# Patient Record
Sex: Female | Born: 1955 | Race: White | Hispanic: No | Marital: Married | Smoking: Never smoker
Health system: Southern US, Community
[De-identification: ages and names within clinical notes are randomized; demographics above are authoritative.]

## PROBLEM LIST (undated history)

## (undated) DIAGNOSIS — A4902 Methicillin resistant Staphylococcus aureus infection, unspecified site: Secondary | ICD-10-CM

## (undated) DIAGNOSIS — F32A Depression, unspecified: Secondary | ICD-10-CM

## (undated) DIAGNOSIS — R079 Chest pain, unspecified: Secondary | ICD-10-CM

## (undated) DIAGNOSIS — I10 Essential (primary) hypertension: Secondary | ICD-10-CM

## (undated) DIAGNOSIS — M47812 Spondylosis without myelopathy or radiculopathy, cervical region: Secondary | ICD-10-CM

## (undated) DIAGNOSIS — I209 Angina pectoris, unspecified: Secondary | ICD-10-CM

## (undated) DIAGNOSIS — E785 Hyperlipidemia, unspecified: Secondary | ICD-10-CM

## (undated) DIAGNOSIS — R519 Headache, unspecified: Secondary | ICD-10-CM

## (undated) DIAGNOSIS — B029 Zoster without complications: Secondary | ICD-10-CM

## (undated) DIAGNOSIS — J329 Chronic sinusitis, unspecified: Secondary | ICD-10-CM

## (undated) DIAGNOSIS — E079 Disorder of thyroid, unspecified: Secondary | ICD-10-CM

## (undated) DIAGNOSIS — T7840XA Allergy, unspecified, initial encounter: Secondary | ICD-10-CM

## (undated) DIAGNOSIS — E039 Hypothyroidism, unspecified: Secondary | ICD-10-CM

## (undated) DIAGNOSIS — K219 Gastro-esophageal reflux disease without esophagitis: Secondary | ICD-10-CM

## (undated) DIAGNOSIS — R011 Cardiac murmur, unspecified: Secondary | ICD-10-CM

## (undated) DIAGNOSIS — F419 Anxiety disorder, unspecified: Secondary | ICD-10-CM

## (undated) HISTORY — DX: Zoster without complications: B02.9

## (undated) HISTORY — DX: Spondylosis without myelopathy or radiculopathy, cervical region: M47.812

## (undated) HISTORY — DX: Essential (primary) hypertension: I10

## (undated) HISTORY — PX: OTHER SURGICAL HISTORY: SHX169

## (undated) HISTORY — DX: Disorder of thyroid, unspecified: E07.9

## (undated) HISTORY — DX: Allergy, unspecified, initial encounter: T78.40XA

## (undated) HISTORY — DX: Cardiac murmur, unspecified: R01.1

## (undated) HISTORY — DX: Chronic sinusitis, unspecified: J32.9

---

## 2001-06-16 HISTORY — PX: OTHER SURGICAL HISTORY: SHX169

## 2002-06-16 HISTORY — PX: SPINE SURGERY: SHX786

## 2002-07-18 ENCOUNTER — Encounter: Payer: Self-pay | Admitting: Obstetrics and Gynecology

## 2002-07-18 ENCOUNTER — Ambulatory Visit (HOSPITAL_COMMUNITY): Admission: RE | Admit: 2002-07-18 | Discharge: 2002-07-18 | Payer: Self-pay | Admitting: Obstetrics and Gynecology

## 2002-11-23 ENCOUNTER — Emergency Department (HOSPITAL_COMMUNITY): Admission: EM | Admit: 2002-11-23 | Discharge: 2002-11-23 | Payer: Self-pay | Admitting: Emergency Medicine

## 2003-02-05 ENCOUNTER — Emergency Department (HOSPITAL_COMMUNITY): Admission: EM | Admit: 2003-02-05 | Discharge: 2003-02-05 | Payer: Self-pay | Admitting: Emergency Medicine

## 2003-02-05 ENCOUNTER — Encounter: Payer: Self-pay | Admitting: Emergency Medicine

## 2003-03-02 ENCOUNTER — Encounter: Payer: Self-pay | Admitting: Orthopedic Surgery

## 2003-03-02 ENCOUNTER — Ambulatory Visit (HOSPITAL_COMMUNITY): Admission: RE | Admit: 2003-03-02 | Discharge: 2003-03-02 | Payer: Self-pay | Admitting: Orthopedic Surgery

## 2003-04-17 ENCOUNTER — Inpatient Hospital Stay (HOSPITAL_COMMUNITY): Admission: RE | Admit: 2003-04-17 | Discharge: 2003-04-18 | Payer: Self-pay | Admitting: Neurosurgery

## 2003-05-03 ENCOUNTER — Other Ambulatory Visit: Admission: RE | Admit: 2003-05-03 | Discharge: 2003-05-03 | Payer: Self-pay | Admitting: Obstetrics and Gynecology

## 2003-06-17 HISTORY — PX: CARDIAC CATHETERIZATION: SHX172

## 2003-07-26 ENCOUNTER — Encounter: Admission: RE | Admit: 2003-07-26 | Discharge: 2003-07-26 | Payer: Self-pay | Admitting: Infectious Diseases

## 2003-10-11 ENCOUNTER — Encounter: Admission: RE | Admit: 2003-10-11 | Discharge: 2003-10-11 | Payer: Self-pay | Admitting: Infectious Diseases

## 2003-10-15 ENCOUNTER — Ambulatory Visit (HOSPITAL_COMMUNITY): Admission: RE | Admit: 2003-10-15 | Discharge: 2003-10-15 | Payer: Self-pay | Admitting: Orthopedic Surgery

## 2004-01-08 DIAGNOSIS — C4492 Squamous cell carcinoma of skin, unspecified: Secondary | ICD-10-CM

## 2004-01-08 HISTORY — DX: Squamous cell carcinoma of skin, unspecified: C44.92

## 2004-02-08 ENCOUNTER — Inpatient Hospital Stay (HOSPITAL_COMMUNITY): Admission: EM | Admit: 2004-02-08 | Discharge: 2004-02-09 | Payer: Self-pay | Admitting: *Deleted

## 2004-02-12 ENCOUNTER — Encounter (INDEPENDENT_AMBULATORY_CARE_PROVIDER_SITE_OTHER): Payer: Self-pay | Admitting: *Deleted

## 2004-02-29 ENCOUNTER — Ambulatory Visit (HOSPITAL_COMMUNITY): Admission: RE | Admit: 2004-02-29 | Discharge: 2004-02-29 | Payer: Self-pay | Admitting: Obstetrics and Gynecology

## 2004-02-29 ENCOUNTER — Encounter (INDEPENDENT_AMBULATORY_CARE_PROVIDER_SITE_OTHER): Payer: Self-pay | Admitting: Specialist

## 2004-08-13 ENCOUNTER — Other Ambulatory Visit: Admission: RE | Admit: 2004-08-13 | Discharge: 2004-08-13 | Payer: Self-pay | Admitting: Obstetrics and Gynecology

## 2005-03-03 ENCOUNTER — Ambulatory Visit (HOSPITAL_COMMUNITY): Admission: RE | Admit: 2005-03-03 | Discharge: 2005-03-03 | Payer: Self-pay | Admitting: Emergency Medicine

## 2005-06-16 HISTORY — PX: OVARY SURGERY: SHX727

## 2005-09-30 ENCOUNTER — Ambulatory Visit (HOSPITAL_COMMUNITY): Admission: RE | Admit: 2005-09-30 | Discharge: 2005-09-30 | Payer: Self-pay | Admitting: Neurosurgery

## 2005-10-01 ENCOUNTER — Encounter: Admission: RE | Admit: 2005-10-01 | Discharge: 2005-10-01 | Payer: Self-pay | Admitting: Occupational Medicine

## 2006-02-12 ENCOUNTER — Emergency Department (HOSPITAL_COMMUNITY): Admission: EM | Admit: 2006-02-12 | Discharge: 2006-02-12 | Payer: Self-pay | Admitting: Emergency Medicine

## 2006-06-29 ENCOUNTER — Ambulatory Visit (HOSPITAL_COMMUNITY): Admission: RE | Admit: 2006-06-29 | Discharge: 2006-06-29 | Payer: Self-pay | Admitting: Emergency Medicine

## 2007-01-24 ENCOUNTER — Emergency Department (HOSPITAL_COMMUNITY): Admission: EM | Admit: 2007-01-24 | Discharge: 2007-01-24 | Payer: Self-pay | Admitting: Emergency Medicine

## 2007-04-26 ENCOUNTER — Ambulatory Visit (HOSPITAL_COMMUNITY): Admission: RE | Admit: 2007-04-26 | Discharge: 2007-04-26 | Payer: Self-pay | Admitting: Family Medicine

## 2007-04-26 ENCOUNTER — Emergency Department (HOSPITAL_COMMUNITY): Admission: EM | Admit: 2007-04-26 | Discharge: 2007-04-26 | Payer: Self-pay | Admitting: Emergency Medicine

## 2007-05-30 ENCOUNTER — Ambulatory Visit (HOSPITAL_COMMUNITY): Admission: RE | Admit: 2007-05-30 | Discharge: 2007-05-30 | Payer: Self-pay | Admitting: Otolaryngology

## 2007-07-22 DIAGNOSIS — D229 Melanocytic nevi, unspecified: Secondary | ICD-10-CM

## 2007-07-22 HISTORY — DX: Melanocytic nevi, unspecified: D22.9

## 2007-09-14 ENCOUNTER — Emergency Department (HOSPITAL_COMMUNITY): Admission: EM | Admit: 2007-09-14 | Discharge: 2007-09-14 | Payer: Self-pay | Admitting: Emergency Medicine

## 2007-12-07 ENCOUNTER — Emergency Department (HOSPITAL_COMMUNITY): Admission: EM | Admit: 2007-12-07 | Discharge: 2007-12-08 | Payer: Self-pay | Admitting: Emergency Medicine

## 2007-12-08 ENCOUNTER — Encounter (INDEPENDENT_AMBULATORY_CARE_PROVIDER_SITE_OTHER): Payer: Self-pay | Admitting: *Deleted

## 2009-02-13 ENCOUNTER — Encounter: Payer: Self-pay | Admitting: Internal Medicine

## 2009-04-04 ENCOUNTER — Emergency Department (HOSPITAL_COMMUNITY): Admission: EM | Admit: 2009-04-04 | Discharge: 2009-04-04 | Payer: Self-pay | Admitting: Family Medicine

## 2009-04-10 ENCOUNTER — Ambulatory Visit (HOSPITAL_COMMUNITY): Admission: RE | Admit: 2009-04-10 | Discharge: 2009-04-10 | Payer: Self-pay | Admitting: Neurosurgery

## 2010-05-21 ENCOUNTER — Ambulatory Visit (HOSPITAL_COMMUNITY)
Admission: RE | Admit: 2010-05-21 | Discharge: 2010-05-21 | Payer: Self-pay | Source: Home / Self Care | Admitting: Neurosurgery

## 2010-07-07 ENCOUNTER — Encounter: Payer: Self-pay | Admitting: Internal Medicine

## 2010-07-07 ENCOUNTER — Encounter: Payer: Self-pay | Admitting: Neurosurgery

## 2010-07-07 ENCOUNTER — Encounter: Payer: Self-pay | Admitting: *Deleted

## 2010-07-15 ENCOUNTER — Ambulatory Visit (HOSPITAL_COMMUNITY)
Admission: RE | Admit: 2010-07-15 | Discharge: 2010-07-15 | Payer: Self-pay | Source: Home / Self Care | Attending: Emergency Medicine | Admitting: Emergency Medicine

## 2010-09-06 ENCOUNTER — Other Ambulatory Visit: Payer: Self-pay | Admitting: Gastroenterology

## 2010-09-06 ENCOUNTER — Ambulatory Visit (HOSPITAL_COMMUNITY)
Admission: RE | Admit: 2010-09-06 | Discharge: 2010-09-06 | Disposition: A | Discharge status: Home / Self Care | Payer: 59 | Source: Ambulatory Visit | Attending: Gastroenterology | Admitting: Gastroenterology

## 2010-09-06 DIAGNOSIS — Z85528 Personal history of other malignant neoplasm of kidney: Secondary | ICD-10-CM | POA: Insufficient documentation

## 2010-09-06 DIAGNOSIS — Z1211 Encounter for screening for malignant neoplasm of colon: Secondary | ICD-10-CM | POA: Insufficient documentation

## 2010-09-06 DIAGNOSIS — K644 Residual hemorrhoidal skin tags: Secondary | ICD-10-CM | POA: Insufficient documentation

## 2010-09-06 DIAGNOSIS — K648 Other hemorrhoids: Secondary | ICD-10-CM | POA: Insufficient documentation

## 2010-09-06 DIAGNOSIS — E785 Hyperlipidemia, unspecified: Secondary | ICD-10-CM | POA: Insufficient documentation

## 2010-09-06 DIAGNOSIS — D126 Benign neoplasm of colon, unspecified: Secondary | ICD-10-CM | POA: Insufficient documentation

## 2010-09-06 DIAGNOSIS — Z79899 Other long term (current) drug therapy: Secondary | ICD-10-CM | POA: Insufficient documentation

## 2010-09-06 DIAGNOSIS — Z87828 Personal history of other (healed) physical injury and trauma: Secondary | ICD-10-CM | POA: Insufficient documentation

## 2010-09-06 DIAGNOSIS — Z880 Allergy status to penicillin: Secondary | ICD-10-CM | POA: Insufficient documentation

## 2010-09-06 DIAGNOSIS — Z7982 Long term (current) use of aspirin: Secondary | ICD-10-CM | POA: Insufficient documentation

## 2010-09-06 DIAGNOSIS — I10 Essential (primary) hypertension: Secondary | ICD-10-CM | POA: Insufficient documentation

## 2010-09-19 LAB — RHEUMATOID FACTOR: Rhuematoid fact SerPl-aCnc: <20 IU/mL (ref 0–20)

## 2010-09-19 LAB — CBC
HCT: 42.5 % (ref 36.0–46.0)
Hemoglobin: 14.7 g/dL (ref 12.0–15.0)
MCHC: 34.7 g/dL (ref 30.0–36.0)
MCV: 90 fL (ref 78.0–100.0)
Platelets: 311 10*3/uL (ref 150–400)
RBC: 4.72 MIL/uL (ref 3.87–5.11)
RDW: 13 % (ref 11.5–15.5)
WBC: 8.4 10*3/uL (ref 4.0–10.5)

## 2010-09-19 LAB — COMPREHENSIVE METABOLIC PANEL
ALT: 22 U/L (ref 0–35)
AST: 26 U/L (ref 0–37)
Albumin: 5.2 g/dL (ref 3.5–5.2)
Alkaline Phosphatase: 80 U/L (ref 39–117)
BUN: 16 mg/dL (ref 6–23)
CO2: 32 mEq/L (ref 19–32)
Calcium: 10.2 mg/dL (ref 8.4–10.5)
Chloride: 96 mEq/L (ref 96–112)
Creatinine, Ser: 1.19 mg/dL (ref 0.4–1.2)
GFR calc Af Amer: 58 mL/min — ABNORMAL LOW (ref >60)
GFR calc non Af Amer: 48 mL/min — ABNORMAL LOW (ref >60)
Glucose, Bld: 97 mg/dL (ref 70–99)
Potassium: 3.1 mEq/L — ABNORMAL LOW (ref 3.5–5.1)
Sodium: 140 mEq/L (ref 135–145)
Total Bilirubin: 0.7 mg/dL (ref 0.3–1.2)
Total Protein: 8.4 g/dL — ABNORMAL HIGH (ref 6.0–8.3)

## 2010-09-19 LAB — SEDIMENTATION RATE: Sed Rate: 6 mm/hr (ref 0–22)

## 2010-09-19 LAB — RPR: RPR Ser Ql: NONREACTIVE

## 2010-09-19 LAB — URIC ACID: Uric Acid, Serum: 6.9 mg/dL (ref 2.4–7.0)

## 2010-11-01 NOTE — Discharge Summary (Signed)
NAME:  Amber Werner, Amber Werner                        ACCOUNT NO.:  [id number]   MEDICAL RECORD NO.:  [id number]                   PATIENT TYPE:  INP   LOCATION:  3731                                 FACILITY:  [facility]   PHYSICIAN:  Thereasa Solo. Little, M.D.              DATE OF BIRTH:  06/12/1956   DATE OF ADMISSION:  02/08/2004  DATE OF DISCHARGE:  02/09/2004                                 DISCHARGE SUMMARY   ADMISSION DIAGNOSES:  1. Chest pain of questionable etiology.  2. History of negative Cardiolite in October of 2004.  3. 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  4. Hyperlipidemia with statin intolerance.  5. History of hypothyroidism.  6. Hypertension.  7. Chronic sinusitis.   DISCHARGE DIAGNOSES:  1. Chest pain of questionable etiology.  2. History of negative Cardiolite in October of 2004.  3. 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  4. Hyperlipidemia with statin intolerance.  5. History of hypothyroidism.  6. Hypertension.  7. Chronic sinusitis.  8. Status post cardiac catheterization on February 09, 2004 revealing no     significant coronary artery disease.   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  She follows with Dr.  Clarene Duke, and has a history of a negative Cardiolite in October of 2004, but  has continued to have intermittent chest pain.  She does have positive risk  factors for hyperlipidemia with statin intolerance, as well as some  hypertension.  As well, she occasionally smokes.  She was seen in the ER on  February 08, 2004 because of chest pain, which had been intermittent for 2  weeks, lasting 5-10 minutes each episode, and was worse with stress.  There  was some radiation to the left arm and neck.  She had been at her OB/GYN,  Dr. Rana Snare, earlier that day, and was sent from his office to the ER for  further evaluation.   PHYSICAL EXAMINATION:  VITAL  SIGNS:  Blood pressure was initially elevated  at 164/74, pulse 70.  HEART:  Regular rhythm without murmur, rub, or gallop.  There was no other significant abnormality on physical exam.  She was  evaluated by Dr. Jacinto Halim, who felt the physical exam was unremarkable.   EKG was without significant abnormality.  At that point, though, given her  multiple risk factors, her ongoing intermittent chest pain despite negative  Cardiolite, it was felt that we should proceed with definitive cardiac  catheterization.  She was placed on heparin, nitroglycerin, until we would  obtain a cath.  We would check serial enzymes, and follow up EKG.   On the morning of February 09, 2004, she was rounded on by Dr. Susa Griffins.   HOSPITAL COURSE:  On the morning of February 09, 2004, she was seen by Dr.  Susa Griffins on morning  rounds.  It was felt that her chest pain was  very atypical, and it was felt that she had a lot of anxiety.  Nonetheless,  we plan to go ahead and proceed with definitive cath, given her history of  Cardiolite and ongoing pain.  At that point, her enzymes were negative x2.  D-dimer is negative.  __________ the labs looked good.   Later on in the day of February 09, 2004, she underwent cardiac  catheterization by Dr. Yates Decamp.  She had no significant CAD.  She did have  some catheter-induced spasm of the RCA relieved with IV nitroglycerin.  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  The EF was 50%.  She tolerated the procedure without  complications.   She was discharged home later than afternoon.  At that point, she was  hemodynamically stable.  The groin was stable without hematoma or bleed.   HOSPITAL CONSULTS:  None.   HOSPITAL PROCEDURES:  Cardiac catheterization on February 09, 2004 by Dr. Yates Decamp.  She was found to have normal coronary arteries.  She did have some  catheter-induced spasm of the RCA, which was  relieved with IV nitroglycerin.  As well, she had some slow filling of the coronary arteries.   LABORATORY DATA:  On February 09, 2004, the white count was 8.2, hemoglobin  12.9, hematocrit 37.2, platelets 349.  Urinalysis is normal.  TSH normal at  2.721, INR of 0.9, D-dimer 0.29.  Sodium was 139, potassium 3.6, BUN 11,  creatinine 1.1.  LFT's are normal.  Cardiac enzymes are negative x2 with  CK's of 126, 97, CK-MB 1.3, 0.9.  Troponin is 0.01 x2.   RADIOLOGY:  The printed out report is not attached to the chart at this time  of dictation, and is pending.   EKG:  Normal sinus rhythm, 66 beats per minute.  No significant abnormality.  No ST-T change.   DISCHARGE MEDICATIONS:  1. Hyzaar 50/12.5 mg once a day.  She is to stop this.  2. Synthroid 0.125 mg once a day.  3. Zyrtec as before.  4. Singulair as before.  5. Zithromax 250 mg once a day for 3 more days.  6. Norvasc 2.5 mg a day, which Dr. Jacinto Halim started new.  7. Cozaar 25 mg one a day at night.  (Again, she was told to stop her Hyzaar.  Dr. Jacinto Halim made these medication  changes, based on her catheterization findings, and possible microvascular  disease, and catheter-induced spasm.)   ACTIVITY:  No strenuous activity, lifting greater than 5 pounds, driving, or  sexual activity for 3 days.   DIET:  Low cholesterol diet.   WOUND CARE:  Cleanse groin site with warm water and soap.   Call [phone] for any bleeding or increased redness in the groin site.   FOLLOW UP:  Follow up with Dr. Clarene Duke on March 20, 2004 at 9:15 a.m.      Mary B. Easley, P.A.-C.                   Thereasa Solo. Little, M.D.    MBE/MEDQ  D:  02/12/2004  T:  02/12/2004  Job:  [id number]

## 2010-11-01 NOTE — Op Note (Signed)
NAME:  Amber Werner, Amber Werner                        ACCOUNT NO.:  [id number]   MEDICAL RECORD NO.:  [id number]                   PATIENT TYPE:  INP   LOCATION:  3021                                 FACILITY:  [facility]   PHYSICIAN:  Cristi Loron, M.D.            DATE OF BIRTH:  1955-11-14   DATE OF PROCEDURE:  04/18/2003  DATE OF DISCHARGE:                                 OPERATIVE REPORT   PREOPERATIVE DIAGNOSES:  C5-6 and C6-7 herniated nucleus pulposus,  spondylosis, stenosis, cervical radiculopathy, cervicalgia.   POSTOPERATIVE DIAGNOSES:  C5-6 and C6-7 herniated nucleus pulposus,  spondylosis, stenosis, cervical radiculopathy, cervicalgia.   PROCEDURES:  1. C5-6 and C6-7 extensive anterior cervical diskectomy/decompression.  2. Interbody iliac crest allograft arthrodesis.  3. Anterior cervical plating (Synthes titanium plate and screws).   SURGEON:  Cristi Loron, M.D.   ASSISTANT:  Payton Doughty, M.D.   ANESTHESIA:  General endotracheal.   ESTIMATED BLOOD LOSS:  150 mL.   SPECIMENS:  None.   DRAINS:  None.   COMPLICATIONS:  None.   BRIEF HISTORY:  The patient is a 55 year old white female who has suffered  from neck and right arm pain.  She failed medical management and was worked  up with a cervical MRI, which demonstrated a herniated disk and spondylosis  at C5-6 and C6-7.  I discussed the various treatment options with her,  including surgery.  The patient weighed the risks, benefits, and  alternatives of surgery and decided to proceed with a C5-6 and C6-7 anterior  cervical diskectomy, fusion, and plating.   DESCRIPTION OF PROCEDURE:  The patient was brought to the operating room by  the anesthesia team.  General endotracheal anesthesia was induced.  The  patient remained in the supine position.  A roll was placed under her  shoulders to place her neck in slight extension.  We then prepared the  anterior cervical region using Betadine scrub with Betadine  solution.  Sterile drapes were applied.  I then injected the area to be incised with  Marcaine with epinephrine solution and used a scalpel to make a transverse  incision in the patient's left anterior neck.  I used the Metzenbaum  scissors to divide the patient's platysma muscle and then to dissect medial  to the sternocleidomastoid muscle, jugular vein, and carotid artery.  I  bluntly dissected down toward the anterior cervical spine and carefully  identified the esophagus and retracted it medially.  I then cleared soft  tissue from the anterior cervical spine using Kitner swabs and then inserted  a bent spinal needle into the upper exposed interspace.  I then obtained the  intraoperative radiograph to confirm our location.   We began at C5-6.  We incised the C5-6 intervertebral disk space and then  performed a partial diskectomy using the pituitary forceps and the Karlin  curettes.  We inserted distraction screws at C5 and C6 and then distracted  the interspace.  The interspace was quite spondylitic and narrow.  We used a  high-speed drill to decorticate the vertebral end plates at Z5-6, to drill  away the remainder of the C5-6 intervertebral disk, to thin out the  posterior longitudinal ligament, and to drill away some posterior  spondylosis.  We then incised the thinned ligament with an arachnoid knife  and then removed it with the Kerrison punch, undercutting the vertebral end  plates at L8-7, decompressing the thecal sac, and then performed a  foraminotomy about the bilateral C6 nerve roots, completing the  decompression at this level.   We then repeated this procedure in an analogous fashion at C6-7, performing  our decompression at this level and then decompressing the thecal sac and  bilateral C7 nerve roots.   Having completed the decompression, we now turned our attention to  arthrodesis.  We obtained iliac crest tricortical allograft bone graft and  fashioned it to these  approximate dimensions, 7 mm in height and 1 cm in  depth.  We inserted one bone graft into the distracted C6-7 interspace and  then removed the distraction screw from C7, placed it back into C5,  distracted the C5-6 interspace, and then placed a second bone graft in the  C5-6 interspace.  We removed the distraction screws.  There was a good snug  fit of the bone graft at both levels.   We now turned our attention to the anterior spinal instrumentation.  We  drilled away some ventral spondylosis so that the plate would lay down flat  and then obtained the appropriate length Synthes titanium plate.  We laid it  along the anterior aspect of the vertebral bodies from C5 to C7.  We drilled  two holes in C5, C6, and C7, tapped the holes, and then secured the plate to  the vertebral bodies by placing two 12 mm screws at C5, C6, and C7.  We then  obtained the intraoperative that demonstrated good position of the plate,  screws, and interbody graft, and then secured the screws to the plate by  placing a locking screw at each screw.   We then obtained stringent hemostasis using bipolar electrocautery and  copiously irrigated the wound out with bacitracin solution.  We removed the  solution, then removed the Caspar self-retaining retractor.  We then  inspected the esophagus for any damage and there was none apparent.  We then  reapproximated the patient's platysma muscle with interrupted 3-0 Vicryl  suture, the subcutaneous tissue with interrupted 3-0 Vicryl suture, and the  skin with Steri-Strips and Benzoin.  The wound was then coated with  bacitracin ointment, a sterile dressing was applied, the drapes were  removed, and the patient was subsequently extubated by the anesthesia team  and transported to the postanesthesia care unit in stable condition.  All  sponge, instrument, and needle counts were correct at the end of this case.                                              Cristi Loron, M.D.    JDJ/MEDQ  D:  04/17/2003  T:  04/18/2003  Job:  [id number]

## 2010-11-01 NOTE — Op Note (Signed)
NAME:  Amber Werner, Amber Werner                        ACCOUNT NO.:  [id number]   MEDICAL RECORD NO.:  [id number]                   PATIENT TYPE:  AMB   LOCATION:  SDC                                  FACILITY:  [facility]   PHYSICIAN:  Dineen Kid. Rana Snare, M.D.                 DATE OF BIRTH:  12-25-1955   DATE OF PROCEDURE:  02/29/2004  DATE OF DISCHARGE:                                 OPERATIVE REPORT   PREOPERATIVE DIAGNOSIS:  Left lower quadrant pain and history of  endometriosis.   POSTOPERATIVE DIAGNOSIS:  Left lower quadrant pain, endometriosis and pelvic  adhesions.   OPERATION PERFORMED:  Laparoscopy with lysis of adhesions, left salpingo-  oophorectomy and ablation of endometriosis implants.   SURGEON:  Dineen Kid. Rana Snare, M.D.   ANESTHESIA:  General endotracheal.   INDICATIONS FOR PROCEDURE:  Ms. Taddeo is a 54 year old G3, P2, A1 with  worsening left lower quadrant pain.  The pain has gotten so severe it starts  radiating down her left anterior thigh.  She does have a remote history of  endometriosis.  She had an ovarian cyst approximately six months ago and  recent ultrasound shows a question of hydrosalpinx.  Because the pain is  disabling, she is currently on Vicodin for this, she desires to get a  surgical evaluation and treatment of this.  Plan laparoscopy with removal of  the left tube and ovary and possible ablation of endometriosis implants.  Risks and benefits were discussed at length, informed consent was obtained.  See history and physical for further details.   FINDINGS AT TIME OF SURGERY:  Normal-appearing liver, appendix.  Normal-  appearing right tube, ovary.  Normal-appearing cul-de-sac and uterus.  The  left ovarian fossa has several small endometrious implants.  The left ovary  and left fallopian tube are densely adhered with the fallopian tube flipped  over the top and adheres with the fimbriated, adhered to the round ligament  where the midportion of the tube is  adhered to the pelvic sidewall.   DESCRIPTION OF PROCEDURE:  After adequate analgesia, the patient was placed  in dorsal lithotomy position.  She was sterilely prepped and draped.  The  bladder was sterilely drained.  A Hulka tenaculum was placed on the cervix.  A 1 cm infraumbilical skin incision was made.  A Veress needle was inserted.  The abdomen was insufflated with dullness to percussion.  A 5 mm trocar was  inserted to the left of the midline and an 11 mm trocar was inserted through  the umbilical incision.  The above findings were noted.  A Gyrus tripolar  LigaSure was used to dissect the adhesions from the left pelvic sidewall and  descending colon, dissecting this from the sidewall across the mesosalpinx  through the round ligament with this dissected free from the round ligament.  The infundibulopelvic ligament was ligated using bipolar cautery and  dissected the ovary from  the infundibulopelvic ligament.  The utero-ovarian  ligament was then dissected down across the fallopian tube removing the  fallopian tube and the ovary with good hemostasis achieved and all of the  adhesions from the tube and ovary also removed.  Endometrious implants in  the left ovarian fossa were then cauterized using the bipolar cautery with  good thermal burn noted through the entirety of the peritoneal window.  This  was performed until no endometrious implants were remaining.  After  irrigation was applied and re-examination of the pedicles and endometrious  implants, they all appeared to be hemostatic and no residual disease was  noted. The ovary was removed through the umbilical incision after  morselization.  The trocar was then removed.  The infraumbilical skin  incision was closed with a 0 Vicryl interrupted suture in the fascia, 3-0  Vicryl Rapide subcuticular stitch.  The 5 mm site was closed with a 3-0  Vicryl Rapide subcuticular stitch and horizontal mattress.  The incisions  were injected  with 0.25% Marcaine, 10 mL total used.  The tenaculum removed  from the cervix and noted to be hemostatic. The patient was transferred to  the recovery room in stable condition.  Sponge, needle and instrument counts  were normal.  The estimated blood loss was less than 10 mL.  The patient did  receive Cipro 400 mg IV preoperatively.   DISPOSITION:  The patient will be discharged to home with routine  instruction sheet for laparoscopy.  She is sent home with a prescription for  Vicodin #30 and told to return for increased pain, fever or bleeding.  She  will follow up in the office in two to three weeks.                                               Dineen Kid Rana Snare, M.D.    DCL/MEDQ  D:  02/29/2004  T:  02/29/2004  Job:  [id number]

## 2010-11-01 NOTE — H&P (Signed)
NAME:  GIFT, RUECKERT                        ACCOUNT NO.:  [id number]   MEDICAL RECORD NO.:  [id number]                   PATIENT TYPE:  AMB   LOCATION:  SDC                                  FACILITY:  [facility]   PHYSICIAN:  Dineen Kid. Rana Snare, M.D.                 DATE OF BIRTH:  15-Jan-1956   DATE OF ADMISSION:  02/29/2004  DATE OF DISCHARGE:                                HISTORY & PHYSICAL   HISTORY OF PRESENT ILLNESS:  Ms. Southgate is a 55 year old G3, P2, A1, with  left lower quadrant pain and pressure.  She has been working in the  emergency room recently and states the pain is getting very severe, has pain  radiating down her left anterior thigh more in the obturator region.  She  does not have a guarding or rebound or anorexia.  She does have a strong  family history of heart attack and is currently on Hyzaar and Lasix and just  recently did have an EKG in the emergency room which showed some ischemic  changes and has followed up with him for that.  An ultrasound shows no  uterine masses, no fluid in the cul-de-sac.  Left ovary has no adnexal  masses as well; however, there is a questionable left hydrosalpinx  approximately 6 mm in size.  The right ovary is also within normal limits.  She also has a history of an ovarian cystectomy and endometriosis at 55  years of age during laparoscopy.   PAST MEDICAL HISTORY:  1.  Significant for hypertension.  2.  Heart disease.  3.  Hypothyroidism.  4.  Anxiety.   ALLERGIES:  She has allergies to PENICILLIN which cause anaphylaxis  reaction.   MEDICATIONS:  1.  Currently she is on Vicodin.  2.  Synthroid 125.  3.  Hyzaar 12.5/50.  4.  Lasix at 40 mg.  5.  Diazepam as needed.  6.  Percocet as needed.  7.  Prevacid as needed.   PHYSICAL EXAMINATION:  VITAL SIGNS:  Blood pressure is 132/78, heart regular  rate and rhythm.  LUNGS:  Clear to auscultation bilaterally.  ABDOMEN:  Nondistended, nontender.  No rebound, no guarding.  PELVIC:   Uterus is anteverted and mobile, nontender.  Some mild tenderness  in the left adnexal area.  No uterosacral nodularity is palpable.   LABORATORY DATA:  Ultrasound from six months ago showed a left ovarian  hemorrhagic cyst.  Recent ultrasound in August shows left hydrosalpinx and  no fluid in the cul-de-sac.   IMPRESSION/PLAN:  Left lower quadrant pain, questionable hydrosalpinx versus  endometriosis.  The patient does have a history of endometriosis.  Because  the pain is more severe and getting worse, I have recommended surgical  evaluation of this.  The patient absolutely agrees and would like to proceed  as soon as possible.  I gave her a prescription for Vicodin to take as  needed.  We discussed the risks and benefits of surgery at length.  We plan  laparoscopic left salpingo-oophorectomy with possible lysis of adhesions or  ablation of endometriosis.  __________ at the time of surgery, if there is a  significant amount of pelvic disease, we may close her and recommend  hysterectomy and schedule that at a later time.  We did discuss the risks  and benefits of surgery at length, which include, but are not limited to,  risks of infection, bleeding, damage to uterus, tubes, ovaries, bowel,  bladder, possibility of not being to alleviate the pain, worsening of the  pain, or risks associated with general anesthesia or blood transfusions.  She does give her informed consent.  She is menopausal based upon blood  tests on January 10, 2004.  She is currently not on hormone replacement therapy  and really not a good candidate for this.                                               Dineen Kid Rana Snare, M.D.    DCL/MEDQ  D:  02/28/2004  T:  02/28/2004  Job:  [id number]

## 2011-01-06 ENCOUNTER — Other Ambulatory Visit (HOSPITAL_COMMUNITY): Payer: Self-pay | Admitting: Neurosurgery

## 2011-01-06 DIAGNOSIS — R51 Headache: Secondary | ICD-10-CM

## 2011-01-09 ENCOUNTER — Other Ambulatory Visit (HOSPITAL_COMMUNITY): Payer: 59

## 2011-01-17 ENCOUNTER — Other Ambulatory Visit (HOSPITAL_COMMUNITY): Payer: 59

## 2011-03-07 ENCOUNTER — Other Ambulatory Visit (HOSPITAL_COMMUNITY): Payer: Self-pay | Admitting: Family Medicine

## 2011-03-07 DIAGNOSIS — R1011 Right upper quadrant pain: Secondary | ICD-10-CM

## 2011-03-10 LAB — DIFFERENTIAL
Basophils Absolute: 0
Basophils Relative: 0
Eosinophils Absolute: 0.3
Eosinophils Relative: 4
Lymphocytes Relative: 39
Lymphs Abs: 3.1
Monocytes Absolute: 0.7
Monocytes Relative: 9
Neutro Abs: 3.8
Neutrophils Relative %: 48

## 2011-03-10 LAB — BASIC METABOLIC PANEL
BUN: 14
CO2: 25
Calcium: 9.2
Chloride: 104
Creatinine, Ser: 0.93
GFR calc Af Amer: >60
GFR calc non Af Amer: >60
Glucose, Bld: 81
Potassium: 4.5
Sodium: 138

## 2011-03-10 LAB — CBC
HCT: 37.5
Hemoglobin: 13
MCHC: 34.8
MCV: 87.3
Platelets: 276
RBC: 4.3
RDW: 13.4
WBC: 7.9

## 2011-03-19 ENCOUNTER — Ambulatory Visit (HOSPITAL_COMMUNITY)
Admission: RE | Admit: 2011-03-19 | Discharge: 2011-03-19 | Disposition: A | Discharge status: Home / Self Care | Payer: 59 | Source: Ambulatory Visit | Attending: Family Medicine | Admitting: Family Medicine

## 2011-03-19 DIAGNOSIS — R1011 Right upper quadrant pain: Secondary | ICD-10-CM | POA: Insufficient documentation

## 2011-07-16 ENCOUNTER — Other Ambulatory Visit: Payer: Self-pay

## 2011-09-12 ENCOUNTER — Other Ambulatory Visit: Payer: Self-pay | Admitting: Physician Assistant

## 2011-09-12 ENCOUNTER — Telehealth: Payer: Self-pay

## 2011-09-12 DIAGNOSIS — I10 Essential (primary) hypertension: Secondary | ICD-10-CM

## 2011-09-12 DIAGNOSIS — E039 Hypothyroidism, unspecified: Secondary | ICD-10-CM

## 2011-09-12 DIAGNOSIS — J309 Allergic rhinitis, unspecified: Secondary | ICD-10-CM

## 2011-09-12 DIAGNOSIS — F419 Anxiety disorder, unspecified: Secondary | ICD-10-CM

## 2011-09-12 DIAGNOSIS — F430 Acute stress reaction: Secondary | ICD-10-CM

## 2011-09-12 MED ORDER — LEVOTHYROXINE SODIUM 150 MCG PO TABS: 150.0000 ug | ORAL_TABLET | Freq: Every day | ORAL | Status: DC

## 2011-09-12 MED ORDER — FLUTICASONE PROPIONATE 50 MCG/ACT NA SUSP: 2.0000 | Freq: Every day | NASAL | Status: DC

## 2011-09-12 MED ORDER — ZOLPIDEM TARTRATE 10 MG PO TABS: 10.0000 mg | ORAL_TABLET | Freq: Every evening | ORAL | Status: DC | PRN

## 2011-09-12 MED ORDER — MONTELUKAST SODIUM 10 MG PO TABS: 10.0000 mg | ORAL_TABLET | Freq: Every day | ORAL | Status: DC

## 2011-09-12 MED ORDER — FUROSEMIDE 40 MG PO TABS: 40.0000 mg | ORAL_TABLET | Freq: Every day | ORAL | Status: DC

## 2011-09-12 MED ORDER — ALPRAZOLAM 0.25 MG PO TABS: 0.2500 mg | ORAL_TABLET | Freq: Two times a day (BID) | ORAL | Status: AC | PRN

## 2011-09-12 MED ORDER — LOSARTAN POTASSIUM-HCTZ 50-12.5 MG PO TABS: 1.0000 | ORAL_TABLET | Freq: Every day | ORAL | Status: DC

## 2011-09-12 NOTE — Telephone Encounter (Signed)
Pt called and states [facility] pharmacy has faxed over refill requests for her meds three times. Chelle refilled meds for one month and pt transferred to appts to make appt with Dr. Audria Nine.

## 2011-09-15 ENCOUNTER — Other Ambulatory Visit: Payer: Self-pay | Admitting: Family Medicine

## 2011-11-05 ENCOUNTER — Ambulatory Visit (INDEPENDENT_AMBULATORY_CARE_PROVIDER_SITE_OTHER): Payer: 59 | Admitting: Family Medicine

## 2011-11-05 ENCOUNTER — Encounter: Payer: Self-pay | Admitting: Family Medicine

## 2011-11-05 VITALS — BP 124/82 | HR 77 | Temp 97.8°F | Resp 16 | Ht 63.5 in | Wt 159.0 lb

## 2011-11-05 DIAGNOSIS — J309 Allergic rhinitis, unspecified: Secondary | ICD-10-CM

## 2011-11-05 DIAGNOSIS — G4700 Insomnia, unspecified: Secondary | ICD-10-CM

## 2011-11-05 DIAGNOSIS — I10 Essential (primary) hypertension: Secondary | ICD-10-CM

## 2011-11-05 DIAGNOSIS — Z13220 Encounter for screening for lipoid disorders: Secondary | ICD-10-CM

## 2011-11-05 DIAGNOSIS — E039 Hypothyroidism, unspecified: Secondary | ICD-10-CM

## 2011-11-05 DIAGNOSIS — Z0000 Encounter for general adult medical examination without abnormal findings: Secondary | ICD-10-CM

## 2011-11-05 LAB — COMPREHENSIVE METABOLIC PANEL
ALT: 18 U/L (ref 0–35)
AST: 23 U/L (ref 0–37)
Albumin: 4.7 g/dL (ref 3.5–5.2)
Alkaline Phosphatase: 81 U/L (ref 39–117)
BUN: 17 mg/dL (ref 6–23)
CO2: 30 mEq/L (ref 19–32)
Calcium: 9.8 mg/dL (ref 8.4–10.5)
Chloride: 99 mEq/L (ref 96–112)
Creat: 1.08 mg/dL (ref 0.50–1.10)
Glucose, Bld: 79 mg/dL (ref 70–99)
Potassium: 4 mEq/L (ref 3.5–5.3)
Sodium: 139 mEq/L (ref 135–145)
Total Bilirubin: 0.5 mg/dL (ref 0.3–1.2)
Total Protein: 7.4 g/dL (ref 6.0–8.3)

## 2011-11-05 LAB — LIPID PANEL
Cholesterol: 281 mg/dL — ABNORMAL HIGH (ref 0–200)
HDL: 74 mg/dL (ref >39)
LDL Cholesterol: 181 mg/dL — ABNORMAL HIGH (ref 0–99)
Total CHOL/HDL Ratio: 3.8 Ratio
Triglycerides: 130 mg/dL (ref <150)
VLDL: 26 mg/dL (ref 0–40)

## 2011-11-05 LAB — POCT URINALYSIS DIPSTICK
Bilirubin, UA: NEGATIVE
Blood, UA: NEGATIVE
Glucose, UA: NEGATIVE
Ketones, UA: NEGATIVE
Leukocytes, UA: NEGATIVE
Nitrite, UA: NEGATIVE
Protein, UA: NEGATIVE
Spec Grav, UA: 1.01
Urobilinogen, UA: 0.2
pH, UA: 5

## 2011-11-05 LAB — T4, FREE: Free T4: 1.23 ng/dL (ref 0.80–1.80)

## 2011-11-05 LAB — TSH: TSH: 2.008 u[IU]/mL (ref 0.350–4.500)

## 2011-11-05 MED ORDER — FLUTICASONE PROPIONATE 50 MCG/ACT NA SUSP: NASAL | Status: DC

## 2011-11-05 MED ORDER — FUROSEMIDE 40 MG PO TABS: 40.0000 mg | ORAL_TABLET | Freq: Every day | ORAL | Status: DC

## 2011-11-05 MED ORDER — ESOMEPRAZOLE MAGNESIUM 40 MG PO CPDR: 40.0000 mg | DELAYED_RELEASE_CAPSULE | Freq: Every day | ORAL | Status: DC

## 2011-11-05 MED ORDER — MONTELUKAST SODIUM 10 MG PO TABS: 10.0000 mg | ORAL_TABLET | Freq: Every day | ORAL | Status: DC

## 2011-11-05 MED ORDER — LEVOTHYROXINE SODIUM 150 MCG PO TABS: 150.0000 ug | ORAL_TABLET | Freq: Every day | ORAL | Status: DC

## 2011-11-05 MED ORDER — ZOLPIDEM TARTRATE 10 MG PO TABS: 10.0000 mg | ORAL_TABLET | Freq: Every evening | ORAL | Status: DC | PRN

## 2011-11-05 MED ORDER — LOSARTAN POTASSIUM-HCTZ 50-12.5 MG PO TABS: 1.0000 | ORAL_TABLET | Freq: Every day | ORAL | Status: DC

## 2011-11-05 MED ORDER — DIAZEPAM 5 MG PO TABS: 5.0000 mg | ORAL_TABLET | Freq: Four times a day (QID) | ORAL | Status: DC | PRN

## 2011-11-05 NOTE — Patient Instructions (Signed)

## 2011-11-09 ENCOUNTER — Encounter: Payer: Self-pay | Admitting: Family Medicine

## 2011-11-09 ENCOUNTER — Other Ambulatory Visit: Payer: Self-pay | Admitting: Family Medicine

## 2011-11-09 DIAGNOSIS — E039 Hypothyroidism, unspecified: Secondary | ICD-10-CM | POA: Insufficient documentation

## 2011-11-09 DIAGNOSIS — A4902 Methicillin resistant Staphylococcus aureus infection, unspecified site: Secondary | ICD-10-CM | POA: Insufficient documentation

## 2011-11-09 DIAGNOSIS — G8929 Other chronic pain: Secondary | ICD-10-CM | POA: Insufficient documentation

## 2011-11-09 DIAGNOSIS — J309 Allergic rhinitis, unspecified: Secondary | ICD-10-CM | POA: Insufficient documentation

## 2011-11-09 DIAGNOSIS — I10 Essential (primary) hypertension: Secondary | ICD-10-CM | POA: Insufficient documentation

## 2011-11-09 DIAGNOSIS — M47812 Spondylosis without myelopathy or radiculopathy, cervical region: Secondary | ICD-10-CM | POA: Insufficient documentation

## 2011-11-09 DIAGNOSIS — G4700 Insomnia, unspecified: Secondary | ICD-10-CM | POA: Insufficient documentation

## 2011-11-09 DIAGNOSIS — E7800 Pure hypercholesterolemia, unspecified: Secondary | ICD-10-CM

## 2011-11-09 NOTE — Progress Notes (Signed)
Quick Note:  Please call pt and advise that the following labs are abnormal...  Lipids are very high (total chol= 281 and LDL=181). Pt can resource information about lower cholesterol with better nutrition and get Omega 3 Fisl Oil OTC 1200 mg 1 capsule daily.  I will schedule for her to have lipids rechecked in 10-12 weeks; she needs to fast for 12 hours prior to having these labs drawn.  Thyroid test indicates current medication dose is adequate. Chemistries are normal.  Copy to pt. ______
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
## 2011-11-13 ENCOUNTER — Encounter: Payer: Self-pay | Admitting: Emergency Medicine

## 2011-12-15 ENCOUNTER — Telehealth: Payer: Self-pay

## 2011-12-15 NOTE — Telephone Encounter (Signed)
Please fax labs to [phone]  [facility] Dr. Leodis Binet patient

## 2011-12-15 NOTE — Telephone Encounter (Signed)
Labs faxed via Epic.

## 2012-01-16 ENCOUNTER — Other Ambulatory Visit: Payer: Self-pay | Admitting: *Deleted

## 2012-01-16 MED ORDER — ESOMEPRAZOLE MAGNESIUM 40 MG PO CPDR: 40.0000 mg | DELAYED_RELEASE_CAPSULE | Freq: Every day | ORAL | Status: DC

## 2012-01-29 ENCOUNTER — Other Ambulatory Visit (HOSPITAL_COMMUNITY): Payer: Self-pay | Admitting: Otolaryngology

## 2012-01-29 DIAGNOSIS — R05 Cough: Secondary | ICD-10-CM

## 2012-01-29 DIAGNOSIS — R51 Headache: Secondary | ICD-10-CM

## 2012-01-29 DIAGNOSIS — R059 Cough, unspecified: Secondary | ICD-10-CM

## 2012-02-06 ENCOUNTER — Ambulatory Visit (HOSPITAL_COMMUNITY)
Admission: RE | Admit: 2012-02-06 | Discharge: 2012-02-06 | Disposition: A | Discharge status: Home / Self Care | Payer: 59 | Source: Ambulatory Visit | Attending: Otolaryngology | Admitting: Otolaryngology

## 2012-02-06 ENCOUNTER — Other Ambulatory Visit (HOSPITAL_COMMUNITY): Payer: Self-pay | Admitting: Otolaryngology

## 2012-02-06 DIAGNOSIS — J329 Chronic sinusitis, unspecified: Secondary | ICD-10-CM | POA: Insufficient documentation

## 2012-02-06 DIAGNOSIS — R05 Cough: Secondary | ICD-10-CM

## 2012-02-06 DIAGNOSIS — R059 Cough, unspecified: Secondary | ICD-10-CM

## 2012-02-06 DIAGNOSIS — R51 Headache: Secondary | ICD-10-CM | POA: Insufficient documentation

## 2012-02-06 LAB — BUN: BUN: 18 mg/dL (ref 6–23)

## 2012-02-06 LAB — CREATININE, SERUM
Creatinine, Ser: 0.97 mg/dL (ref 0.50–1.10)
GFR calc Af Amer: 76 mL/min — ABNORMAL LOW (ref >90)
GFR calc non Af Amer: 66 mL/min — ABNORMAL LOW (ref >90)

## 2012-02-06 MED ORDER — GADOBENATE DIMEGLUMINE 529 MG/ML IV SOLN
14.0000 mL | Freq: Once | INTRAVENOUS | Status: AC | PRN
  Administered 2012-02-06: 14 mL via INTRAVENOUS

## 2012-02-20 ENCOUNTER — Encounter: Payer: Self-pay | Admitting: Family Medicine

## 2012-03-22 ENCOUNTER — Encounter: Payer: Self-pay | Admitting: Family Medicine

## 2012-03-24 ENCOUNTER — Ambulatory Visit (INDEPENDENT_AMBULATORY_CARE_PROVIDER_SITE_OTHER): Payer: 59 | Admitting: Family Medicine

## 2012-03-24 VITALS — BP 130/82 | HR 72 | Temp 98.0°F | Resp 16 | Ht 64.0 in | Wt 159.0 lb

## 2012-03-24 DIAGNOSIS — I10 Essential (primary) hypertension: Secondary | ICD-10-CM

## 2012-03-24 DIAGNOSIS — Z760 Encounter for issue of repeat prescription: Secondary | ICD-10-CM

## 2012-03-24 DIAGNOSIS — J309 Allergic rhinitis, unspecified: Secondary | ICD-10-CM

## 2012-03-24 DIAGNOSIS — J329 Chronic sinusitis, unspecified: Secondary | ICD-10-CM

## 2012-03-24 MED ORDER — DIAZEPAM 5 MG PO TABS: 5.0000 mg | ORAL_TABLET | Freq: Four times a day (QID) | ORAL | Status: DC | PRN

## 2012-03-24 MED ORDER — FLUCONAZOLE 150 MG PO TABS: 150.0000 mg | ORAL_TABLET | Freq: Once | ORAL | Status: DC

## 2012-03-24 MED ORDER — CLINDAMYCIN HCL 300 MG PO CAPS: 300.0000 mg | ORAL_CAPSULE | Freq: Three times a day (TID) | ORAL | Status: DC

## 2012-03-24 MED ORDER — ESOMEPRAZOLE MAGNESIUM 40 MG PO CPDR: 40.0000 mg | DELAYED_RELEASE_CAPSULE | Freq: Every day | ORAL | Status: DC

## 2012-03-24 MED ORDER — LOSARTAN POTASSIUM-HCTZ 50-12.5 MG PO TABS: 1.0000 | ORAL_TABLET | Freq: Every day | ORAL | Status: DC

## 2012-03-24 MED ORDER — HYDROCODONE-ACETAMINOPHEN 10-500 MG PO TABS: 1.0000 | ORAL_TABLET | Freq: Four times a day (QID) | ORAL | Status: DC | PRN

## 2012-03-24 MED ORDER — MONTELUKAST SODIUM 10 MG PO TABS: 10.0000 mg | ORAL_TABLET | Freq: Every day | ORAL | Status: DC

## 2012-03-24 MED ORDER — ZOLPIDEM TARTRATE 10 MG PO TABS: 10.0000 mg | ORAL_TABLET | Freq: Every evening | ORAL | Status: DC | PRN

## 2012-03-24 MED ORDER — FUROSEMIDE 40 MG PO TABS: 40.0000 mg | ORAL_TABLET | Freq: Every day | ORAL | Status: DC

## 2012-03-24 MED ORDER — FLUTICASONE PROPIONATE 50 MCG/ACT NA SUSP: NASAL | Status: DC

## 2012-03-24 MED ORDER — LEVOTHYROXINE SODIUM 150 MCG PO TABS: 150.0000 ug | ORAL_TABLET | Freq: Every day | ORAL | Status: DC

## 2012-03-24 NOTE — Progress Notes (Signed)
  Subjective:    Patient ID: Amber Werner, female    DOB: [birth year], 56 y.o.   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 Flo Shanks; she reports sinus problems necessitating  non-emergent surgery. Her work schedule is such that she is postponing this elective procedure.  She reports green nasal discharge and frontal HA with pressure between the eyes and at nasal bridge.  She has had low-grade fever and is currently taking Clindamycin 300 mg 3x/day. Chronic allergic  rhinitis is stable with current medications.   HTN- stable w/o medication side effects; denies diaphoresis, fatigue, CP or tightness, palpitations,  edema, SOB or cough, dizziness, numbness, weakness or syncope.     Hypothyroidism- compliant with Levothyroxine; has had some weight gain but no significant change or other  signs of disease.   Insomnia- Only takes Zolpidem when she works back-to-back shifts that disrupt her usual sleep cycle.  Review of Systems  Constitutional: Positive for fever. Negative for chills, diaphoresis, activity change, fatigue and unexpected weight change.  HENT: Positive for congestion, rhinorrhea and sinus pressure. Negative for ear pain, nosebleeds, sore throat, facial swelling and postnasal drip.        Chronic sinus problem being followed by Dr. Lazarus Salines  Eyes: Negative.  Negative for visual disturbance.  Respiratory: Negative for cough, chest tightness and shortness of breath.   Cardiovascular: Negative for chest pain and palpitations.  Gastrointestinal: Negative.   Skin: Negative.   Neurological: Positive for headaches. Negative for dizziness, syncope and numbness.  Psychiatric/Behavioral: Positive for disturbed wake/sleep cycle.       Objective:   Physical Exam  Nursing note and vitals reviewed. Constitutional: She is oriented to person, place, and time. She  appears well-developed and well-nourished. No distress.  HENT:  Head: Normocephalic and atraumatic.  Right Ear: Hearing, external ear and ear canal normal. Tympanic membrane is scarred.  Left Ear: Hearing, external ear and ear canal normal. Tympanic membrane is scarred.  Nose: No nasal deformity or septal deviation. Right sinus exhibits frontal sinus tenderness. Right sinus exhibits no maxillary sinus tenderness. Left sinus exhibits frontal sinus tenderness. Left sinus exhibits no maxillary sinus tenderness.  Mouth/Throat: Uvula is midline, oropharynx is clear and moist and mucous membranes are normal. No oral lesions. Normal dentition. No dental caries.  Eyes: Conjunctivae normal and EOM are normal. Pupils are equal, round, and reactive to light. No scleral icterus.  Neck: Normal range of motion. Neck supple. No thyromegaly present.  Cardiovascular: Normal rate and regular rhythm.   Pulmonary/Chest: Effort normal. No respiratory distress.  Lymphadenopathy:    She has no cervical adenopathy.  Neurological: She is alert and oriented to person, place, and time. She has normal reflexes. No cranial nerve deficit. Coordination normal.  Skin: Skin is warm and dry.  Psychiatric: She has a normal mood and affect. Her behavior is normal. Judgment and thought content normal.         Assessment & Plan:   1. HTN (hypertension)  furosemide (LASIX) 40 MG tablet, losartan-hydrochlorothiazide (HYZAAR) 50-12.5 MG per tablet  2. Sinusitis, chronic  RF: Clindamycin  3. AR (allergic rhinitis)  montelukast (SINGULAIR) 10 MG tablet, fluticasone (FLONASE) 50 MCG/ACT nasal spray  4. Medication refills for all chronic medications

## 2012-03-26 ENCOUNTER — Encounter: Payer: Self-pay | Admitting: Family Medicine

## 2012-07-31 ENCOUNTER — Other Ambulatory Visit: Payer: Self-pay

## 2012-10-22 ENCOUNTER — Other Ambulatory Visit (HOSPITAL_COMMUNITY): Payer: Self-pay | Admitting: Orthopedic Surgery

## 2012-10-22 DIAGNOSIS — M545 Low back pain, unspecified: Secondary | ICD-10-CM

## 2012-10-26 ENCOUNTER — Ambulatory Visit (HOSPITAL_COMMUNITY): Payer: 59

## 2012-10-27 ENCOUNTER — Ambulatory Visit (HOSPITAL_COMMUNITY): Admission: RE | Admit: 2012-10-27 | Payer: 59 | Source: Ambulatory Visit

## 2012-11-09 ENCOUNTER — Telehealth: Payer: Self-pay

## 2012-11-09 DIAGNOSIS — I10 Essential (primary) hypertension: Secondary | ICD-10-CM

## 2012-11-09 DIAGNOSIS — J309 Allergic rhinitis, unspecified: Secondary | ICD-10-CM

## 2012-11-09 MED ORDER — FUROSEMIDE 40 MG PO TABS: 40.0000 mg | ORAL_TABLET | Freq: Every day | ORAL | Status: DC

## 2012-11-09 MED ORDER — MONTELUKAST SODIUM 10 MG PO TABS: 10.0000 mg | ORAL_TABLET | Freq: Every day | ORAL | Status: DC

## 2012-11-09 MED ORDER — LOSARTAN POTASSIUM-HCTZ 50-12.5 MG PO TABS: 1.0000 | ORAL_TABLET | Freq: Every day | ORAL | Status: DC

## 2012-11-09 MED ORDER — LEVOTHYROXINE SODIUM 150 MCG PO TABS: 150.0000 ug | ORAL_TABLET | Freq: Every day | ORAL | Status: DC

## 2012-11-09 MED ORDER — ESOMEPRAZOLE MAGNESIUM 40 MG PO CPDR: 40.0000 mg | DELAYED_RELEASE_CAPSULE | Freq: Every day | ORAL | Status: DC

## 2012-11-09 NOTE — Telephone Encounter (Signed)
I have approved all meds; pt was actually advised to return in April for follow-up on Thyroid disorder and HTN. Thanks; I will see her in Sept 2014.

## 2012-11-09 NOTE — Telephone Encounter (Signed)
Pended, patient states she was advised to follow up in Sept please advise, pended 90 day supply

## 2012-11-09 NOTE — Telephone Encounter (Signed)
Pt would like to have all of her medications refilled, she is not really sure what they all are but she does know that she needs synthroid. Best#  [phone]  Pharmacy: [facility]

## 2012-11-15 ENCOUNTER — Encounter (HOSPITAL_COMMUNITY): Payer: Self-pay | Admitting: General Practice

## 2012-11-15 ENCOUNTER — Ambulatory Visit (HOSPITAL_COMMUNITY)
Admission: AD | Admit: 2012-11-15 | Discharge: 2012-11-16 | Disposition: A | Discharge status: Home / Self Care | Payer: 59 | Source: Ambulatory Visit | Attending: Cardiology | Admitting: Cardiology

## 2012-11-15 DIAGNOSIS — Z79899 Other long term (current) drug therapy: Secondary | ICD-10-CM | POA: Insufficient documentation

## 2012-11-15 DIAGNOSIS — Z8249 Family history of ischemic heart disease and other diseases of the circulatory system: Secondary | ICD-10-CM | POA: Insufficient documentation

## 2012-11-15 DIAGNOSIS — R0602 Shortness of breath: Secondary | ICD-10-CM | POA: Insufficient documentation

## 2012-11-15 DIAGNOSIS — I10 Essential (primary) hypertension: Secondary | ICD-10-CM | POA: Insufficient documentation

## 2012-11-15 DIAGNOSIS — R079 Chest pain, unspecified: Secondary | ICD-10-CM

## 2012-11-15 DIAGNOSIS — K219 Gastro-esophageal reflux disease without esophagitis: Secondary | ICD-10-CM | POA: Insufficient documentation

## 2012-11-15 DIAGNOSIS — I201 Angina pectoris with documented spasm: Principal | ICD-10-CM | POA: Insufficient documentation

## 2012-11-15 HISTORY — DX: Chest pain, unspecified: R07.9

## 2012-11-15 HISTORY — DX: Hyperlipidemia, unspecified: E78.5

## 2012-11-15 HISTORY — DX: Methicillin resistant Staphylococcus aureus infection, unspecified site: A49.02

## 2012-11-15 LAB — LIPID PANEL
Cholesterol: 292 mg/dL — ABNORMAL HIGH (ref 0–200)
HDL: 98 mg/dL (ref >39)
LDL Cholesterol: 170 mg/dL — ABNORMAL HIGH (ref 0–99)
Total CHOL/HDL Ratio: 3 RATIO
Triglycerides: 119 mg/dL (ref <150)
VLDL: 24 mg/dL (ref 0–40)

## 2012-11-15 LAB — CBC
HCT: 38.5 % (ref 36.0–46.0)
Hemoglobin: 13.2 g/dL (ref 12.0–15.0)
MCH: 29.9 pg (ref 26.0–34.0)
MCHC: 34.3 g/dL (ref 30.0–36.0)
MCV: 87.1 fL (ref 78.0–100.0)
Platelets: 291 10*3/uL (ref 150–400)
RBC: 4.42 MIL/uL (ref 3.87–5.11)
RDW: 13.6 % (ref 11.5–15.5)
WBC: 6.5 10*3/uL (ref 4.0–10.5)

## 2012-11-15 LAB — COMPREHENSIVE METABOLIC PANEL
ALT: 17 U/L (ref 0–35)
AST: 21 U/L (ref 0–37)
Albumin: 4.1 g/dL (ref 3.5–5.2)
Alkaline Phosphatase: 84 U/L (ref 39–117)
BUN: 16 mg/dL (ref 6–23)
CO2: 34 mEq/L — ABNORMAL HIGH (ref 19–32)
Calcium: 9.6 mg/dL (ref 8.4–10.5)
Chloride: 95 mEq/L — ABNORMAL LOW (ref 96–112)
Creatinine, Ser: 0.86 mg/dL (ref 0.50–1.10)
GFR calc Af Amer: 87 mL/min — ABNORMAL LOW (ref >90)
GFR calc non Af Amer: 75 mL/min — ABNORMAL LOW (ref >90)
Glucose, Bld: 99 mg/dL (ref 70–99)
Potassium: 3 mEq/L — ABNORMAL LOW (ref 3.5–5.1)
Sodium: 140 mEq/L (ref 135–145)
Total Bilirubin: 0.3 mg/dL (ref 0.3–1.2)
Total Protein: 7.4 g/dL (ref 6.0–8.3)

## 2012-11-15 LAB — TROPONIN I
Troponin I: <0.3 ng/mL (ref <0.30)
Troponin I: <0.3 ng/mL (ref <0.30)
Troponin I: <0.3 ng/mL (ref <0.30)

## 2012-11-15 LAB — PROTIME-INR
INR: 0.9 (ref 0.00–1.49)
Prothrombin Time: 12.1 seconds (ref 11.6–15.2)

## 2012-11-15 LAB — CK TOTAL AND CKMB (NOT AT ARMC)
CK, MB: 2.4 ng/mL (ref 0.3–4.0)
CK, MB: 1.9 ng/mL (ref 0.3–4.0)
CK, MB: 1.8 ng/mL (ref 0.3–4.0)
Relative Index: 1.6 (ref 0.0–2.5)
Relative Index: 1.6 (ref 0.0–2.5)
Relative Index: 1.6 (ref 0.0–2.5)
Total CK: 146 U/L (ref 7–177)
Total CK: 118 U/L (ref 7–177)
Total CK: 116 U/L (ref 7–177)

## 2012-11-15 LAB — APTT: aPTT: 26 seconds (ref 24–37)

## 2012-11-15 MED ORDER — SODIUM CHLORIDE 0.9 % IV SOLN
INTRAVENOUS | Status: DC
  Administered 2012-11-16: 04:00:00 via INTRAVENOUS

## 2012-11-15 MED ORDER — FENTANYL CITRATE 0.05 MG/ML IJ SOLN: 50.0000 ug | INTRAMUSCULAR | Status: DC | PRN

## 2012-11-15 MED ORDER — ASPIRIN 81 MG PO CHEW
324.0000 mg | CHEWABLE_TABLET | Freq: Once | ORAL | Status: AC
  Administered 2012-11-16: 324 mg via ORAL
  Filled 2012-11-15: qty 4

## 2012-11-15 MED ORDER — ACETAMINOPHEN 325 MG PO TABS
650.0000 mg | ORAL_TABLET | ORAL | Status: DC | PRN
  Administered 2012-11-15: 650 mg via ORAL
  Filled 2012-11-15: qty 2

## 2012-11-15 MED ORDER — SODIUM CHLORIDE 0.9 % IJ SOLN: 3.0000 mL | Freq: Two times a day (BID) | INTRAMUSCULAR | Status: DC

## 2012-11-15 MED ORDER — ENOXAPARIN SODIUM 80 MG/0.8ML ~~LOC~~ SOLN
75.0000 mg | Freq: Two times a day (BID) | SUBCUTANEOUS | Status: DC
  Administered 2012-11-15: 75 mg via SUBCUTANEOUS
  Filled 2012-11-15 (×3): qty 0.8

## 2012-11-15 MED ORDER — PANTOPRAZOLE SODIUM 40 MG PO TBEC: 40.0000 mg | DELAYED_RELEASE_TABLET | Freq: Every day | ORAL | Status: DC

## 2012-11-15 MED ORDER — SODIUM CHLORIDE 0.9 % IV SOLN
INTRAVENOUS | Status: DC
  Administered 2012-11-15: 20 mL/h via INTRAVENOUS

## 2012-11-15 MED ORDER — MONTELUKAST SODIUM 10 MG PO TABS
10.0000 mg | ORAL_TABLET | Freq: Every day | ORAL | Status: DC
  Administered 2012-11-15: 10 mg via ORAL
  Filled 2012-11-15 (×2): qty 1

## 2012-11-15 MED ORDER — ASPIRIN 81 MG PO CHEW: 324.0000 mg | CHEWABLE_TABLET | ORAL | Status: DC

## 2012-11-15 MED ORDER — LOSARTAN POTASSIUM 50 MG PO TABS
50.0000 mg | ORAL_TABLET | Freq: Every day | ORAL | Status: DC
  Filled 2012-11-15: qty 1

## 2012-11-15 MED ORDER — ZOLPIDEM TARTRATE 5 MG PO TABS
5.0000 mg | ORAL_TABLET | Freq: Every evening | ORAL | Status: DC | PRN
  Administered 2012-11-15: 5 mg via ORAL
  Filled 2012-11-15: qty 1

## 2012-11-15 MED ORDER — NITROGLYCERIN 0.4 MG SL SUBL: 0.4000 mg | SUBLINGUAL_TABLET | SUBLINGUAL | Status: DC | PRN

## 2012-11-15 MED ORDER — ASPIRIN EC 81 MG PO TBEC: 81.0000 mg | DELAYED_RELEASE_TABLET | Freq: Every day | ORAL | Status: DC

## 2012-11-15 MED ORDER — HYDROCHLOROTHIAZIDE 12.5 MG PO CAPS
12.5000 mg | ORAL_CAPSULE | Freq: Every day | ORAL | Status: DC
  Filled 2012-11-15: qty 1

## 2012-11-15 MED ORDER — FLUCONAZOLE 150 MG PO TABS: 150.0000 mg | ORAL_TABLET | Freq: Every day | ORAL | Status: DC | PRN

## 2012-11-15 MED ORDER — SODIUM CHLORIDE 0.9 % IV SOLN: 250.0000 mL | INTRAVENOUS | Status: DC | PRN

## 2012-11-15 MED ORDER — HYDROCODONE-ACETAMINOPHEN 5-325 MG PO TABS
1.0000 | ORAL_TABLET | Freq: Four times a day (QID) | ORAL | Status: DC | PRN
  Administered 2012-11-15 – 2012-11-16 (×2): 1 via ORAL
  Filled 2012-11-15 (×2): qty 1

## 2012-11-15 MED ORDER — SODIUM CHLORIDE 0.9 % IJ SOLN: 3.0000 mL | INTRAMUSCULAR | Status: DC | PRN

## 2012-11-15 MED ORDER — ALPRAZOLAM 0.25 MG PO TABS: 0.2500 mg | ORAL_TABLET | Freq: Two times a day (BID) | ORAL | Status: DC | PRN

## 2012-11-15 MED ORDER — FLUTICASONE PROPIONATE 50 MCG/ACT NA SUSP
2.0000 | Freq: Every day | NASAL | Status: DC
  Administered 2012-11-15: 2 via NASAL
  Filled 2012-11-15: qty 16

## 2012-11-15 MED ORDER — NITROGLYCERIN IN D5W 200-5 MCG/ML-% IV SOLN
10.0000 ug/min | INTRAVENOUS | Status: DC
  Administered 2012-11-15: 20 ug/min via INTRAVENOUS
  Filled 2012-11-15: qty 250

## 2012-11-15 MED ORDER — ENOXAPARIN SODIUM 80 MG/0.8ML ~~LOC~~ SOLN
75.0000 mg | Freq: Once | SUBCUTANEOUS | Status: AC
  Administered 2012-11-15: 75 mg via SUBCUTANEOUS
  Filled 2012-11-15: qty 0.8

## 2012-11-15 MED ORDER — CARVEDILOL 6.25 MG PO TABS
6.2500 mg | ORAL_TABLET | Freq: Two times a day (BID) | ORAL | Status: DC
  Administered 2012-11-15: 6.25 mg via ORAL
  Filled 2012-11-15 (×4): qty 1

## 2012-11-15 MED ORDER — LOSARTAN POTASSIUM-HCTZ 50-12.5 MG PO TABS: 1.0000 | ORAL_TABLET | Freq: Every day | ORAL | Status: DC

## 2012-11-15 MED ORDER — ONDANSETRON HCL 4 MG/2ML IJ SOLN: 4.0000 mg | Freq: Four times a day (QID) | INTRAMUSCULAR | Status: DC | PRN

## 2012-11-15 MED ORDER — LEVOTHYROXINE SODIUM 150 MCG PO TABS
150.0000 ug | ORAL_TABLET | Freq: Every day | ORAL | Status: DC
  Administered 2012-11-16: 150 ug via ORAL
  Filled 2012-11-15 (×2): qty 1

## 2012-11-15 MED ORDER — ASPIRIN 300 MG RE SUPP
300.0000 mg | RECTAL | Status: DC
  Filled 2012-11-15: qty 1

## 2012-11-15 NOTE — Progress Notes (Addendum)
ANTICOAGULATION CONSULT NOTE - Initial Consult  Pharmacy Consult for Lovenox Indication: chest pain/ACS  Allergies  Allergen Reactions  . Penicillins Anaphylaxis    Patient Measurements: Height: 5\' 4"  (162.6 cm) Weight: 160 lb (72.576 kg) IBW/kg (Calculated) : 54.7  Vital Signs: BP: 116/87 mmHg (06/02 0900) Pulse Rate: 68 (06/02 0900)  Labs:  today's labs just drawn. Scr 02/06/2012 = 0.97.  Estimated Creatinine Clearance: 64.8 ml/min (by C-G formula based on Cr of 0.97).   Medical History: Past Medical History  Diagnosis Date  . Hypertension   . Cervical spondylosis     S/P C5-6 and C6-7 decompression and fusion  . Chronic recurrent sinusitis     Hx of MRSA  . Thyroid disease   . Allergy    Assessment:  Admitted from Dr. Verl Dicker office with intermittent chest pain. Patient reports taking extra Aspirin last night. Beginning IV Nitroglycerin.  Planning cardiac cath later today.    Goal of Therapy:  Anti-Xa level 0.6-1.2 units/ml 4hrs after LMWH dose given Monitor platelets by anticoagulation protocol: Yes   Plan:   PT/INR and PTT added to baseline labs.  Will give Lovenox ~1 mg/kg x 1 (75 mg).  Follow up admit labs.  Follow up post-cath.   Dennie Fetters, [place] Pager: [phone] 11/15/2012,12:40 PM  Addendum:  - 1st Lovenox dose given ~12:30pm.  CBC and baseline coags  OK. Scr 0.86. K+ 3.0.  First troponin negative.   - RN reports cath is planned for 6/3.  - Will continue Lovenox 75 mg SQ q12hrs.  - f/u post-cath 6/3.  - expect K+ supplement tonight.  Hilarie Fredrickson, RPh. 11/15/2012 5:40 PM
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
## 2012-11-16 ENCOUNTER — Encounter (HOSPITAL_COMMUNITY)
Admission: AD | Disposition: A | Discharge status: Home / Self Care | Payer: Self-pay | Source: Ambulatory Visit | Attending: Cardiology

## 2012-11-16 HISTORY — PX: LEFT HEART CATHETERIZATION WITH CORONARY ANGIOGRAM: SHX5451

## 2012-11-16 LAB — BASIC METABOLIC PANEL
BUN: 15 mg/dL (ref 6–23)
CO2: 30 mEq/L (ref 19–32)
Calcium: 8.8 mg/dL (ref 8.4–10.5)
Chloride: 99 mEq/L (ref 96–112)
Creatinine, Ser: 0.84 mg/dL (ref 0.50–1.10)
GFR calc Af Amer: 90 mL/min — ABNORMAL LOW (ref >90)
GFR calc non Af Amer: 77 mL/min — ABNORMAL LOW (ref >90)
Glucose, Bld: 97 mg/dL (ref 70–99)
Potassium: 3 mEq/L — ABNORMAL LOW (ref 3.5–5.1)
Sodium: 138 mEq/L (ref 135–145)

## 2012-11-16 LAB — TSH: TSH: 0.352 u[IU]/mL (ref 0.350–4.500)

## 2012-11-16 SURGERY — LEFT HEART CATHETERIZATION WITH CORONARY ANGIOGRAM
Anesthesia: LOCAL

## 2012-11-16 MED ORDER — ACETAMINOPHEN 325 MG PO TABS: 650.0000 mg | ORAL_TABLET | ORAL | Status: DC | PRN

## 2012-11-16 MED ORDER — ONDANSETRON HCL 4 MG/2ML IJ SOLN: 4.0000 mg | Freq: Four times a day (QID) | INTRAMUSCULAR | Status: DC | PRN

## 2012-11-16 MED ORDER — AMLODIPINE BESYLATE 5 MG PO TABS: 5.0000 mg | ORAL_TABLET | Freq: Every day | ORAL | Status: DC

## 2012-11-16 MED ORDER — POTASSIUM CHLORIDE CRYS ER 20 MEQ PO TBCR
40.0000 meq | EXTENDED_RELEASE_TABLET | Freq: Once | ORAL | Status: AC
  Administered 2012-11-16: 40 meq via ORAL

## 2012-11-16 MED ORDER — MIDAZOLAM HCL 2 MG/2ML IJ SOLN
INTRAMUSCULAR | Status: AC
  Filled 2012-11-16: qty 2

## 2012-11-16 MED ORDER — SODIUM CHLORIDE 0.9 % IV SOLN: 1.0000 mL/kg/h | INTRAVENOUS | Status: DC

## 2012-11-16 MED ORDER — HEPARIN (PORCINE) IN NACL 2-0.9 UNIT/ML-% IJ SOLN
INTRAMUSCULAR | Status: AC
  Filled 2012-11-16: qty 1000

## 2012-11-16 MED ORDER — HYDROMORPHONE HCL PF 2 MG/ML IJ SOLN
INTRAMUSCULAR | Status: AC
  Filled 2012-11-16: qty 1

## 2012-11-16 MED ORDER — HEPARIN SODIUM (PORCINE) 1000 UNIT/ML IJ SOLN
INTRAMUSCULAR | Status: AC
  Filled 2012-11-16: qty 1

## 2012-11-16 MED ORDER — VERAPAMIL HCL 2.5 MG/ML IV SOLN
INTRAVENOUS | Status: AC
  Filled 2012-11-16: qty 2

## 2012-11-16 MED ORDER — NITROGLYCERIN 0.4 MG SL SUBL: 0.4000 mg | SUBLINGUAL_TABLET | SUBLINGUAL | Status: DC | PRN

## 2012-11-16 MED ORDER — AMLODIPINE BESYLATE 5 MG PO TABS
5.0000 mg | ORAL_TABLET | Freq: Every day | ORAL | Status: DC
  Administered 2012-11-16: 5 mg via ORAL
  Filled 2012-11-16: qty 1

## 2012-11-16 MED ORDER — LIDOCAINE HCL (PF) 1 % IJ SOLN
INTRAMUSCULAR | Status: AC
  Filled 2012-11-16: qty 30

## 2012-11-16 NOTE — Interval H&P Note (Signed)
History and Physical Interval Note:  11/16/2012 7:41 AM  Amber Werner  has presented today for surgery, with the diagnosis of cp  The various methods of treatment have been discussed with the patient and family. After consideration of risks, benefits and other options for treatment, the patient has consented to  Procedure(s): LEFT HEART CATHETERIZATION WITH CORONARY ANGIOGRAM (N/A) and possible PTCA as a surgical intervention .  The patient's history has been reviewed, patient examined, no change in status, stable for surgery.  I have reviewed the patient's chart and labs.  Questions were answered to the patient's satisfaction.     Pamella Pert

## 2012-11-16 NOTE — Discharge Summary (Signed)
Physician Discharge Summary  Patient ID: Amber Werner MRN: [id number] DOB/AGE: Mar 08, 1956 57 y.o.  Admit date: 11/15/2012 Discharge date: 11/16/2012  Primary Discharge Diagnosis Chest pain/Prinzmetal's angina pectoris Secondary Discharge Diagnosis Hypertension Family history of premature CAD. GERD  Significant Diagnostic Studies: 11/16/2012: Coronary angiography  Left ventricle: Performed in the RAO projection revealed LVEF of 60%. There was No MR. No wall motion abnormality.  Right coronary artery: The vessel is smooth, normal, Dominant.  Left main coronary artery is large and normal.  Circumflex coronary artery: A large vessel giving origin to a large obtuse marginal 1. It is smooth and normal.  LAD: LAD gives origin to a large diagonal-1. Normal.  Hospital Course: Patient was initially seen in the office with ongoing chest discomfort with radiation to her back in the left arm. Associated with shortness of breath. Symptoms very typical for unstable angina pectoris. EKG revealing nonspecific changes. She was admitted to the hospital and she was ruled out for myocardial infarction. She had recurrence of chest pain in the nitroglycerin was turned off, hence IV nitroglycerin was continued. The following morning she underwent cardiac catheterization and was found to have widely patent, normal coronary arteries, right dominant circulation with normal left systolic function. At this point the diagnosis of coronary spasm appears to be the most likely etiology, although GI etiology may need to be considered. This is less likely.   Recommendations on discharge: Patient will be discharged home on amlodipine 5 mg by mouth daily along with nitroglycerin 0.4 mg for sublingual use on a when necessary basis. She'll be taken off work for a week. I have extensively discussed the findings with the family including her sister and her husband. If her symptoms persist, we should also consider GI evaluation for  esophageal spasm. She does have a history of GERD. Borderline Hyperlipidemia, however, HDL markedly elevated and placing 10 year calculated ASCVD risk of 1.7% (Optimal risk 1.2%): Overall low risk. No therapy needed unless LDL > 190 mg or known CAD or vascular disease. She also has history of rhabdomyolysis in the past, hence given the fact that her coronaries are completely normal, she is no vascular disease, I'm not started her on any statins.  Discharge Exam: Blood pressure 103/73, pulse 66, temperature 97.6 F (36.4 C), temperature source Oral, resp. rate 18, height 5\' 4"  (1.626 m), weight 72.576 kg (160 lb), SpO2 99.00%.   General: Moderately built and normal body habitus who is in no acute distress. Appears stated age. Alert Ox3.  There is no cyanosis. HEENT: normal limits. PERRLA, No JVD.  CARDIAC EXAM: S1, S2 normal, no gallop present. No murmur.  CHEST EXAM: No tenderness of chest wall. LUNGS: Clear to percuss and auscultate.  ABDOMEN: No hepatosplenomegaly. BS normal in all 4 quadrants. Abdomen is non-tender.  EXTREMITY: Full range of movementes, No edema. MUSCULOSKELETAL EXAM: Intact with full range of motion in all 4 extremities.  NEUROLOGIC EXAM: Grossly intact without any focal deficits. Alert O x 3.  VASCULAR EXAM: No skin breakdown. Carotids normal. Extremities: Femoral pulse normal. Popliteal pulse normal ; Pedal pulse normal. Otherwise No prominent pulse felt in the abdomen. No varicose veins.  No results found.  Labs:   Lab Results  Component Value Date   WBC 6.5 11/15/2012   HGB 13.2 11/15/2012   HCT 38.5 11/15/2012   MCV 87.1 11/15/2012   PLT 291 11/15/2012    Recent Labs Lab 11/15/12 1235 11/16/12 0648  NA 140 138  K 3.0* 3.0*  CL  95* 99  CO2 34* 30  BUN 16 15  CREATININE 0.86 0.84  CALCIUM 9.6 8.8  PROT 7.4  --   BILITOT 0.3  --   ALKPHOS 84  --   ALT 17  --   AST 21  --   GLUCOSE 99 97   Lab Results  Component Value Date   CKTOTAL 116 11/15/2012   CKMB 1.8  11/15/2012   TROPONINI <0.30 11/15/2012    Lipid Panel     Component Value Date/Time   CHOL 292* 11/15/2012 1235   TRIG 119 11/15/2012 1235   HDL 98 11/15/2012 1235   CHOLHDL 3.0 11/15/2012 1235   VLDL 24 11/15/2012 1235   LDLCALC 170* 11/15/2012 1235  Non-HDL 122 mg  EKG: 11/15/2012: Normal sinus rhythm. Normal axis, normal intervals, normal EKG. Compared to the EKG done in my office earlier in the morning, nonspecific inferior T-wave changes not present.     FOLLOW UP PLANS AND APPOINTMENTS: She'll follow up with me in the office in 10 days.    Medication List    STOP taking these medications       aspirin 325 MG tablet     losartan-hydrochlorothiazide 50-12.5 MG per tablet  Commonly known as:  HYZAAR      TAKE these medications       ALPRAZolam 0.25 MG tablet  Commonly known as:  XANAX  Take 0.25 mg by mouth daily as needed (chest pain).     amLODipine 5 MG tablet  Commonly known as:  NORVASC  Take 1 tablet (5 mg total) by mouth daily.     esomeprazole 40 MG capsule  Commonly known as:  NEXIUM  Take 40 mg by mouth daily as needed (gurd).     fluconazole 150 MG tablet  Commonly known as:  DIFLUCAN  Take 150 mg by mouth daily as needed (take with antibiotics).     fluticasone 50 MCG/ACT nasal spray  Commonly known as:  FLONASE  Place 2 sprays into the nose daily. Spray 1 time in each nostril at bedtime.     furosemide 40 MG tablet  Commonly known as:  LASIX  Take 1 tablet (40 mg total) by mouth daily.     HYDROcodone-acetaminophen 5-325 MG per tablet  Commonly known as:  NORCO/VICODIN  Take 1 tablet by mouth every 6 (six) hours as needed for pain.     levothyroxine 150 MCG tablet  Commonly known as:  SYNTHROID  Take 1 tablet (150 mcg total) by mouth daily.     montelukast 10 MG tablet  Commonly known as:  SINGULAIR  Take 1 tablet (10 mg total) by mouth at bedtime.     multivitamin tablet  Take 1 tablet by mouth daily. With lutein     nitroGLYCERIN 0.4 MG SL  tablet  Commonly known as:  NITROSTAT  Place 1 tablet (0.4 mg total) under the tongue every 5 (five) minutes as needed for chest pain.     zolpidem 10 MG tablet  Commonly known as:  AMBIEN  Take 10 mg by mouth at bedtime as needed for sleep.          Pamella Pert, MD 11/16/2012, 8:43 AM  Pager: [phone] Office: [phone] If no answer: [phone]

## 2012-11-16 NOTE — Progress Notes (Signed)
Came to visit with patient on behalf of [employer] program for [employer] employees with [employer]. However, patient was already discharged. Will follow up telephonically.  Raiford Noble, MSN-Ed, RN,BSN- [facility] Liaison514 [phone]

## 2012-11-16 NOTE — CV Procedure (Signed)
Procedure performed:  Left heart catheterization including hemodynamic monitoring of the left ventricle, LV gram, selective right and left coronary arteriography.  Indication patient is a 57 year-old female with history of hypertension, borderline hyperlipidemia, family history of premature CAD, who presents with chest pain suggestive of unstable angina. Hence is brought to the cardiac catheterization lab to evaluate the  coronary anatomy for definitive diagnosis of CAD.  Hemodynamic data:  Left ventricular pressure was 101/4 with LVEDP of 9 mm mercury. Aortic pressure was 99/65 with a mean of 81 mm mercury. There was no pressure gradient across the aortic valve  Left ventricle: Performed in the RAO projection revealed LVEF of 60%. There was No MR. No wall motion abnormality.  Right coronary artery: The vessel is smooth, normal,  Dominant.  Left main coronary artery is large and normal.  Circumflex coronary artery: A large vessel giving origin to a large obtuse marginal 1. It is smooth and normal.   LAD:  LAD gives origin to a large diagonal-1.  Normal.  Technique: Under sterile precautions using a 6 French right radial  arterial access, a 6 French sheath was introduced into the right radial artery. A 5 [place] Tig 4 catheter was advanced into the ascending aorta selective  right coronary artery and left coronary artery was cannulated and angiography was performed in multiple views. The catheter was pulled back Out of the body over exchange length J-wire. Same Catheter was used to perform LV gram which was performed in RAO projection. Catheter exchanged out of the body over J-Wire. NO immediate complications noted. Patient tolerated the procedure well.   Rec: Medical therapy with CCB and nitrates for Prinzmetal's angina pectoris  Disposition: Will be discharged home today with outpatient follow up.

## 2012-11-16 NOTE — Progress Notes (Signed)
Discharge instructions given and reviewed with patient. Patient discharged home with family member

## 2012-12-02 ENCOUNTER — Other Ambulatory Visit: Payer: Self-pay | Admitting: Family Medicine

## 2012-12-02 ENCOUNTER — Telehealth: Payer: Self-pay

## 2012-12-02 MED ORDER — ZOLPIDEM TARTRATE 10 MG PO TABS: 10.0000 mg | ORAL_TABLET | Freq: Every evening | ORAL | Status: DC | PRN

## 2012-12-02 NOTE — Progress Notes (Signed)
Pt  requested refill on Zolpidem which I phoned in to [facility] this AM; a note from Dr. Jacinto Halim shows that pt had him authorize refills (#30 w/ 2 RFs). I called [facility] to void my refill.

## 2012-12-02 NOTE — Telephone Encounter (Signed)
[facility] requesting refill on Zolpidem 10 mg.

## 2012-12-09 ENCOUNTER — Ambulatory Visit (HOSPITAL_COMMUNITY)
Admission: RE | Admit: 2012-12-09 | Discharge: 2012-12-09 | Disposition: A | Discharge status: Home / Self Care | Payer: 59 | Source: Ambulatory Visit | Attending: Optometrist | Admitting: Optometrist

## 2012-12-09 ENCOUNTER — Other Ambulatory Visit (HOSPITAL_COMMUNITY): Payer: Self-pay | Admitting: *Deleted

## 2012-12-09 ENCOUNTER — Ambulatory Visit (HOSPITAL_COMMUNITY): Payer: 59

## 2012-12-09 ENCOUNTER — Encounter (HOSPITAL_COMMUNITY): Payer: Self-pay

## 2012-12-09 DIAGNOSIS — H5710 Ocular pain, unspecified eye: Secondary | ICD-10-CM

## 2012-12-09 DIAGNOSIS — J329 Chronic sinusitis, unspecified: Secondary | ICD-10-CM | POA: Insufficient documentation

## 2012-12-09 MED ORDER — IOHEXOL 300 MG/ML  SOLN
75.0000 mL | Freq: Once | INTRAMUSCULAR | Status: AC | PRN
  Administered 2012-12-09: 75 mL via INTRAVENOUS

## 2012-12-10 ENCOUNTER — Encounter: Payer: Self-pay | Admitting: Family Medicine

## 2013-01-12 ENCOUNTER — Other Ambulatory Visit: Payer: Self-pay | Admitting: Family Medicine

## 2013-01-14 ENCOUNTER — Encounter: Payer: Self-pay | Admitting: Family Medicine

## 2013-01-14 ENCOUNTER — Ambulatory Visit (INDEPENDENT_AMBULATORY_CARE_PROVIDER_SITE_OTHER): Payer: 59 | Admitting: Family Medicine

## 2013-01-14 VITALS — BP 136/74 | HR 63 | Temp 97.9°F | Resp 16 | Ht 64.0 in | Wt 161.8 lb

## 2013-01-14 DIAGNOSIS — J329 Chronic sinusitis, unspecified: Secondary | ICD-10-CM

## 2013-01-14 DIAGNOSIS — R350 Frequency of micturition: Secondary | ICD-10-CM

## 2013-01-14 DIAGNOSIS — Z8619 Personal history of other infectious and parasitic diseases: Secondary | ICD-10-CM | POA: Insufficient documentation

## 2013-01-14 DIAGNOSIS — E039 Hypothyroidism, unspecified: Secondary | ICD-10-CM

## 2013-01-14 DIAGNOSIS — I10 Essential (primary) hypertension: Secondary | ICD-10-CM

## 2013-01-14 LAB — T3, FREE: T3, Free: 3.4 pg/mL (ref 2.3–4.2)

## 2013-01-14 LAB — BASIC METABOLIC PANEL
BUN: 18 mg/dL (ref 6–23)
CO2: 31 mEq/L (ref 19–32)
Calcium: 10 mg/dL (ref 8.4–10.5)
Chloride: 97 mEq/L (ref 96–112)
Creat: 1.02 mg/dL (ref 0.50–1.10)
Glucose, Bld: 86 mg/dL (ref 70–99)
Potassium: 3.8 mEq/L (ref 3.5–5.3)
Sodium: 138 mEq/L (ref 135–145)

## 2013-01-14 LAB — POCT URINALYSIS DIPSTICK
Bilirubin, UA: NEGATIVE
Blood, UA: NEGATIVE
Glucose, UA: NEGATIVE
Ketones, UA: NEGATIVE
Leukocytes, UA: NEGATIVE
Nitrite, UA: NEGATIVE
Protein, UA: NEGATIVE
Spec Grav, UA: 1.01
Urobilinogen, UA: 0.2
pH, UA: 6

## 2013-01-14 LAB — TSH: TSH: 0.721 u[IU]/mL (ref 0.350–4.500)

## 2013-01-14 LAB — T4, FREE: Free T4: 1.59 ng/dL (ref 0.80–1.80)

## 2013-01-14 MED ORDER — CLINDAMYCIN HCL 300 MG PO CAPS: 300.0000 mg | ORAL_CAPSULE | Freq: Three times a day (TID) | ORAL | Status: DC

## 2013-01-14 NOTE — Patient Instructions (Addendum)
Hypothyroidism The thyroid is a large gland located in the lower front of your neck. The thyroid gland helps control metabolism. Metabolism is how your body handles food. It controls metabolism with the hormone thyroxine. When this gland is underactive (hypothyroid), it produces too little hormone.  CAUSES These include:   Absence or destruction of thyroid tissue.  Goiter due to iodine deficiency.  Goiter due to medications.  Congenital defects (since birth).  Problems with the pituitary. This causes a lack of TSH (thyroid stimulating hormone). This hormone tells the thyroid to turn out more hormone. SYMPTOMS  Lethargy (feeling as though you have no energy)  Cold intolerance  Weight gain (in spite of normal food intake)  Dry skin  Coarse hair  Menstrual irregularity (if severe, may lead to infertility)  Slowing of thought processes Cardiac problems are also caused by insufficient amounts of thyroid hormone. Hypothyroidism in the newborn is cretinism, and is an extreme form. It is important that this form be treated adequately and immediately or it will lead rapidly to retarded physical and mental development. DIAGNOSIS  To prove hypothyroidism, your caregiver may do blood tests and ultrasound tests. Sometimes the signs are hidden. It may be necessary for your caregiver to watch this illness with blood tests either before or after diagnosis and treatment. TREATMENT  Low levels of thyroid hormone are increased by using synthetic thyroid hormone. This is a safe, effective treatment. It usually takes about four weeks to gain the full effects of the medication. After you have the full effect of the medication, it will generally take another four weeks for problems to leave. Your caregiver may start you on low doses. If you have had heart problems the dose may be gradually increased. It is generally not an emergency to get rapidly to normal. HOME CARE INSTRUCTIONS   Take your  medications as your caregiver suggests. Let your caregiver know of any medications you are taking or start taking. Your caregiver will help you with dosage schedules.  As your condition improves, your dosage needs may increase. It will be necessary to have continuing blood tests as suggested by your caregiver.  Report all suspected medication side effects to your caregiver. SEEK MEDICAL CARE IF: Seek medical care if you develop:  Sweating.  Tremulousness (tremors).  Anxiety.  Rapid weight loss.  Heat intolerance.  Emotional swings.  Diarrhea.  Weakness. SEEK IMMEDIATE MEDICAL CARE IF:  You develop chest pain, an irregular heart beat (palpitations), or a rapid heart beat. MAKE SURE YOU:   Understand these instructions.  Will watch your condition.  Will get help right away if you are not doing well or get worse. Document Released: 06/02/2005 Document Revised: 08/25/2011 Document Reviewed: 01/21/2008 [facility] Patient Information 2014 [place], [place].    Contact your GYN provider for evaluation of (probable) Atrophic vaginitis and menopause symptoms.

## 2013-01-15 ENCOUNTER — Encounter: Payer: Self-pay | Admitting: Family Medicine

## 2013-01-16 ENCOUNTER — Encounter: Payer: Self-pay | Admitting: Family Medicine

## 2013-01-16 NOTE — Progress Notes (Signed)
Subjective:    Patient ID: Amber Werner, female    DOB: 11-03-1955, 57 y.o.   MRN: [id number]  HPI This 57 y.o. Cauc female is here for medication review/refills and to update this provider re: recent cardiac work-up for chest pain and coronary spasms.(cardiac cath). Pt has HTN and hx of heart murmur; she is compliant w/ medications. As an Char[employer] fundraiser, she has a significant amount of job-related stress.  Pt has hypothyroidism and problems w/ chronic intermittent flare of sinus and allergic symptoms. Today she reports sinus pain and discolored drainage; pt has been off pro- Phylactic antibiotic about 6 weeks when symptoms recurred. She has hx of MRSA and requests refill of Clindamycin.  Pt also c/o urinary discomfort and painful intercourse. GYN care is due; pt will call to discuss this w/ her GYN provider.She is menopausal and has no other GYN symptoms.   PMHx, Soc Hx and Problem List reviewed. Medications reconciled.   Review of Systems  Constitutional: Positive for fever and fatigue. Negative for chills, activity change, appetite change and unexpected weight change.  HENT: Positive for congestion, rhinorrhea and sinus pressure. Negative for ear pain, sore throat, facial swelling, sneezing and trouble swallowing.   Eyes: Negative.   Respiratory: Negative for cough and shortness of breath.   Cardiovascular: Negative.   Gastrointestinal: Negative.   Genitourinary: Positive for dysuria and dyspareunia. Negative for urgency, frequency, hematuria, vaginal discharge, difficulty urinating, vaginal pain and pelvic pain.  Allergic/Immunologic: Positive for environmental allergies.  Neurological: Negative for dizziness, syncope, weakness, numbness and headaches.  Hematological: Negative for adenopathy.  Psychiatric/Behavioral: Positive for sleep disturbance. The patient is nervous/anxious.        Objective:   Physical Exam  Nursing note and vitals reviewed. Constitutional: She is oriented to  person, place, and time. She appears well-developed and well-nourished. No distress.  HENT:  Head: Normocephalic and atraumatic.  Right Ear: Hearing, external ear and ear canal normal. Tympanic membrane is scarred. Tympanic membrane is not perforated, not erythematous and not bulging. No middle ear effusion.  Left Ear: Hearing, external ear and ear canal normal. Tympanic membrane is scarred. Tympanic membrane is not perforated, not erythematous and not bulging.  No middle ear effusion.  Nose: Mucosal edema and rhinorrhea present. No nasal deformity or septal deviation. Right sinus exhibits maxillary sinus tenderness. Right sinus exhibits no frontal sinus tenderness. Left sinus exhibits no maxillary sinus tenderness and no frontal sinus tenderness.  Mouth/Throat: Uvula is midline and mucous membranes are normal. No oral lesions. Normal dentition. No edematous. Posterior oropharyngeal erythema present. No oropharyngeal exudate.  Eyes: Conjunctivae and EOM are normal. Pupils are equal, round, and reactive to light. No scleral icterus.  Neck: Normal range of motion. Neck supple. No thyromegaly present.  Cardiovascular: Normal rate, regular rhythm and normal heart sounds.  Exam reveals no gallop and no friction rub.   No murmur heard. Pulmonary/Chest: Effort normal and breath sounds normal. No respiratory distress. She has no wheezes.  Abdominal: There is no CVA tenderness.  Musculoskeletal: Normal range of motion. She exhibits no edema.  Lymphadenopathy:    She has no cervical adenopathy.  Neurological: She is alert and oriented to person, place, and time. No cranial nerve deficit. She exhibits normal muscle tone. Coordination normal.  Skin: Skin is warm. No rash noted. No erythema. No pallor.  Psychiatric: Her speech is normal. Judgment and thought content normal. Her mood appears anxious. Her affect is labile. Her affect is not blunt and not inappropriate. She is not  agitated, not aggressive, not  slowed and not withdrawn. Cognition and memory are normal. She does not exhibit a depressed mood. She is attentive.       Assessment & Plan:  Urinary frequency - Suspect related to Atrophic vaginitis; consult GYN.    Plan: POCT urinalysis dipstick  Unspecified hypothyroidism - Continue current replacement dose pending labs.   Plan: TSH, T4, Free, T3, Free  Chronic recurrent sinusitis- RX: Clindamycin 300 mg 1 cap tid.  HTN (hypertension) - Stable and controlled on current medications; no change at present. Medication refills were addressed yesterday.  Plan: Basic metabolic panel  History of shingles- Refill Valtrex.   Encouraged pt to keep follow-up appointments.  Meds ordered this encounter  Medications  . valACYclovir (VALTREX) 1000 MG tablet    Sig: Take 1,000 mg by mouth 3 (three) times daily.  Marland Kitchen aspirin 325 MG tablet    Sig: Take 325 mg by mouth daily.  . cetirizine (ZYRTEC) 10 MG tablet    Sig: Take 10 mg by mouth daily.  . clindamycin (CLEOCIN) 300 MG capsule    Sig: Take 1 capsule (300 mg total) by mouth 3 (three) times daily.    Dispense:  100 capsule    Refill:  2

## 2013-01-17 ENCOUNTER — Telehealth: Payer: Self-pay

## 2013-01-17 MED ORDER — HYDROCODONE-ACETAMINOPHEN 10-325 MG PO TABS: 1.0000 | ORAL_TABLET | Freq: Four times a day (QID) | ORAL | Status: DC | PRN

## 2013-01-17 NOTE — Telephone Encounter (Signed)
Pharm reqs change of Rx from hydrocodone 10/500 to 10/325 since 10/500 not available. I have called to change it.

## 2013-02-28 ENCOUNTER — Telehealth: Payer: Self-pay

## 2013-02-28 NOTE — Telephone Encounter (Signed)
Pharm is requesting a Rx for Alprazolam 0.25 mg tab, 1 tab Q8 hr prn. It looks like this was being Rxd  By Dr Tressie Stalker, but pt is seeing Dr Audria Nine now and they have req'd it from her. Dr Audria Nine, do you want to Rx for pt or need to see pt to discuss 1st?

## 2013-03-02 ENCOUNTER — Other Ambulatory Visit: Payer: Self-pay | Admitting: Family Medicine

## 2013-03-02 ENCOUNTER — Telehealth: Payer: Self-pay | Admitting: Family Medicine

## 2013-03-02 MED ORDER — ALPRAZOLAM 0.25 MG PO TABS: 0.2500 mg | ORAL_TABLET | Freq: Every day | ORAL | Status: DC | PRN

## 2013-03-02 NOTE — Telephone Encounter (Signed)
Alprazolam notation- this medication is prescribed by Dr. Lovell Sheehan.

## 2013-03-02 NOTE — Telephone Encounter (Signed)
[name]) called back about Alprazolam refill; this has been authorized by Dr. Lovell Sheehan. The refill I authorized was voided.

## 2013-03-02 NOTE — Telephone Encounter (Signed)
Alprazolam refilled phoned to [facility]- #30 w/ no refills.

## 2013-04-01 ENCOUNTER — Other Ambulatory Visit: Payer: Self-pay

## 2013-04-01 MED ORDER — LOSARTAN POTASSIUM-HCTZ 50-12.5 MG PO TABS: ORAL_TABLET | ORAL | Status: DC

## 2013-04-01 MED ORDER — FUROSEMIDE 40 MG PO TABS: 40.0000 mg | ORAL_TABLET | Freq: Every day | ORAL | Status: DC

## 2013-04-01 MED ORDER — MONTELUKAST SODIUM 10 MG PO TABS: 10.0000 mg | ORAL_TABLET | Freq: Every day | ORAL | Status: DC

## 2013-04-01 MED ORDER — SYNTHROID 150 MCG PO TABS: ORAL_TABLET | ORAL | Status: DC

## 2013-04-01 MED ORDER — NEXIUM 40 MG PO CPDR: DELAYED_RELEASE_CAPSULE | ORAL | Status: DC

## 2013-04-06 ENCOUNTER — Telehealth: Payer: Self-pay

## 2013-04-06 NOTE — Telephone Encounter (Signed)
Pharm requests RF of zolpidem from Dr Audria Nine.

## 2013-04-07 NOTE — Telephone Encounter (Signed)
I called the pharmacy; Dr. Jacinto Halim refilled this med on 04/06/13; he faxed back  #30 w/ 2 RFs.

## 2013-04-21 ENCOUNTER — Other Ambulatory Visit: Payer: Self-pay

## 2013-04-22 ENCOUNTER — Ambulatory Visit: Payer: 59 | Admitting: Family Medicine

## 2013-12-06 ENCOUNTER — Other Ambulatory Visit (HOSPITAL_COMMUNITY): Payer: Self-pay | Admitting: Family Medicine

## 2013-12-06 DIAGNOSIS — Z1231 Encounter for screening mammogram for malignant neoplasm of breast: Secondary | ICD-10-CM

## 2013-12-23 ENCOUNTER — Ambulatory Visit (HOSPITAL_COMMUNITY)
Admission: RE | Admit: 2013-12-23 | Discharge: 2013-12-23 | Disposition: A | Discharge status: Home / Self Care | Payer: 59 | Source: Ambulatory Visit | Attending: Family Medicine | Admitting: Family Medicine

## 2013-12-23 DIAGNOSIS — Z1231 Encounter for screening mammogram for malignant neoplasm of breast: Secondary | ICD-10-CM | POA: Insufficient documentation

## 2014-05-25 ENCOUNTER — Encounter (HOSPITAL_COMMUNITY): Payer: Self-pay | Admitting: Cardiology

## 2014-07-11 ENCOUNTER — Telehealth: Payer: Self-pay | Admitting: *Deleted

## 2014-07-11 NOTE — Telephone Encounter (Signed)
Pharmacy called for permission to change prescription to generic.

## 2014-10-27 ENCOUNTER — Other Ambulatory Visit (HOSPITAL_COMMUNITY): Payer: Self-pay | Admitting: Family Medicine

## 2014-10-27 DIAGNOSIS — M5440 Lumbago with sciatica, unspecified side: Secondary | ICD-10-CM

## 2014-11-14 ENCOUNTER — Ambulatory Visit (HOSPITAL_COMMUNITY): Payer: 59

## 2014-12-01 ENCOUNTER — Other Ambulatory Visit (HOSPITAL_COMMUNITY): Payer: Self-pay | Admitting: Obstetrics and Gynecology

## 2014-12-01 DIAGNOSIS — Z1231 Encounter for screening mammogram for malignant neoplasm of breast: Secondary | ICD-10-CM

## 2014-12-29 ENCOUNTER — Ambulatory Visit (HOSPITAL_COMMUNITY): Payer: 59

## 2015-01-02 ENCOUNTER — Ambulatory Visit (HOSPITAL_COMMUNITY)
Admission: RE | Admit: 2015-01-02 | Discharge: 2015-01-02 | Disposition: A | Discharge status: Home / Self Care | Payer: 59 | Source: Ambulatory Visit | Attending: Obstetrics and Gynecology | Admitting: Obstetrics and Gynecology

## 2015-01-02 DIAGNOSIS — Z1231 Encounter for screening mammogram for malignant neoplasm of breast: Secondary | ICD-10-CM | POA: Diagnosis present

## 2015-01-04 ENCOUNTER — Ambulatory Visit (HOSPITAL_COMMUNITY)
Admission: RE | Admit: 2015-01-04 | Discharge: 2015-01-04 | Disposition: A | Discharge status: Home / Self Care | Payer: 59 | Source: Ambulatory Visit | Attending: Family Medicine | Admitting: Family Medicine

## 2015-01-04 DIAGNOSIS — M5136 Other intervertebral disc degeneration, lumbar region: Secondary | ICD-10-CM | POA: Diagnosis not present

## 2015-01-04 DIAGNOSIS — M545 Low back pain: Secondary | ICD-10-CM | POA: Diagnosis present

## 2015-01-04 DIAGNOSIS — M5440 Lumbago with sciatica, unspecified side: Secondary | ICD-10-CM

## 2015-01-08 ENCOUNTER — Other Ambulatory Visit (HOSPITAL_COMMUNITY): Payer: Self-pay | Admitting: Family Medicine

## 2015-01-08 DIAGNOSIS — M25559 Pain in unspecified hip: Secondary | ICD-10-CM

## 2015-01-12 ENCOUNTER — Other Ambulatory Visit (HOSPITAL_COMMUNITY): Payer: Self-pay | Admitting: Family Medicine

## 2015-01-12 ENCOUNTER — Ambulatory Visit (HOSPITAL_COMMUNITY)
Admission: RE | Admit: 2015-01-12 | Discharge: 2015-01-12 | Disposition: A | Discharge status: Home / Self Care | Payer: 59 | Source: Ambulatory Visit | Attending: Family Medicine | Admitting: Family Medicine

## 2015-01-12 DIAGNOSIS — R102 Pelvic and perineal pain: Secondary | ICD-10-CM | POA: Diagnosis present

## 2015-01-12 DIAGNOSIS — M25559 Pain in unspecified hip: Secondary | ICD-10-CM

## 2015-01-12 DIAGNOSIS — M160 Bilateral primary osteoarthritis of hip: Secondary | ICD-10-CM | POA: Diagnosis not present

## 2015-01-12 DIAGNOSIS — G8929 Other chronic pain: Secondary | ICD-10-CM | POA: Diagnosis not present

## 2015-02-21 ENCOUNTER — Other Ambulatory Visit: Payer: Self-pay | Admitting: Obstetrics and Gynecology

## 2015-02-22 LAB — CYTOLOGY - PAP

## 2015-06-22 MED FILL — valACYclovir HCL 1 GM TABS: 1 | 30 days supply | Qty: 30 | Fill #0

## 2015-08-07 MED FILL — LOSARTAN-HCTZ 50-12.5 MG TA: 50-12.5 | 90 days supply | Qty: 90 | Fill #1

## 2015-08-07 MED FILL — FUROSEMIDE 40 MG TABLET: 40 | 90 days supply | Qty: 90 | Fill #1

## 2015-08-07 MED FILL — SYNTHROID 150 MCG TABLET: 150 | 90 days supply | Qty: 90 | Fill #1

## 2015-08-07 MED FILL — valACYclovir HCL 1 GM TABS: 1 | 30 days supply | Qty: 30 | Fill #1

## 2015-08-27 DIAGNOSIS — B373 Candidiasis of vulva and vagina: Secondary | ICD-10-CM | POA: Diagnosis not present

## 2015-08-27 DIAGNOSIS — M7072 Other bursitis of hip, left hip: Secondary | ICD-10-CM | POA: Diagnosis not present

## 2015-08-27 DIAGNOSIS — J329 Chronic sinusitis, unspecified: Secondary | ICD-10-CM | POA: Diagnosis not present

## 2015-08-27 DIAGNOSIS — M25552 Pain in left hip: Secondary | ICD-10-CM | POA: Diagnosis not present

## 2015-10-22 MED FILL — valACYclovir HCL 1 GM TABS: 1 | 30 days supply | Qty: 30 | Fill #2

## 2015-11-09 MED FILL — SYNTHROID 150 MCG TABLET: 150 | 90 days supply | Qty: 90 | Fill #0

## 2015-11-09 MED FILL — FUROSEMIDE 40 MG TABLET: 40 | 90 days supply | Qty: 90 | Fill #0

## 2015-11-14 ENCOUNTER — Other Ambulatory Visit: Payer: Self-pay | Admitting: Family Medicine

## 2015-11-14 DIAGNOSIS — E785 Hyperlipidemia, unspecified: Secondary | ICD-10-CM | POA: Diagnosis not present

## 2015-11-14 DIAGNOSIS — N644 Mastodynia: Secondary | ICD-10-CM

## 2015-11-14 DIAGNOSIS — G4700 Insomnia, unspecified: Secondary | ICD-10-CM | POA: Diagnosis not present

## 2015-11-14 DIAGNOSIS — K219 Gastro-esophageal reflux disease without esophagitis: Secondary | ICD-10-CM | POA: Diagnosis not present

## 2015-11-14 DIAGNOSIS — E039 Hypothyroidism, unspecified: Secondary | ICD-10-CM | POA: Diagnosis not present

## 2015-11-14 DIAGNOSIS — M25552 Pain in left hip: Secondary | ICD-10-CM | POA: Diagnosis not present

## 2015-11-14 DIAGNOSIS — F419 Anxiety disorder, unspecified: Secondary | ICD-10-CM | POA: Diagnosis not present

## 2015-11-14 DIAGNOSIS — I10 Essential (primary) hypertension: Secondary | ICD-10-CM | POA: Diagnosis not present

## 2015-11-14 DIAGNOSIS — J329 Chronic sinusitis, unspecified: Secondary | ICD-10-CM | POA: Diagnosis not present

## 2015-11-15 HISTORY — PX: BREAST BIOPSY: SHX20

## 2015-11-15 MED FILL — CLINDAMYCIN HCL 300 MG CAP: 300 | 14 days supply | Qty: 42 | Fill #0

## 2015-11-15 MED FILL — FLUCONAZOLE 150 MG TABLET: 150 | 2 days supply | Qty: 2 | Fill #0

## 2015-11-19 ENCOUNTER — Ambulatory Visit
Admission: RE | Admit: 2015-11-19 | Discharge: 2015-11-19 | Disposition: A | Discharge status: Home / Self Care | Payer: 59 | Source: Ambulatory Visit | Attending: Family Medicine | Admitting: Family Medicine

## 2015-11-19 ENCOUNTER — Other Ambulatory Visit: Payer: Self-pay | Admitting: Family Medicine

## 2015-11-19 DIAGNOSIS — N63 Unspecified lump in breast: Secondary | ICD-10-CM | POA: Diagnosis not present

## 2015-11-19 DIAGNOSIS — N644 Mastodynia: Secondary | ICD-10-CM

## 2015-11-19 DIAGNOSIS — N6002 Solitary cyst of left breast: Secondary | ICD-10-CM | POA: Diagnosis not present

## 2015-11-19 DIAGNOSIS — N6320 Unspecified lump in the left breast, unspecified quadrant: Secondary | ICD-10-CM

## 2015-11-22 ENCOUNTER — Ambulatory Visit
Admission: RE | Admit: 2015-11-22 | Discharge: 2015-11-22 | Disposition: A | Discharge status: Home / Self Care | Payer: 59 | Source: Ambulatory Visit | Attending: Family Medicine | Admitting: Family Medicine

## 2015-11-22 ENCOUNTER — Other Ambulatory Visit: Payer: Self-pay | Admitting: Family Medicine

## 2015-11-22 DIAGNOSIS — N6320 Unspecified lump in the left breast, unspecified quadrant: Secondary | ICD-10-CM

## 2015-11-22 DIAGNOSIS — N63 Unspecified lump in breast: Secondary | ICD-10-CM | POA: Diagnosis not present

## 2015-11-22 DIAGNOSIS — N6012 Diffuse cystic mastopathy of left breast: Secondary | ICD-10-CM | POA: Diagnosis not present

## 2015-12-10 MED FILL — FLUCONAZOLE 150 MG TABLET: 150 | 2 days supply | Qty: 2 | Fill #1

## 2015-12-10 MED FILL — CLINDAMYCIN HCL 300 MG CAPS: 300 | 14 days supply | Qty: 42 | Fill #1

## 2015-12-24 DIAGNOSIS — M25551 Pain in right hip: Secondary | ICD-10-CM | POA: Diagnosis not present

## 2016-01-04 MED FILL — CLINDAMYCIN HCL 300 MG CAPS: 300 | 14 days supply | Qty: 42 | Fill #2

## 2016-01-04 MED FILL — FLUCONAZOLE 150 MG TABLET: 150 | 2 days supply | Qty: 2 | Fill #2

## 2016-01-04 MED FILL — valACYclovir HCL 1 GM TABS: 1 | 30 days supply | Qty: 30 | Fill #3

## 2016-01-30 DIAGNOSIS — Z1231 Encounter for screening mammogram for malignant neoplasm of breast: Secondary | ICD-10-CM | POA: Diagnosis not present

## 2016-01-30 MED FILL — CELECOXIB 200 MG CAPSULE: 200 | 90 days supply | Qty: 90 | Fill #0

## 2016-01-30 MED FILL — LOSARTAN-HCTZ 50-12.5 MG TA: 50-12.5 | 90 days supply | Qty: 90 | Fill #0

## 2016-01-30 MED FILL — valACYclovir HCL 1 GM TABS: 1 | 30 days supply | Qty: 30 | Fill #4

## 2016-01-30 MED FILL — LIVALO 4 MG TABLET: 4 | 90 days supply | Qty: 90 | Fill #0

## 2016-01-30 MED FILL — ESOMEPRAZOLE MAG DR 40 MG C: 40 | 90 days supply | Qty: 90 | Fill #0

## 2016-01-31 DIAGNOSIS — M542 Cervicalgia: Secondary | ICD-10-CM | POA: Diagnosis not present

## 2016-02-06 DIAGNOSIS — S62609A Fracture of unspecified phalanx of unspecified finger, initial encounter for closed fracture: Secondary | ICD-10-CM | POA: Diagnosis not present

## 2016-02-13 ENCOUNTER — Encounter (HOSPITAL_BASED_OUTPATIENT_CLINIC_OR_DEPARTMENT_OTHER): Payer: Self-pay | Admitting: *Deleted

## 2016-02-13 ENCOUNTER — Other Ambulatory Visit: Payer: Self-pay | Admitting: Orthopedic Surgery

## 2016-02-13 DIAGNOSIS — M7989 Other specified soft tissue disorders: Secondary | ICD-10-CM | POA: Diagnosis not present

## 2016-02-13 DIAGNOSIS — S62617A Displaced fracture of proximal phalanx of left little finger, initial encounter for closed fracture: Secondary | ICD-10-CM | POA: Diagnosis not present

## 2016-02-13 DIAGNOSIS — M79642 Pain in left hand: Secondary | ICD-10-CM | POA: Diagnosis not present

## 2016-02-13 DIAGNOSIS — S62617D Displaced fracture of proximal phalanx of left little finger, subsequent encounter for fracture with routine healing: Secondary | ICD-10-CM | POA: Diagnosis not present

## 2016-02-14 ENCOUNTER — Ambulatory Visit (HOSPITAL_BASED_OUTPATIENT_CLINIC_OR_DEPARTMENT_OTHER): Payer: 59 | Admitting: Anesthesiology

## 2016-02-14 ENCOUNTER — Ambulatory Visit (HOSPITAL_BASED_OUTPATIENT_CLINIC_OR_DEPARTMENT_OTHER)
Admission: RE | Admit: 2016-02-14 | Discharge: 2016-02-14 | Disposition: A | Discharge status: Home / Self Care | Payer: 59 | Source: Ambulatory Visit | Attending: Orthopedic Surgery | Admitting: Orthopedic Surgery

## 2016-02-14 ENCOUNTER — Encounter (HOSPITAL_BASED_OUTPATIENT_CLINIC_OR_DEPARTMENT_OTHER)
Admission: RE | Disposition: A | Discharge status: Home / Self Care | Payer: Self-pay | Source: Ambulatory Visit | Attending: Orthopedic Surgery

## 2016-02-14 ENCOUNTER — Encounter (HOSPITAL_BASED_OUTPATIENT_CLINIC_OR_DEPARTMENT_OTHER): Payer: Self-pay | Admitting: *Deleted

## 2016-02-14 DIAGNOSIS — Z880 Allergy status to penicillin: Secondary | ICD-10-CM | POA: Insufficient documentation

## 2016-02-14 DIAGNOSIS — Z8614 Personal history of Methicillin resistant Staphylococcus aureus infection: Secondary | ICD-10-CM | POA: Diagnosis not present

## 2016-02-14 DIAGNOSIS — M79645 Pain in left finger(s): Secondary | ICD-10-CM | POA: Diagnosis not present

## 2016-02-14 DIAGNOSIS — S62617A Displaced fracture of proximal phalanx of left little finger, initial encounter for closed fracture: Secondary | ICD-10-CM | POA: Diagnosis not present

## 2016-02-14 DIAGNOSIS — X58XXXA Exposure to other specified factors, initial encounter: Secondary | ICD-10-CM | POA: Diagnosis not present

## 2016-02-14 DIAGNOSIS — K219 Gastro-esophageal reflux disease without esophagitis: Secondary | ICD-10-CM | POA: Diagnosis not present

## 2016-02-14 DIAGNOSIS — E039 Hypothyroidism, unspecified: Secondary | ICD-10-CM | POA: Insufficient documentation

## 2016-02-14 DIAGNOSIS — I10 Essential (primary) hypertension: Secondary | ICD-10-CM | POA: Diagnosis not present

## 2016-02-14 DIAGNOSIS — E785 Hyperlipidemia, unspecified: Secondary | ICD-10-CM | POA: Diagnosis not present

## 2016-02-14 DIAGNOSIS — G8918 Other acute postprocedural pain: Secondary | ICD-10-CM | POA: Diagnosis not present

## 2016-02-14 HISTORY — DX: Gastro-esophageal reflux disease without esophagitis: K21.9

## 2016-02-14 HISTORY — DX: Hypothyroidism, unspecified: E03.9

## 2016-02-14 HISTORY — PX: OPEN REDUCTION INTERNAL FIXATION (ORIF) PROXIMAL PHALANX: SHX6235

## 2016-02-14 HISTORY — DX: Anxiety disorder, unspecified: F41.9

## 2016-02-14 HISTORY — DX: Angina pectoris, unspecified: I20.9

## 2016-02-14 LAB — POCT I-STAT, CHEM 8
BUN: 16 mg/dL (ref 6–20)
Calcium, Ion: 1.25 mmol/L (ref 1.15–1.40)
Chloride: 103 mmol/L (ref 101–111)
Creatinine, Ser: 1.1 mg/dL — ABNORMAL HIGH (ref 0.44–1.00)
Glucose, Bld: 95 mg/dL (ref 65–99)
HCT: 39 % (ref 36.0–46.0)
Hemoglobin: 13.3 g/dL (ref 12.0–15.0)
Potassium: 3.8 mmol/L (ref 3.5–5.1)
Sodium: 141 mmol/L (ref 135–145)
TCO2: 25 mmol/L (ref 0–100)

## 2016-02-14 SURGERY — OPEN REDUCTION INTERNAL FIXATION (ORIF) PROXIMAL PHALANX
Anesthesia: Regional | Site: Hand | Laterality: Left

## 2016-02-14 MED ORDER — FENTANYL CITRATE (PF) 100 MCG/2ML IJ SOLN
INTRAMUSCULAR | Status: AC
  Filled 2016-02-14: qty 2

## 2016-02-14 MED ORDER — VANCOMYCIN HCL IN DEXTROSE 1-5 GM/200ML-% IV SOLN
INTRAVENOUS | Status: AC
  Filled 2016-02-14: qty 200

## 2016-02-14 MED ORDER — SCOPOLAMINE 1 MG/3DAYS TD PT72: 1.0000 | MEDICATED_PATCH | Freq: Once | TRANSDERMAL | Status: DC | PRN

## 2016-02-14 MED ORDER — BUPIVACAINE HCL (PF) 0.25 % IJ SOLN
INTRAMUSCULAR | Status: AC
  Filled 2016-02-14: qty 60

## 2016-02-14 MED ORDER — PROMETHAZINE HCL 25 MG/ML IJ SOLN: 6.2500 mg | INTRAMUSCULAR | Status: DC | PRN

## 2016-02-14 MED ORDER — DEXAMETHASONE SODIUM PHOSPHATE 10 MG/ML IJ SOLN
INTRAMUSCULAR | Status: AC
  Filled 2016-02-14: qty 1

## 2016-02-14 MED ORDER — HYDROCODONE-ACETAMINOPHEN 7.5-325 MG PO TABS
1.0000 | ORAL_TABLET | Freq: Once | ORAL | Status: AC | PRN
  Administered 2016-02-14: 1 via ORAL

## 2016-02-14 MED ORDER — PROPOFOL 10 MG/ML IV BOLUS
INTRAVENOUS | Status: AC
  Filled 2016-02-14: qty 20

## 2016-02-14 MED ORDER — ONDANSETRON HCL 4 MG/2ML IJ SOLN
INTRAMUSCULAR | Status: DC | PRN
  Administered 2016-02-14: 4 mg via INTRAVENOUS

## 2016-02-14 MED ORDER — VANCOMYCIN HCL 1000 MG IV SOLR
INTRAVENOUS | Status: DC | PRN
  Administered 2016-02-14: 1000 mg via INTRAVENOUS

## 2016-02-14 MED ORDER — LIDOCAINE 2% (20 MG/ML) 5 ML SYRINGE
INTRAMUSCULAR | Status: DC | PRN
  Administered 2016-02-14: 20 mg via INTRAVENOUS

## 2016-02-14 MED ORDER — ONDANSETRON HCL 4 MG/2ML IJ SOLN
INTRAMUSCULAR | Status: AC
  Filled 2016-02-14: qty 2

## 2016-02-14 MED ORDER — MIDAZOLAM HCL 2 MG/2ML IJ SOLN
INTRAMUSCULAR | Status: AC
  Filled 2016-02-14: qty 2

## 2016-02-14 MED ORDER — LACTATED RINGERS IV SOLN
INTRAVENOUS | Status: DC
  Administered 2016-02-14 (×2): via INTRAVENOUS

## 2016-02-14 MED ORDER — HYDROCODONE-ACETAMINOPHEN 5-325 MG PO TABS
ORAL_TABLET | ORAL | Status: AC
  Filled 2016-02-14: qty 1

## 2016-02-14 MED ORDER — GLYCOPYRROLATE 0.2 MG/ML IJ SOLN: 0.2000 mg | Freq: Once | INTRAMUSCULAR | Status: DC | PRN

## 2016-02-14 MED ORDER — HYDROMORPHONE HCL 1 MG/ML IJ SOLN
0.2500 mg | INTRAMUSCULAR | Status: DC | PRN
  Administered 2016-02-14: 0.5 mg via INTRAVENOUS

## 2016-02-14 MED ORDER — HYDROMORPHONE HCL 1 MG/ML IJ SOLN
INTRAMUSCULAR | Status: AC
  Filled 2016-02-14: qty 1

## 2016-02-14 MED ORDER — LIDOCAINE 2% (20 MG/ML) 5 ML SYRINGE
INTRAMUSCULAR | Status: AC
  Filled 2016-02-14: qty 5

## 2016-02-14 MED ORDER — PROPOFOL 10 MG/ML IV BOLUS
INTRAVENOUS | Status: DC | PRN
  Administered 2016-02-14: 150 mg via INTRAVENOUS

## 2016-02-14 MED ORDER — MIDAZOLAM HCL 2 MG/2ML IJ SOLN
1.0000 mg | INTRAMUSCULAR | Status: DC | PRN
Start: 2016-02-14 — End: 2016-02-14
  Administered 2016-02-14: 2 mg via INTRAVENOUS

## 2016-02-14 MED ORDER — DEXAMETHASONE SODIUM PHOSPHATE 4 MG/ML IJ SOLN
INTRAMUSCULAR | Status: DC | PRN
  Administered 2016-02-14: 10 mg via INTRAVENOUS

## 2016-02-14 MED ORDER — FENTANYL CITRATE (PF) 100 MCG/2ML IJ SOLN
50.0000 ug | INTRAMUSCULAR | Status: DC | PRN
  Administered 2016-02-14: 100 ug via INTRAVENOUS

## 2016-02-14 MED ORDER — BUPIVACAINE-EPINEPHRINE (PF) 0.5% -1:200000 IJ SOLN
INTRAMUSCULAR | Status: DC | PRN
  Administered 2016-02-14: 30 mL via PERINEURAL

## 2016-02-14 MED ORDER — LIDOCAINE HCL (PF) 1 % IJ SOLN
INTRAMUSCULAR | Status: AC
  Filled 2016-02-14: qty 60

## 2016-02-14 SURGICAL SUPPLY — 62 items
APL SKNCLS STERI-STRIP NONHPOA (GAUZE/BANDAGES/DRESSINGS) ×1
BANDAGE COBAN STERILE 2 (GAUZE/BANDAGES/DRESSINGS) IMPLANT
BENZOIN TINCTURE PRP APPL 2/3 (GAUZE/BANDAGES/DRESSINGS) ×1 IMPLANT
BIT DRILL 1.1 MINI (BIT) IMPLANT
BIT DRILL 6MM (BIT) IMPLANT
BLADE MINI RND TIP GREEN BEAV (BLADE) ×1 IMPLANT
BLADE SURG 15 STRL LF DISP TIS (BLADE) ×1 IMPLANT
BLADE SURG 15 STRL SS (BLADE) ×2
BNDG CMPR 9X4 STRL LF SNTH (GAUZE/BANDAGES/DRESSINGS) ×1
BNDG COHESIVE 1X5 TAN STRL LF (GAUZE/BANDAGES/DRESSINGS) ×1 IMPLANT
BNDG COHESIVE 3X5 TAN STRL LF (GAUZE/BANDAGES/DRESSINGS) ×2 IMPLANT
BNDG ESMARK 4X9 LF (GAUZE/BANDAGES/DRESSINGS) ×2 IMPLANT
BNDG GAUZE ELAST 4 BULKY (GAUZE/BANDAGES/DRESSINGS) IMPLANT
CHLORAPREP W/TINT 26ML (MISCELLANEOUS) ×2 IMPLANT
CLSR STERI-STRIP ANTIMIC 1/2X4 (GAUZE/BANDAGES/DRESSINGS) ×1 IMPLANT
CORDS BIPOLAR (ELECTRODE) ×2 IMPLANT
COVER BACK TABLE 60X90IN (DRAPES) ×2 IMPLANT
COVER MAYO STAND STRL (DRAPES) ×2 IMPLANT
CUFF TOURNIQUET SINGLE 18IN (TOURNIQUET CUFF) ×1 IMPLANT
DECANTER SPIKE VIAL GLASS SM (MISCELLANEOUS) IMPLANT
DRAPE EXTREMITY T 121X128X90 (DRAPE) ×2 IMPLANT
DRAPE OEC MINIVIEW 54X84 (DRAPES) ×2 IMPLANT
DRAPE SURG 17X23 STRL (DRAPES) ×2 IMPLANT
DRILL BIT 1.1 MINI (BIT) ×2
DRILL BIT 6MM (BIT) ×2
GAUZE SPONGE 4X4 12PLY STRL (GAUZE/BANDAGES/DRESSINGS) ×2 IMPLANT
GAUZE XEROFORM 1X8 LF (GAUZE/BANDAGES/DRESSINGS) ×2 IMPLANT
GLOVE BIOGEL PI IND STRL 8.5 (GLOVE) ×1 IMPLANT
GLOVE BIOGEL PI INDICATOR 8.5 (GLOVE) ×1
GLOVE SURG ORTHO 8.0 STRL STRW (GLOVE) ×2 IMPLANT
GOWN STRL REUS W/ TWL LRG LVL3 (GOWN DISPOSABLE) IMPLANT
GOWN STRL REUS W/TWL LRG LVL3 (GOWN DISPOSABLE)
GOWN STRL REUS W/TWL XL LVL3 (GOWN DISPOSABLE) ×2 IMPLANT
K-WIRE PROS .028 4 (WIRE) ×1 IMPLANT
NDL PRECISIONGLIDE 27X1.5 (NEEDLE) IMPLANT
NEEDLE PRECISIONGLIDE 27X1.5 (NEEDLE) ×2 IMPLANT
NS IRRIG 1000ML POUR BTL (IV SOLUTION) ×2 IMPLANT
PACK BASIN DAY SURGERY FS (CUSTOM PROCEDURE TRAY) ×2 IMPLANT
PAD CAST 3X4 CTTN HI CHSV (CAST SUPPLIES) ×1 IMPLANT
PADDING CAST ABS 3INX4YD NS (CAST SUPPLIES) ×1
PADDING CAST ABS 4INX4YD NS (CAST SUPPLIES) ×1
PADDING CAST ABS COTTON 3X4 (CAST SUPPLIES) IMPLANT
PADDING CAST ABS COTTON 4X4 ST (CAST SUPPLIES) ×1 IMPLANT
PADDING CAST COTTON 3X4 STRL (CAST SUPPLIES) ×2
SCREW SELF TAP CORTEX 1.0 6MM (Screw) ×2 IMPLANT
SCREW SELF TAP CORTEX 1.3 6MM (Screw) ×2 IMPLANT
SCREW SELF TAP CORTEX 1.3 8MM (Screw) ×1 IMPLANT
SLEEVE SCD COMPRESS KNEE MED (MISCELLANEOUS) ×2 IMPLANT
SLING ARM IMMOBILIZER MED (SOFTGOODS) ×1 IMPLANT
SPLINT PLASTER CAST XFAST 3X15 (CAST SUPPLIES) IMPLANT
SPLINT PLASTER XTRA FASTSET 3X (CAST SUPPLIES) ×1
SPONGE GAUZE 4X4 12PLY STER LF (GAUZE/BANDAGES/DRESSINGS) ×1 IMPLANT
STOCKINETTE 4X48 STRL (DRAPES) ×2 IMPLANT
SUT CHROMIC 5 0 P 3 (SUTURE) ×1 IMPLANT
SUT CHROMIC 6 0 PS 4 (SUTURE) IMPLANT
SUT ETHILON 4 0 PS 2 18 (SUTURE) ×2 IMPLANT
SUT MERSILENE 4 0 P 3 (SUTURE) ×2 IMPLANT
SUT VICRYL 4-0 PS2 18IN ABS (SUTURE) ×1 IMPLANT
SYR BULB 3OZ (MISCELLANEOUS) ×2 IMPLANT
SYR CONTROL 10ML LL (SYRINGE) IMPLANT
TOWEL OR 17X24 6PK STRL BLUE (TOWEL DISPOSABLE) ×2 IMPLANT
UNDERPAD 30X30 (UNDERPADS AND DIAPERS) ×2 IMPLANT

## 2016-02-14 NOTE — Anesthesia Procedure Notes (Signed)
Procedure Name: LMA Insertion Date/Time: 02/14/2016 7:49 AM Performed by: Lyndee Leo Pre-anesthesia Checklist: Patient identified, Emergency Drugs available, Suction available and Patient being monitored Patient Re-evaluated:Patient Re-evaluated prior to inductionOxygen Delivery Method: Circle system utilized Preoxygenation: Pre-oxygenation with 100% oxygen Intubation Type: IV induction Ventilation: Mask ventilation without difficulty LMA: LMA inserted LMA Size: 4.0 Number of attempts: 1 Airway Equipment and Method: Bite block Placement Confirmation: positive ETCO2 Tube secured with: Tape Dental Injury: Teeth and Oropharynx as per pre-operative assessment

## 2016-02-14 NOTE — Op Note (Signed)
Dictation Number [id number]

## 2016-02-14 NOTE — Discharge Instructions (Addendum)

## 2016-02-14 NOTE — Anesthesia Postprocedure Evaluation (Signed)
Anesthesia Post Note  Patient: Amber Werner  Procedure(s) Performed: Procedure(s) (LRB): OPEN REDUCTION INTERNAL FIXATION (ORIF) PROXIMAL PHALANX LEFT SMALL FINGER (Left)  Patient location during evaluation: PACU Anesthesia Type: General and Regional Level of consciousness: awake and alert Pain management: pain level controlled Vital Signs Assessment: post-procedure vital signs reviewed and stable Respiratory status: spontaneous breathing, nonlabored ventilation, respiratory function stable and patient connected to nasal cannula oxygen Cardiovascular status: blood pressure returned to baseline and stable Postop Assessment: no signs of nausea or vomiting Anesthetic complications: no    Last Vitals:  Vitals:   02/14/16 0930 02/14/16 0945  BP: (!) 114/58   Pulse: 64 (!) 56  Resp: 19 12  Temp:      Last Pain:  Vitals:   02/14/16 0930  TempSrc:   PainSc: 0-No pain                 Tiajuana Amass

## 2016-02-14 NOTE — Anesthesia Preprocedure Evaluation (Signed)
Anesthesia Evaluation  Patient identified by MRN, date of birth, ID band Patient awake    Reviewed: Allergy & Precautions, NPO status , Patient's Chart, lab work & pertinent test results  Airway Mallampati: II  TM Distance: >3 FB     Dental   Pulmonary neg pulmonary ROS,    Pulmonary exam normal        Cardiovascular hypertension, Pt. on medications Normal cardiovascular exam     Neuro/Psych negative neurological ROS     GI/Hepatic Neg liver ROS, GERD  ,  Endo/Other  Hypothyroidism   Renal/GU negative Renal ROS     Musculoskeletal   Abdominal   Peds  Hematology negative hematology ROS (+)   Anesthesia Other Findings   Reproductive/Obstetrics                             Anesthesia Physical Anesthesia Plan  ASA: II  Anesthesia Plan: General and Regional   Post-op Pain Management:  Regional for Post-op pain   Induction: Intravenous  Airway Management Planned: LMA  Additional Equipment:   Intra-op Plan:   Post-operative Plan: Extubation in OR  Informed Consent: I have reviewed the patients History and Physical, chart, labs and discussed the procedure including the risks, benefits and alternatives for the proposed anesthesia with the patient or authorized representative who has indicated his/her understanding and acceptance.   Dental advisory given  Plan Discussed with:   Anesthesia Plan Comments:         Anesthesia Quick Evaluation

## 2016-02-14 NOTE — Brief Op Note (Signed)
02/14/2016  8:58 AM  PATIENT:  Frances Furbish  60 y.o. female  PRE-OPERATIVE DIAGNOSIS:  FRACTURE PROXIMAL PHALANX LEFT SMALL FINGER  POST-OPERATIVE DIAGNOSIS:  FRACTURE PROXIMAL PHALANXLEFT SMALL FINGER  PROCEDURE:  Procedure(s): OPEN REDUCTION INTERNAL FIXATION (ORIF) PROXIMAL PHALANX LEFT SMALL FINGER (Left)  SURGEON:  Surgeon(s) and Role:    * Daryll Brod, MD - Primary    * Leanora Cover, MD - Assisting  PHYSICIAN ASSISTANT:   ASSISTANTS: K Jrue Jarriel,MD   ANESTHESIA:   regional and general  EBL:  Total I/O In: 1000 [I.V.:1000] Out: -   BLOOD ADMINISTERED:none  DRAINS: none   LOCAL MEDICATIONS USED:  NONE  SPECIMEN:  No Specimen  DISPOSITION OF SPECIMEN:  N/A  COUNTS:  YES  TOURNIQUET:   Total Tourniquet Time Documented: Upper Arm (Left) - 50 minutes Total: Upper Arm (Left) - 50 minutes   DICTATION: .Other Dictation: Dictation Number [id number]  PLAN OF CARE: Discharge to home after PACU  PATIENT DISPOSITION:  PACU - hemodynamically stable.

## 2016-02-14 NOTE — Transfer of Care (Signed)
Immediate Anesthesia Transfer of Care Note  Patient: Amber Werner  Procedure(s) Performed: Procedure(s): OPEN REDUCTION INTERNAL FIXATION (ORIF) PROXIMAL PHALANX LEFT SMALL FINGER (Left)  Patient Location: PACU  Anesthesia Type:GA combined with regional for post-op pain  Level of Consciousness: awake, sedated and patient cooperative  Airway & Oxygen Therapy: Patient Spontanous Breathing and Patient connected to face mask oxygen  Post-op Assessment: Report given to RN and Post -op Vital signs reviewed and stable  Post vital signs: Reviewed and stable  Last Vitals:  Vitals:   02/14/16 0738 02/14/16 0739  BP:    Pulse:    Resp: 15 12  Temp:      Last Pain:  Vitals:   02/14/16 0622  TempSrc: Oral  PainSc: 8       Patients Stated Pain Goal: 4 ([id number])  Complications: No apparent anesthesia complications

## 2016-02-14 NOTE — Progress Notes (Signed)
Assisted Dr. Rob Fitzgerald with left, ultrasound guided, axillary block. Side rails up, monitors on throughout procedure. See vital signs in flow sheet. Tolerated Procedure well. 
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
## 2016-02-14 NOTE — Anesthesia Procedure Notes (Signed)
Anesthesia Regional Block:  Axillary brachial plexus block  Pre-Anesthetic Checklist: ,, timeout performed, Correct Patient, Correct Site, Correct Laterality, Correct Procedure, Correct Position, site marked, Risks and benefits discussed,  Surgical consent,  Pre-op evaluation,  At surgeon's request and post-op pain management  Laterality: Left  Prep: chloraprep       Needles:   Needle Type: Echogenic Stimulator Needle     Needle Length: 9cm 9 cm Needle Gauge: 21 and 21 G    Additional Needles:  Procedures: ultrasound guided (picture in chart) and nerve stimulator Axillary brachial plexus block  Nerve Stimulator or Paresthesia:  Response: median, radial and ulnar , 0.5 mA,   Additional Responses:   Narrative:  Start time: 02/14/2016 7:30 AM End time: 02/14/2016 7:38 AM Injection made incrementally with aspirations every 5 mL.  Performed by: Personally  Anesthesiologist: Suzette Battiest

## 2016-02-15 ENCOUNTER — Encounter (HOSPITAL_BASED_OUTPATIENT_CLINIC_OR_DEPARTMENT_OTHER): Payer: Self-pay | Admitting: Orthopedic Surgery

## 2016-02-15 NOTE — Op Note (Signed)
I assisted Surgeon(s) and Role:    * Daryll Brod, MD - Primary    * Leanora Cover, MD - Assisting on the Procedure(s): OPEN REDUCTION INTERNAL FIXATION (ORIF) PROXIMAL PHALANX LEFT SMALL FINGER on 02/14/2016.  I provided assistance on this case as follows: retraction of soft tissue, reduction of fracture, placement of hardware.  Electronically signed by: Tennis Must, MD Date: 02/15/2016 Time: 3:00 PM

## 2016-02-15 NOTE — Addendum Note (Signed)
Addendum  created 02/15/16 0930 by Marrianne Mood, CRNA   Charge Capture section accepted

## 2016-02-15 NOTE — Addendum Note (Signed)
Addendum  created 02/15/16 [id number] by Marrianne Mood, CRNA   Charge Capture section accepted

## 2016-02-15 NOTE — Op Note (Signed)
NAMEMELAYA, Amber Werner NO.:  [id number]  MEDICAL RECORD NO.:  [id number]  LOCATION:                                 FACILITY:  PHYSICIAN:  Daryll Brod, M.D.            DATE OF BIRTH:  DATE OF PROCEDURE:  02/14/2016 DATE OF DISCHARGE:                              OPERATIVE REPORT   PREOPERATIVE DIAGNOSIS:  Comminuted fracture, left small finger, proximal phalanx.  POSTOPERATIVE DIAGNOSIS:  Comminuted fracture, left small finger, proximal phalanx.  OPERATION:  Open reduction of internal fixation, left small finger, proximal phalanx.  SURGEON:  Daryll Brod, M.D.  ASSISTANT:  Leanora Cover, MD.  ANESTHESIA:  Supraclavicular block and general.  ANESTHESIOLOGIST:  Soledad Gerlach, MD.  PLACE OF SURGERY:  [facility].  HISTORY:  The patient is a 60 year old female, who suffered an injury to her left small finger on a rope.  She has x-rays performed, which revealed a fracture of her proximal phalanx, small finger.  She is admitted for open reduction and internal fixation, in that, this is a long oblique fracture.  Pre, peri and postoperative course have been discussed along with risks and complications.  She is aware that there was no guarantee with the surgery; the possibility of infection; recurrence of injury to arteries, nerves, tendons; incomplete relief of symptoms; dystrophy; possibility of loss of fixation and nonunion.  In the preoperative area, the patient is seen, the extremity marked by both the patient and surgeon, and antibiotic given.  PROCEDURE IN DETAIL:  The patient was brought to the operating room.  A general anesthetic was carried out with her in a supine position with her left arm free.  A supraclavicular block was given in the preoperative area.  She was prepped using ChloraPrep.  A 3-minute dry time was allowed, a time-out was taken confirming the patient and procedure.  The limb was exsanguinated with an Esmarch bandage  and a tourniquet placed on the upper arm was inflated to 250 mmHg. Curvilinear incision was made over the dorsal aspect of the left small finger, carried to the ulnar side back over the metacarpophalangeal joint.  This was carried down through the subcutaneous tissue.  Bleeders were electrocauterized with bipolar.  The extensor tendon was then split longitudinally.  Periosteum was then incised and the tendon elevated from the periosteum.  The fracture was immediately apparent.  This was found to be more comminuted than visible on x-ray.  There was a butterfly fragment.  The fracture was then opened and debrided.  This was done with house curettes and allowed removal of the granulation tissue.  The fracture was then reduced and attempted to be pinned with a 2.8 K-wire.  This did not fully reduce the fracture.  A phalangeal clamp was then able to be inserted.  The fracture lined was in a dorsal to palmar direction.  The screws were then placed after x-rays in AP, lateral and oblique direction and revealed that the fracture appeared well reduced with the clamp on.  Distally, the two 1.3-mm screws were then inserted, this measured 6 and 8 mm.  The butterfly fragment was then fixed proximally  with 1.1-mm screws.  These were each 6 mm.  X-rays were taken revealing that the most distal screw at 8 mm was slightly long and blocked full flexion of the PIP joint.  This was confirmed with x-rays.  The 8-mm screw was then removed and replaced with a 6-mm screw. The finger was able to be fully flex at this point in time.  X-rays in AP and lateral direction revealed that the fracture was appeared to be anatomically reduced.  There was no angulatory or rotatory deformity with full flexion of her fingers.  The wound was copiously irrigated with saline.  The periosteum was then closed as much as possible with running interrupted 6-0 chromic sutures.  The extensor tendon was then repaired with a running 4-0  Mersilene suture and with locking technique. The skin was then closed with a subcuticular 5-0 Monocryl suture. Benzoin and Steri-Strips were applied.  A sterile compressive dressing, dorsal splint was applied.  On deflation of the tourniquet, all fingers were immediately pinked.  She was taken to the recovery room for observation in satisfactory condition.  She will be discharged to home to return to the [place] in 1 week.  She had been given a prescription for pain medicine 1 day prior to surgery.          ______________________________ Daryll Brod, M.D.     GK/MEDQ  D:  02/14/2016  T:  02/15/2016  Job:  [id number]

## 2016-02-19 MED FILL — FUROSEMIDE 20 MG TABLET: 20 | 30 days supply | Qty: 60 | Fill #0

## 2016-02-19 MED FILL — SYNTHROID 150 MCG TABLET: 150 | 90 days supply | Qty: 90 | Fill #0

## 2016-02-22 DIAGNOSIS — M7989 Other specified soft tissue disorders: Secondary | ICD-10-CM | POA: Diagnosis not present

## 2016-02-22 DIAGNOSIS — M79642 Pain in left hand: Secondary | ICD-10-CM | POA: Diagnosis not present

## 2016-02-22 DIAGNOSIS — S62617A Displaced fracture of proximal phalanx of left little finger, initial encounter for closed fracture: Secondary | ICD-10-CM | POA: Diagnosis not present

## 2016-02-22 DIAGNOSIS — S62617D Displaced fracture of proximal phalanx of left little finger, subsequent encounter for fracture with routine healing: Secondary | ICD-10-CM | POA: Diagnosis not present

## 2016-03-04 DIAGNOSIS — M79645 Pain in left finger(s): Secondary | ICD-10-CM | POA: Diagnosis not present

## 2016-03-24 DIAGNOSIS — S62617D Displaced fracture of proximal phalanx of left little finger, subsequent encounter for fracture with routine healing: Secondary | ICD-10-CM | POA: Diagnosis not present

## 2016-03-24 DIAGNOSIS — M79645 Pain in left finger(s): Secondary | ICD-10-CM | POA: Diagnosis not present

## 2016-03-27 DIAGNOSIS — Z01419 Encounter for gynecological examination (general) (routine) without abnormal findings: Secondary | ICD-10-CM | POA: Diagnosis not present

## 2016-03-27 DIAGNOSIS — Z803 Family history of malignant neoplasm of breast: Secondary | ICD-10-CM | POA: Diagnosis not present

## 2016-03-27 DIAGNOSIS — Z6828 Body mass index (BMI) 28.0-28.9, adult: Secondary | ICD-10-CM | POA: Diagnosis not present

## 2016-03-28 ENCOUNTER — Other Ambulatory Visit (HOSPITAL_COMMUNITY): Payer: Self-pay | Admitting: Obstetrics and Gynecology

## 2016-03-28 DIAGNOSIS — Z803 Family history of malignant neoplasm of breast: Secondary | ICD-10-CM

## 2016-03-31 MED FILL — FUROSEMIDE 20 MG TABLET: 20 | 30 days supply | Qty: 60 | Fill #1

## 2016-04-18 ENCOUNTER — Other Ambulatory Visit: Payer: Self-pay | Admitting: Orthopedic Surgery

## 2016-04-18 DIAGNOSIS — S62617D Displaced fracture of proximal phalanx of left little finger, subsequent encounter for fracture with routine healing: Secondary | ICD-10-CM | POA: Diagnosis not present

## 2016-04-22 ENCOUNTER — Encounter (HOSPITAL_BASED_OUTPATIENT_CLINIC_OR_DEPARTMENT_OTHER): Payer: Self-pay | Admitting: *Deleted

## 2016-04-25 ENCOUNTER — Encounter (HOSPITAL_BASED_OUTPATIENT_CLINIC_OR_DEPARTMENT_OTHER)
Admission: RE | Admit: 2016-04-25 | Discharge: 2016-04-25 | Disposition: A | Discharge status: Home / Self Care | Payer: 59 | Source: Ambulatory Visit | Attending: Orthopedic Surgery | Admitting: Orthopedic Surgery

## 2016-04-25 DIAGNOSIS — Z01818 Encounter for other preprocedural examination: Secondary | ICD-10-CM | POA: Insufficient documentation

## 2016-04-25 LAB — BASIC METABOLIC PANEL
Anion gap: 11 (ref 5–15)
BUN: 14 mg/dL (ref 6–20)
CO2: 28 mmol/L (ref 22–32)
Calcium: 9.5 mg/dL (ref 8.9–10.3)
Chloride: 100 mmol/L — ABNORMAL LOW (ref 101–111)
Creatinine, Ser: 1.17 mg/dL — ABNORMAL HIGH (ref 0.44–1.00)
GFR calc Af Amer: 59 mL/min — ABNORMAL LOW (ref >60)
GFR calc non Af Amer: 51 mL/min — ABNORMAL LOW (ref >60)
Glucose, Bld: 88 mg/dL (ref 65–99)
Potassium: 3.6 mmol/L (ref 3.5–5.1)
Sodium: 139 mmol/L (ref 135–145)

## 2016-04-29 ENCOUNTER — Encounter (HOSPITAL_BASED_OUTPATIENT_CLINIC_OR_DEPARTMENT_OTHER): Payer: Self-pay | Admitting: Anesthesiology

## 2016-04-29 ENCOUNTER — Ambulatory Visit (HOSPITAL_BASED_OUTPATIENT_CLINIC_OR_DEPARTMENT_OTHER): Payer: 59 | Admitting: Anesthesiology

## 2016-04-29 ENCOUNTER — Encounter (HOSPITAL_BASED_OUTPATIENT_CLINIC_OR_DEPARTMENT_OTHER)
Admission: RE | Disposition: A | Discharge status: Home / Self Care | Payer: Self-pay | Source: Ambulatory Visit | Attending: Orthopedic Surgery

## 2016-04-29 ENCOUNTER — Ambulatory Visit (HOSPITAL_BASED_OUTPATIENT_CLINIC_OR_DEPARTMENT_OTHER)
Admission: RE | Admit: 2016-04-29 | Discharge: 2016-04-29 | Disposition: A | Discharge status: Home / Self Care | Payer: 59 | Source: Ambulatory Visit | Attending: Orthopedic Surgery | Admitting: Orthopedic Surgery

## 2016-04-29 DIAGNOSIS — E039 Hypothyroidism, unspecified: Secondary | ICD-10-CM | POA: Insufficient documentation

## 2016-04-29 DIAGNOSIS — Z79899 Other long term (current) drug therapy: Secondary | ICD-10-CM | POA: Insufficient documentation

## 2016-04-29 DIAGNOSIS — I10 Essential (primary) hypertension: Secondary | ICD-10-CM | POA: Insufficient documentation

## 2016-04-29 DIAGNOSIS — S62617D Displaced fracture of proximal phalanx of left little finger, subsequent encounter for fracture with routine healing: Secondary | ICD-10-CM | POA: Insufficient documentation

## 2016-04-29 DIAGNOSIS — F419 Anxiety disorder, unspecified: Secondary | ICD-10-CM | POA: Insufficient documentation

## 2016-04-29 DIAGNOSIS — Z7951 Long term (current) use of inhaled steroids: Secondary | ICD-10-CM | POA: Insufficient documentation

## 2016-04-29 DIAGNOSIS — E785 Hyperlipidemia, unspecified: Secondary | ICD-10-CM | POA: Diagnosis not present

## 2016-04-29 DIAGNOSIS — Z472 Encounter for removal of internal fixation device: Secondary | ICD-10-CM | POA: Diagnosis not present

## 2016-04-29 DIAGNOSIS — X58XXXD Exposure to other specified factors, subsequent encounter: Secondary | ICD-10-CM | POA: Insufficient documentation

## 2016-04-29 HISTORY — PX: HARDWARE REMOVAL: SHX979

## 2016-04-29 SURGERY — REMOVAL, HARDWARE
Anesthesia: Monitor Anesthesia Care | Site: Finger | Laterality: Left

## 2016-04-29 MED ORDER — FENTANYL CITRATE (PF) 100 MCG/2ML IJ SOLN: 25.0000 ug | INTRAMUSCULAR | Status: DC | PRN

## 2016-04-29 MED ORDER — 0.9 % SODIUM CHLORIDE (POUR BTL) OPTIME
TOPICAL | Status: DC | PRN
  Administered 2016-04-29: 200 mL

## 2016-04-29 MED ORDER — PROMETHAZINE HCL 25 MG/ML IJ SOLN: 6.2500 mg | INTRAMUSCULAR | Status: DC | PRN

## 2016-04-29 MED ORDER — MIDAZOLAM HCL 2 MG/2ML IJ SOLN
INTRAMUSCULAR | Status: AC
  Filled 2016-04-29: qty 2

## 2016-04-29 MED ORDER — VANCOMYCIN HCL IN DEXTROSE 1-5 GM/200ML-% IV SOLN
1000.0000 mg | INTRAVENOUS | Status: AC
  Administered 2016-04-29: 1000 mg via INTRAVENOUS

## 2016-04-29 MED ORDER — VANCOMYCIN HCL IN DEXTROSE 1-5 GM/200ML-% IV SOLN
INTRAVENOUS | Status: AC
  Filled 2016-04-29: qty 200

## 2016-04-29 MED ORDER — BUPIVACAINE HCL (PF) 0.25 % IJ SOLN
INTRAMUSCULAR | Status: DC | PRN
  Administered 2016-04-29: 5 mL

## 2016-04-29 MED ORDER — LACTATED RINGERS IV SOLN
INTRAVENOUS | Status: DC
  Administered 2016-04-29: 15:00:00 via INTRAVENOUS

## 2016-04-29 MED ORDER — LIDOCAINE 2% (20 MG/ML) 5 ML SYRINGE
INTRAMUSCULAR | Status: DC | PRN
Start: 2016-04-29 — End: 2016-04-29
  Administered 2016-04-29: 50 mg via INTRAVENOUS

## 2016-04-29 MED ORDER — FENTANYL CITRATE (PF) 100 MCG/2ML IJ SOLN
INTRAMUSCULAR | Status: AC
Start: 2016-04-29 — End: 2016-04-29
  Filled 2016-04-29: qty 2

## 2016-04-29 MED ORDER — ONDANSETRON HCL 4 MG/2ML IJ SOLN
INTRAMUSCULAR | Status: DC | PRN
  Administered 2016-04-29: 2 mg via INTRAVENOUS

## 2016-04-29 MED ORDER — SCOPOLAMINE 1 MG/3DAYS TD PT72: 1.0000 | MEDICATED_PATCH | Freq: Once | TRANSDERMAL | Status: DC | PRN

## 2016-04-29 MED ORDER — HYDROCODONE-ACETAMINOPHEN 5-325 MG PO TABS: 1.0000 | ORAL_TABLET | Freq: Four times a day (QID) | ORAL | 0 refills | Status: DC | PRN

## 2016-04-29 MED ORDER — CHLORHEXIDINE GLUCONATE 4 % EX LIQD: 60.0000 mL | Freq: Once | CUTANEOUS | Status: DC

## 2016-04-29 MED ORDER — PROPOFOL 500 MG/50ML IV EMUL
INTRAVENOUS | Status: DC | PRN
  Administered 2016-04-29: 100 ug/kg/min via INTRAVENOUS

## 2016-04-29 MED ORDER — MIDAZOLAM HCL 2 MG/2ML IJ SOLN
1.0000 mg | INTRAMUSCULAR | Status: DC | PRN
  Administered 2016-04-29: 2 mg via INTRAVENOUS

## 2016-04-29 MED ORDER — FENTANYL CITRATE (PF) 100 MCG/2ML IJ SOLN
50.0000 ug | INTRAMUSCULAR | Status: DC | PRN
  Administered 2016-04-29: 100 ug via INTRAVENOUS

## 2016-04-29 MED ORDER — LIDOCAINE 2% (20 MG/ML) 5 ML SYRINGE
INTRAMUSCULAR | Status: AC
  Filled 2016-04-29: qty 5

## 2016-04-29 SURGICAL SUPPLY — 42 items
BLADE SURG 15 STRL LF DISP TIS (BLADE) ×1 IMPLANT
BLADE SURG 15 STRL SS (BLADE) ×2
BNDG CMPR 9X4 STRL LF SNTH (GAUZE/BANDAGES/DRESSINGS)
BNDG COHESIVE 3X5 TAN STRL LF (GAUZE/BANDAGES/DRESSINGS) ×2 IMPLANT
BNDG ESMARK 4X9 LF (GAUZE/BANDAGES/DRESSINGS) IMPLANT
BNDG GAUZE ELAST 4 BULKY (GAUZE/BANDAGES/DRESSINGS) ×2 IMPLANT
CHLORAPREP W/TINT 26ML (MISCELLANEOUS) ×2 IMPLANT
CORDS BIPOLAR (ELECTRODE) ×2 IMPLANT
COVER BACK TABLE 60X90IN (DRAPES) ×2 IMPLANT
COVER MAYO STAND STRL (DRAPES) ×2 IMPLANT
CUFF TOURNIQUET SINGLE 18IN (TOURNIQUET CUFF) ×1 IMPLANT
DECANTER SPIKE VIAL GLASS SM (MISCELLANEOUS) IMPLANT
DRAPE EXTREMITY T 121X128X90 (DRAPE) ×2 IMPLANT
DRAPE OEC MINIVIEW 54X84 (DRAPES) ×1 IMPLANT
DRAPE SURG 17X23 STRL (DRAPES) ×2 IMPLANT
GAUZE SPONGE 4X4 12PLY STRL (GAUZE/BANDAGES/DRESSINGS) ×2 IMPLANT
GAUZE XEROFORM 1X8 LF (GAUZE/BANDAGES/DRESSINGS) ×2 IMPLANT
GLOVE BIOGEL PI IND STRL 7.0 (GLOVE) IMPLANT
GLOVE BIOGEL PI IND STRL 8.5 (GLOVE) ×1 IMPLANT
GLOVE BIOGEL PI INDICATOR 7.0 (GLOVE) ×1
GLOVE BIOGEL PI INDICATOR 8.5 (GLOVE) ×1
GLOVE ECLIPSE 6.5 STRL STRAW (GLOVE) ×1 IMPLANT
GLOVE SURG ORTHO 8.0 STRL STRW (GLOVE) ×2 IMPLANT
GLOVE SURG SS PI 7.0 STRL IVOR (GLOVE) ×2 IMPLANT
GOWN STRL REUS W/ TWL LRG LVL3 (GOWN DISPOSABLE) ×1 IMPLANT
GOWN STRL REUS W/TWL LRG LVL3 (GOWN DISPOSABLE) ×2
GOWN STRL REUS W/TWL XL LVL3 (GOWN DISPOSABLE) ×2 IMPLANT
NS IRRIG 1000ML POUR BTL (IV SOLUTION) ×2 IMPLANT
PACK BASIN DAY SURGERY FS (CUSTOM PROCEDURE TRAY) ×2 IMPLANT
PAD CAST 3X4 CTTN HI CHSV (CAST SUPPLIES) ×1 IMPLANT
PADDING CAST COTTON 3X4 STRL (CAST SUPPLIES) ×2
SPLINT FINGER 5.25 BULB (SOFTGOODS) ×1 IMPLANT
SPLINT PLASTER CAST XFAST 3X15 (CAST SUPPLIES) IMPLANT
SPLINT PLASTER XTRA FASTSET 3X (CAST SUPPLIES)
STOCKINETTE 4X48 STRL (DRAPES) ×1 IMPLANT
SUT CHROMIC 5 0 P 3 (SUTURE) IMPLANT
SUT ETHILON 4 0 PS 2 18 (SUTURE) ×2 IMPLANT
SUT MERSILENE 4 0 P 3 (SUTURE) ×1 IMPLANT
SUT VICRYL 4-0 PS2 18IN ABS (SUTURE) IMPLANT
SYR BULB 3OZ (MISCELLANEOUS) ×2 IMPLANT
SYR CONTROL 10ML LL (SYRINGE) IMPLANT
UNDERPAD 30X30 (UNDERPADS AND DIAPERS) ×2 IMPLANT

## 2016-04-29 NOTE — Brief Op Note (Signed)
04/29/2016  4:04 PM  PATIENT:  Frances Furbish  60 y.o. female  PRE-OPERATIVE DIAGNOSIS:  status post ORIF left small finger  POST-OPERATIVE DIAGNOSIS:  status post ORIF left small finger  PROCEDURE:  Procedure(s): Removal screws left small finger (Left)  SURGEON:  Surgeon(s) and Role:    * Daryll Brod, MD - Primary  PHYSICIAN ASSISTANT:   ASSISTANTS: none   ANESTHESIA:   local, regional and IV sedation  EBL:  Total I/O In: 300 [I.V.:300] Out: -   BLOOD ADMINISTERED:none  DRAINS: none   LOCAL MEDICATIONS USED:  BUPIVICAINE   SPECIMEN:  No Specimen  DISPOSITION OF SPECIMEN:  N/A  COUNTS:  YES  TOURNIQUET:   Total Tourniquet Time Documented: Upper Arm (Left) - 38 minutes Total: Upper Arm (Left) - 38 minutes   DICTATION: .Other Dictation: Dictation Number dictated on 04/29/16 @ 16:10  PLAN OF CARE: Discharge to home after PACU  PATIENT DISPOSITION:  PACU - hemodynamically stable.

## 2016-04-29 NOTE — Op Note (Signed)
Dictation Number dictated on 04/29/16 @ 16:10

## 2016-04-29 NOTE — Anesthesia Preprocedure Evaluation (Addendum)
Anesthesia Evaluation  Patient identified by MRN, date of birth, ID band Patient awake    Airway Mallampati: II  TM Distance: >3 FB Neck ROM: Full    Dental  (+) Teeth Intact   Pulmonary neg pulmonary ROS,    breath sounds clear to auscultation       Cardiovascular hypertension, + angina  Rhythm:Regular Rate:Normal     Neuro/Psych negative neurological ROS     GI/Hepatic GERD  ,  Endo/Other  Hypothyroidism   Renal/GU      Musculoskeletal   Abdominal   Peds  Hematology   Anesthesia Other Findings   Reproductive/Obstetrics                            Anesthesia Physical Anesthesia Plan  ASA: II  Anesthesia Plan: MAC and Bier Block   Post-op Pain Management:    Induction: Intravenous  Airway Management Planned:   Additional Equipment:   Intra-op Plan:   Post-operative Plan: Extubation in OR  Informed Consent: I have reviewed the patients History and Physical, chart, labs and discussed the procedure including the risks, benefits and alternatives for the proposed anesthesia with the patient or authorized representative who has indicated his/her understanding and acceptance.   Dental advisory given  Plan Discussed with: CRNA  Anesthesia Plan Comments:        Anesthesia Quick Evaluation

## 2016-04-29 NOTE — Discharge Instructions (Addendum)

## 2016-04-29 NOTE — Transfer of Care (Signed)
Immediate Anesthesia Transfer of Care Note  Patient: Amber Werner  Procedure(s) Performed: Procedure(s): Removal screws left small finger (Left)  Patient Location: PACU  Anesthesia Type:MAC and Bier block  Level of Consciousness: awake and alert   Airway & Oxygen Therapy: Patient Spontanous Breathing and Patient connected to face mask oxygen  Post-op Assessment: Report given to RN and Post -op Vital signs reviewed and stable  Post vital signs: Reviewed and stable  Last Vitals:  Vitals:   04/29/16 1420  BP: 103/65  Pulse: 71  Resp: 20  Temp: 36.9 C    Last Pain:  Vitals:   04/29/16 1420  TempSrc: Oral  PainSc: 7          Complications: No apparent anesthesia complications

## 2016-04-29 NOTE — Anesthesia Postprocedure Evaluation (Signed)
Anesthesia Post Note  Patient: Amber Werner  Procedure(s) Performed: Procedure(s) (LRB): Removal screws left small finger (Left)  Patient location during evaluation: PACU Anesthesia Type: MAC and Regional Level of consciousness: awake and alert Pain management: pain level controlled Vital Signs Assessment: post-procedure vital signs reviewed and stable Respiratory status: spontaneous breathing, nonlabored ventilation, respiratory function stable and patient connected to nasal cannula oxygen Cardiovascular status: stable and blood pressure returned to baseline Anesthetic complications: no    Last Vitals:  Vitals:   04/29/16 1609 04/29/16 1615  BP: 110/62 105/60  Pulse: 62 (!) 54  Resp: 13 12  Temp: 36.5 C     Last Pain:  Vitals:   04/29/16 1609  TempSrc:   PainSc: 0-No pain                 Samayra Hebel,JAMES TERRILL
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
## 2016-04-29 NOTE — Anesthesia Procedure Notes (Signed)
Anesthesia Regional Block:  Bier block (IV Regional)  Pre-Anesthetic Checklist: ,, timeout performed, Correct Patient, Correct Site, Correct Laterality, Correct Procedure,, site marked, surgical consent,, at surgeon's request  Laterality: Left     Needles:  Injection technique: Single-shot  Needle Type: Other      Needle Gauge: 20 and 20 G    Additional Needles: Bier block (IV Regional) Narrative:  Start time: 04/29/2016 3:22 PM End time: 04/29/2016 3:23 PM Injection made incrementally with aspirations every 35 mL.  Performed by: Personally   Additional Notes: Esmark wrap, torq up 290, neg pulse, slowly injected 35 ml pres free 0.5% lidocaine. Pt tol well, vss.

## 2016-04-30 ENCOUNTER — Encounter (HOSPITAL_BASED_OUTPATIENT_CLINIC_OR_DEPARTMENT_OTHER): Payer: Self-pay | Admitting: Orthopedic Surgery

## 2016-04-30 NOTE — Op Note (Signed)
NAMESHELLE, Amber Werner              ACCOUNT NO.:  [id number]  MEDICAL RECORD NO.:  [id number]  LOCATION:                                 FACILITY:  PHYSICIAN:  Daryll Brod, M.D.            DATE OF BIRTH:  DATE OF PROCEDURE:  04/19/2016 DATE OF DISCHARGE:                              OPERATIVE REPORT   PREOPERATIVE DIAGNOSIS:  Status post open reduction and internal fixation, left small finger proximal phalanx.  POSTOPERATIVE DIAGNOSIS:  Status post open reduction and internal fixation, left small finger proximal phalanx.  OPERATION:  Removal of screws from proximal phalanx, left small finger.  SURGEON:  Daryll Brod, M.D.  ANESTHESIA:  Forearm-based IV regional with metacarpal block and IV sedation.  ANESTHESIOLOGIST:  Ala Dach, M.D.  PLACE OF SURGERY:  [facility].  HISTORY:  The patient is a 60 year old female, who sustained a fracture to her left small finger proximal phalanx approximately 3 months ago. She underwent open reduction and internal fixation at that time.  During that procedure, she was fixated with 1.3 and 1.1 mm screws.  The shortest screws were placed after drilling holes, and these were too long going through the bone into the flexor tendon on the opposite side. It was decided to proceed along with allowing this to heal and that the drill holes had been placed originally fixated with 8 mm screws and with compression to the fracture.  These screws resulted in being too long. These were noted on image intensification during the procedure and were replaced with shorter screws available in the set.  These were 6 mm screws.  These were still slightly long and it was decided to maintain these until the fracture healed with removal once the fracture was healed to prevent damage to the flexor tendons, greater than simply placing screws if motion was allowed.  The patient was made aware of this in her first postoperative visit.  She is admitted  now for removal of the screws from a fully healed fracture.  She is aware that there is no guarantee to the surgery; the possibility of infection; recurrence of injury to arteries, nerves, tendons; incomplete relief of symptoms; dystrophy; the possibility of fracture of the phalanx due to the weakness of the bone in the short period of time for healing.  In the preoperative area, the patient is seen, the extremity marked by both patient and surgeon.  Antibiotic given.  PROCEDURE IN DETAIL:  The patient was brought to the operating room, where a forearm-based IV regional anesthetic was carried out without difficulty under the direction of Dr. Orene Desanctis.  She was prepped using ChloraPrep in a supine position with the left arm free.  After adequate anesthesia was afforded, time-out taken confirming the patient and procedure.  The old incision was used, carried down through subcutaneous tissue.  Bleeders were electrocauterized.  The interval between skin and extensor tendon was elevated.  An incision was made centrally longitudinally in the tendon where the original repair was performed. This was carried down to the proximal phalanx.  The screws were identified and confirmed in position with image intensification.  The screws were  then removed without difficulty.  The fracture was fully healed.  The wound was copiously irrigated with saline.  There was no evidence of infection.  There was no fluid around any of the screws and a bone had already begun growing over the head of each screw.  The extensor tendon was then repaired with a running 4-0 Mersilene suture. The skin was repaired after irrigation with interrupted 4-0 nylon sutures.  A sterile compressive dressing and splint to the ring and small finger hand base was applied.  On deflation of the tourniquet, all fingers immediately pinked.  A metacarpal block was given with 0.25% bupivacaine without epinephrine after the wound was closed  and prior to placement of the dressing and splint.  Approximately 8 mL was used.  The patient tolerated the procedure well.  She was taken to the recovery room for observation in satisfactory condition.  She will be discharged to home to return to [place] in 1 week, on [place].          ______________________________ Daryll Brod, M.D.     GK/MEDQ  D:  04/29/2016  T:  04/30/2016  Job:  [id number]

## 2016-05-05 DIAGNOSIS — S62617D Displaced fracture of proximal phalanx of left little finger, subsequent encounter for fracture with routine healing: Secondary | ICD-10-CM | POA: Diagnosis not present

## 2016-05-05 MED FILL — CEPHALEXIN 250 MG CAPSULE: 250 | 8 days supply | Qty: 30 | Fill #0

## 2016-05-05 MED FILL — FLUCONAZOLE 150 MG TABLET: 150 | 1 days supply | Qty: 1 | Fill #0

## 2016-05-05 MED FILL — SYNTHROID 150 MCG TABLET: 150 | 90 days supply | Qty: 90 | Fill #1

## 2016-05-05 MED FILL — FUROSEMIDE 20 MG TABLET: 20 | 90 days supply | Qty: 180 | Fill #2

## 2016-05-05 MED FILL — LOSARTAN-HCTZ 50-12.5 MG TA: 50-12.5 | 90 days supply | Qty: 90 | Fill #1

## 2016-06-18 DIAGNOSIS — G4700 Insomnia, unspecified: Secondary | ICD-10-CM | POA: Diagnosis not present

## 2016-06-18 DIAGNOSIS — Z139 Encounter for screening, unspecified: Secondary | ICD-10-CM | POA: Diagnosis not present

## 2016-06-18 DIAGNOSIS — Z6828 Body mass index (BMI) 28.0-28.9, adult: Secondary | ICD-10-CM | POA: Diagnosis not present

## 2016-06-18 DIAGNOSIS — F419 Anxiety disorder, unspecified: Secondary | ICD-10-CM | POA: Diagnosis not present

## 2016-06-18 DIAGNOSIS — I10 Essential (primary) hypertension: Secondary | ICD-10-CM | POA: Diagnosis not present

## 2016-06-18 DIAGNOSIS — J329 Chronic sinusitis, unspecified: Secondary | ICD-10-CM | POA: Diagnosis not present

## 2016-06-26 MED FILL — LIVALO 4 MG TABLET: 4 | 90 days supply | Qty: 90 | Fill #1

## 2016-07-07 ENCOUNTER — Other Ambulatory Visit: Payer: Self-pay | Admitting: Family Medicine

## 2016-07-07 DIAGNOSIS — N6002 Solitary cyst of left breast: Secondary | ICD-10-CM

## 2016-07-08 DIAGNOSIS — M542 Cervicalgia: Secondary | ICD-10-CM | POA: Diagnosis not present

## 2016-07-08 DIAGNOSIS — Z6827 Body mass index (BMI) 27.0-27.9, adult: Secondary | ICD-10-CM | POA: Diagnosis not present

## 2016-07-10 ENCOUNTER — Other Ambulatory Visit: Payer: 59

## 2016-07-11 ENCOUNTER — Ambulatory Visit
Admission: RE | Admit: 2016-07-11 | Discharge: 2016-07-11 | Disposition: A | Discharge status: Home / Self Care | Payer: 59 | Source: Ambulatory Visit | Attending: Family Medicine | Admitting: Family Medicine

## 2016-07-11 DIAGNOSIS — N644 Mastodynia: Secondary | ICD-10-CM | POA: Diagnosis not present

## 2016-07-11 DIAGNOSIS — N6002 Solitary cyst of left breast: Secondary | ICD-10-CM

## 2016-08-08 DIAGNOSIS — J329 Chronic sinusitis, unspecified: Secondary | ICD-10-CM | POA: Diagnosis not present

## 2016-08-14 MED FILL — FUROSEMIDE 20 MG TABLET: 20 | 30 days supply | Qty: 60 | Fill #3

## 2016-08-14 MED FILL — FLUCONAZOLE 150 MG TABLET: 150 | 2 days supply | Qty: 2 | Fill #0

## 2016-08-14 MED FILL — LOSARTAN-HCTZ 50-12.5 MG TA: 50-12.5 | 90 days supply | Qty: 90 | Fill #0

## 2016-08-14 MED FILL — LEVOTHYROXINE 150 MCG TAB: 150 | 90 days supply | Qty: 90 | Fill #0

## 2016-08-14 MED FILL — ESOMEPRAZOLE MAG DR 40 MG C: 40 | 90 days supply | Qty: 90 | Fill #1

## 2016-09-19 MED FILL — valACYclovir HCL 1 GM TABS: 1 | 30 days supply | Qty: 30 | Fill #0

## 2016-09-19 MED FILL — LIVALO 4 MG TABLET: 4 | 90 days supply | Qty: 90 | Fill #0

## 2016-10-13 MED FILL — ALPRAZolam 0.5 MG TABS: 0.5 | 10 days supply | Qty: 30 | Fill #0

## 2016-10-28 MED FILL — LOSARTAN-HCTZ 50-12.5 MG TA: 50-12.5 | 90 days supply | Qty: 90 | Fill #1

## 2016-10-28 MED FILL — LEVOTHYROXINE 150 MCG TAB: 150 | 90 days supply | Qty: 90 | Fill #1

## 2016-10-28 MED FILL — FUROSEMIDE 20 MG TABLET: 20 | 90 days supply | Qty: 90 | Fill #0

## 2016-11-05 MED FILL — ALPRAZolam 0.5 MG TABS: 0.5 | 10 days supply | Qty: 30 | Fill #1

## 2016-11-06 MED FILL — CELECOXIB 200 MG CAP: 200 | 90 days supply | Qty: 90 | Fill #1

## 2016-11-11 DIAGNOSIS — H16139 Photokeratitis, unspecified eye: Secondary | ICD-10-CM | POA: Diagnosis not present

## 2016-11-11 DIAGNOSIS — J019 Acute sinusitis, unspecified: Secondary | ICD-10-CM | POA: Diagnosis not present

## 2016-11-28 DIAGNOSIS — M7072 Other bursitis of hip, left hip: Secondary | ICD-10-CM | POA: Diagnosis not present

## 2016-11-28 DIAGNOSIS — E039 Hypothyroidism, unspecified: Secondary | ICD-10-CM | POA: Diagnosis not present

## 2016-11-28 DIAGNOSIS — L519 Erythema multiforme, unspecified: Secondary | ICD-10-CM | POA: Diagnosis not present

## 2016-11-28 DIAGNOSIS — I10 Essential (primary) hypertension: Secondary | ICD-10-CM | POA: Diagnosis not present

## 2016-11-28 DIAGNOSIS — E785 Hyperlipidemia, unspecified: Secondary | ICD-10-CM | POA: Diagnosis not present

## 2016-11-28 DIAGNOSIS — I519 Heart disease, unspecified: Secondary | ICD-10-CM | POA: Diagnosis not present

## 2016-11-28 DIAGNOSIS — G4700 Insomnia, unspecified: Secondary | ICD-10-CM | POA: Diagnosis not present

## 2016-11-28 DIAGNOSIS — K219 Gastro-esophageal reflux disease without esophagitis: Secondary | ICD-10-CM | POA: Diagnosis not present

## 2016-11-28 DIAGNOSIS — F419 Anxiety disorder, unspecified: Secondary | ICD-10-CM | POA: Diagnosis not present

## 2016-12-01 MED FILL — LEVOTHYROXINE 125 MCG TABLE: 125 | 90 days supply | Qty: 90 | Fill #0

## 2016-12-08 MED FILL — HYDROCODON-APAP 5-325: 5-325 | 10 days supply | Qty: 30 | Fill #0

## 2016-12-08 MED FILL — ZOLPIDEM TARTRATE 10 MG TAB: 10 | 30 days supply | Qty: 30 | Fill #0

## 2016-12-08 MED FILL — ALPRAZolam 0.5 MG TABS: 0.5 | 10 days supply | Qty: 30 | Fill #2

## 2016-12-09 ENCOUNTER — Other Ambulatory Visit: Payer: Self-pay | Admitting: Dermatology

## 2016-12-09 DIAGNOSIS — D229 Melanocytic nevi, unspecified: Secondary | ICD-10-CM | POA: Diagnosis not present

## 2016-12-09 DIAGNOSIS — L309 Dermatitis, unspecified: Secondary | ICD-10-CM | POA: Diagnosis not present

## 2016-12-09 DIAGNOSIS — L570 Actinic keratosis: Secondary | ICD-10-CM | POA: Diagnosis not present

## 2016-12-09 DIAGNOSIS — D492 Neoplasm of unspecified behavior of bone, soft tissue, and skin: Secondary | ICD-10-CM | POA: Diagnosis not present

## 2016-12-20 DIAGNOSIS — M25521 Pain in right elbow: Secondary | ICD-10-CM | POA: Diagnosis not present

## 2016-12-20 DIAGNOSIS — M25551 Pain in right hip: Secondary | ICD-10-CM | POA: Diagnosis not present

## 2016-12-20 DIAGNOSIS — M549 Dorsalgia, unspecified: Secondary | ICD-10-CM | POA: Diagnosis not present

## 2016-12-20 DIAGNOSIS — M62830 Muscle spasm of back: Secondary | ICD-10-CM | POA: Diagnosis not present

## 2017-01-07 DIAGNOSIS — J324 Chronic pansinusitis: Secondary | ICD-10-CM | POA: Diagnosis not present

## 2017-01-12 DIAGNOSIS — J324 Chronic pansinusitis: Secondary | ICD-10-CM | POA: Diagnosis not present

## 2017-01-13 MED FILL — FUROSEMIDE 20 MG TABLET: 20 | 90 days supply | Qty: 90 | Fill #1

## 2017-01-13 MED FILL — ALPRAZolam 0.5 MG TABS: 0.5 | 10 days supply | Qty: 30 | Fill #0

## 2017-01-13 MED FILL — ZOLPIDEM TARTRATE 10 MG TAB: 10 | 30 days supply | Qty: 30 | Fill #1

## 2017-01-15 MED FILL — LIVALO 4 MG TABLET: 4 | 90 days supply | Qty: 90 | Fill #1

## 2017-02-04 ENCOUNTER — Other Ambulatory Visit: Payer: Self-pay | Admitting: Dermatology

## 2017-02-04 DIAGNOSIS — D492 Neoplasm of unspecified behavior of bone, soft tissue, and skin: Secondary | ICD-10-CM | POA: Diagnosis not present

## 2017-02-04 DIAGNOSIS — L309 Dermatitis, unspecified: Secondary | ICD-10-CM | POA: Diagnosis not present

## 2017-02-04 DIAGNOSIS — L308 Other specified dermatitis: Secondary | ICD-10-CM | POA: Diagnosis not present

## 2017-02-04 DIAGNOSIS — L209 Atopic dermatitis, unspecified: Secondary | ICD-10-CM | POA: Diagnosis not present

## 2017-02-04 MED FILL — ALPRAZolam 0.5 MG TABS: 0.5 | 30 days supply | Qty: 30 | Fill #1

## 2017-02-13 DIAGNOSIS — H524 Presbyopia: Secondary | ICD-10-CM | POA: Diagnosis not present

## 2017-03-02 MED FILL — LOSARTAN-HCTZ 100-25 MG TAB: 100-25 | 90 days supply | Qty: 90 | Fill #0

## 2017-03-02 MED FILL — ZOLPIDEM TARTRATE 10 MG TAB: 10 | 30 days supply | Qty: 30 | Fill #2

## 2017-03-02 MED FILL — ESOMEPRAZOLE MAG DR 40 MG C: 40 | 90 days supply | Qty: 90 | Fill #0

## 2017-03-03 MED FILL — valACYclovir HCL 1 GM TABS: 1 | 90 days supply | Qty: 50 | Fill #0

## 2017-03-06 MED FILL — ALPRAZolam 0.5 MG TABS: 0.5 | 30 days supply | Qty: 30 | Fill #2

## 2017-03-31 DIAGNOSIS — Z6827 Body mass index (BMI) 27.0-27.9, adult: Secondary | ICD-10-CM | POA: Diagnosis not present

## 2017-03-31 DIAGNOSIS — Z01419 Encounter for gynecological examination (general) (routine) without abnormal findings: Secondary | ICD-10-CM | POA: Diagnosis not present

## 2017-03-31 DIAGNOSIS — Z1231 Encounter for screening mammogram for malignant neoplasm of breast: Secondary | ICD-10-CM | POA: Diagnosis not present

## 2017-03-31 DIAGNOSIS — Z803 Family history of malignant neoplasm of breast: Secondary | ICD-10-CM | POA: Diagnosis not present

## 2017-03-31 DIAGNOSIS — Z8052 Family history of malignant neoplasm of bladder: Secondary | ICD-10-CM | POA: Diagnosis not present

## 2017-03-31 DIAGNOSIS — Z808 Family history of malignant neoplasm of other organs or systems: Secondary | ICD-10-CM | POA: Diagnosis not present

## 2017-03-31 DIAGNOSIS — Z171 Estrogen receptor negative status [ER-]: Secondary | ICD-10-CM | POA: Diagnosis not present

## 2017-03-31 MED FILL — FLUCONAZOLE 150 MG TABLET: 150 | 1 days supply | Qty: 1 | Fill #0

## 2017-03-31 MED FILL — CEFDINIR 300 MG CAPSULE: 300 | 10 days supply | Qty: 20 | Fill #0

## 2017-03-31 MED FILL — PREMARIN VAGINAL CREAM-APPL: 0.625 | 30 days supply | Qty: 30 | Fill #0

## 2017-04-02 MED FILL — FUROSEMIDE 20 MG TABLET: 20 | 90 days supply | Qty: 90 | Fill #0

## 2017-04-03 MED FILL — ALPRAZolam 0.5 MG TABS: 0.5 | 30 days supply | Qty: 30 | Fill #3

## 2017-04-28 MED FILL — ZOLPIDEM TARTRATE 10 MG TAB: 10 | 30 days supply | Qty: 30 | Fill #3

## 2017-04-28 MED FILL — LEVOTHYROXINE 150 MCG TAB: 150 | 90 days supply | Qty: 90 | Fill #0

## 2017-05-01 MED FILL — ALPRAZolam 0.5 MG TABS: 0.5 | 30 days supply | Qty: 30 | Fill #4

## 2017-05-04 ENCOUNTER — Encounter: Payer: Self-pay | Admitting: Obstetrics and Gynecology

## 2017-05-04 DIAGNOSIS — M7060 Trochanteric bursitis, unspecified hip: Secondary | ICD-10-CM | POA: Diagnosis not present

## 2017-05-04 DIAGNOSIS — J329 Chronic sinusitis, unspecified: Secondary | ICD-10-CM | POA: Diagnosis not present

## 2017-05-06 MED FILL — VALACYCLOVIR HCL 500 MG TAB: 500 | 30 days supply | Qty: 70 | Fill #0

## 2017-05-12 MED FILL — FLUCONAZOLE 150 MG TABLET: 150 | 1 days supply | Qty: 1 | Fill #1

## 2017-06-11 MED FILL — CLINDAMYCIN HCL 300 MG CAPS: 300 | 10 days supply | Qty: 30 | Fill #0

## 2017-06-11 MED FILL — FLUCONAZOLE 150 MG TABLET: 150 | 7 days supply | Qty: 2 | Fill #0

## 2017-06-23 MED FILL — CLINDAMYCIN HCL 300 MG CAPS: 300 | 7 days supply | Qty: 21 | Fill #0

## 2017-06-23 MED FILL — ZOLPIDEM TARTRATE 10 MG TAB: 10 | 30 days supply | Qty: 30 | Fill #0

## 2017-06-23 MED FILL — CEFUROXIME AXETIL 500 MG TA: 500 | 7 days supply | Qty: 14 | Fill #0

## 2017-06-23 MED FILL — LIVALO 4 MG TABLET: 4 | 90 days supply | Qty: 90 | Fill #0

## 2017-06-26 MED FILL — FUROSEMIDE 20 MG TABS: 20 | 90 days supply | Qty: 90 | Fill #1

## 2017-07-28 DIAGNOSIS — Z09 Encounter for follow-up examination after completed treatment for conditions other than malignant neoplasm: Secondary | ICD-10-CM | POA: Diagnosis not present

## 2017-08-03 DIAGNOSIS — M25551 Pain in right hip: Secondary | ICD-10-CM | POA: Diagnosis not present

## 2017-08-03 DIAGNOSIS — M7060 Trochanteric bursitis, unspecified hip: Secondary | ICD-10-CM | POA: Diagnosis not present

## 2017-08-03 DIAGNOSIS — M25552 Pain in left hip: Secondary | ICD-10-CM | POA: Diagnosis not present

## 2017-08-03 DIAGNOSIS — R002 Palpitations: Secondary | ICD-10-CM | POA: Diagnosis not present

## 2017-08-03 DIAGNOSIS — R200 Anesthesia of skin: Secondary | ICD-10-CM | POA: Diagnosis not present

## 2017-08-03 DIAGNOSIS — F419 Anxiety disorder, unspecified: Secondary | ICD-10-CM | POA: Diagnosis not present

## 2017-08-03 DIAGNOSIS — G4700 Insomnia, unspecified: Secondary | ICD-10-CM | POA: Diagnosis not present

## 2017-08-03 DIAGNOSIS — M7062 Trochanteric bursitis, left hip: Secondary | ICD-10-CM | POA: Diagnosis not present

## 2017-08-04 MED FILL — MELOXICAM 15 MG TABLET: 15 | 90 days supply | Qty: 90 | Fill #0

## 2017-08-04 MED FILL — NITROGLYCERIN 0.4 MG TAB SL: 0.4 | 13 days supply | Qty: 25 | Fill #0

## 2017-08-11 MED FILL — ZOLPIDEM TARTRATE 10 MG TAB: 10 | 30 days supply | Qty: 30 | Fill #1

## 2017-08-11 MED FILL — LEVOTHYROXINE 150 MCG TAB: 150 | 90 days supply | Qty: 90 | Fill #1

## 2017-08-18 DIAGNOSIS — E039 Hypothyroidism, unspecified: Secondary | ICD-10-CM | POA: Diagnosis not present

## 2017-08-18 DIAGNOSIS — M25552 Pain in left hip: Secondary | ICD-10-CM | POA: Diagnosis not present

## 2017-08-26 DIAGNOSIS — M25552 Pain in left hip: Secondary | ICD-10-CM | POA: Diagnosis not present

## 2017-09-02 MED FILL — valACYclovir HCL 1 GM TABS: 1 | 90 days supply | Qty: 90 | Fill #0

## 2017-09-02 MED FILL — LOSARTAN-HCTZ 100-25 MG TAB: 100-25 | 90 days supply | Qty: 90 | Fill #1

## 2017-09-02 MED FILL — ESOMEPRAZOLE MAG DR 40 MG C: 40 | 90 days supply | Qty: 90 | Fill #1

## 2017-09-07 DIAGNOSIS — M25552 Pain in left hip: Secondary | ICD-10-CM | POA: Diagnosis not present

## 2017-09-08 MED FILL — ZOLPIDEM TARTRATE 10 MG TAB: 10 | 30 days supply | Qty: 30 | Fill #2

## 2017-09-15 DIAGNOSIS — Z419 Encounter for procedure for purposes other than remedying health state, unspecified: Secondary | ICD-10-CM | POA: Diagnosis not present

## 2017-09-15 DIAGNOSIS — L821 Other seborrheic keratosis: Secondary | ICD-10-CM | POA: Diagnosis not present

## 2017-09-15 DIAGNOSIS — L570 Actinic keratosis: Secondary | ICD-10-CM | POA: Diagnosis not present

## 2017-09-18 DIAGNOSIS — M25552 Pain in left hip: Secondary | ICD-10-CM | POA: Diagnosis not present

## 2017-09-21 MED FILL — CEPHALEXIN 500 MG CAPSULE: 500 | 7 days supply | Qty: 21 | Fill #0

## 2017-09-25 ENCOUNTER — Encounter (HOSPITAL_COMMUNITY): Payer: Self-pay | Admitting: *Deleted

## 2017-09-25 ENCOUNTER — Other Ambulatory Visit: Payer: Self-pay

## 2017-09-25 ENCOUNTER — Emergency Department (HOSPITAL_COMMUNITY): Payer: 59

## 2017-09-25 ENCOUNTER — Emergency Department (HOSPITAL_COMMUNITY)
Admission: EM | Admit: 2017-09-25 | Discharge: 2017-09-26 | Disposition: A | Discharge status: Home / Self Care | Payer: 59 | Attending: Emergency Medicine | Admitting: Emergency Medicine

## 2017-09-25 DIAGNOSIS — Y939 Activity, unspecified: Secondary | ICD-10-CM | POA: Insufficient documentation

## 2017-09-25 DIAGNOSIS — M7989 Other specified soft tissue disorders: Secondary | ICD-10-CM | POA: Diagnosis not present

## 2017-09-25 DIAGNOSIS — S86812A Strain of other muscle(s) and tendon(s) at lower leg level, left leg, initial encounter: Secondary | ICD-10-CM | POA: Diagnosis not present

## 2017-09-25 DIAGNOSIS — Z79899 Other long term (current) drug therapy: Secondary | ICD-10-CM | POA: Diagnosis not present

## 2017-09-25 DIAGNOSIS — X58XXXA Exposure to other specified factors, initial encounter: Secondary | ICD-10-CM | POA: Diagnosis not present

## 2017-09-25 DIAGNOSIS — Y929 Unspecified place or not applicable: Secondary | ICD-10-CM | POA: Diagnosis not present

## 2017-09-25 DIAGNOSIS — Y999 Unspecified external cause status: Secondary | ICD-10-CM | POA: Diagnosis not present

## 2017-09-25 DIAGNOSIS — R2242 Localized swelling, mass and lump, left lower limb: Secondary | ICD-10-CM | POA: Diagnosis not present

## 2017-09-25 DIAGNOSIS — I10 Essential (primary) hypertension: Secondary | ICD-10-CM | POA: Diagnosis not present

## 2017-09-25 DIAGNOSIS — S86912A Strain of unspecified muscle(s) and tendon(s) at lower leg level, left leg, initial encounter: Secondary | ICD-10-CM | POA: Diagnosis not present

## 2017-09-25 DIAGNOSIS — E039 Hypothyroidism, unspecified: Secondary | ICD-10-CM | POA: Insufficient documentation

## 2017-09-25 DIAGNOSIS — S86819A Strain of other muscle(s) and tendon(s) at lower leg level, unspecified leg, initial encounter: Secondary | ICD-10-CM

## 2017-09-25 DIAGNOSIS — S8992XA Unspecified injury of left lower leg, initial encounter: Secondary | ICD-10-CM | POA: Diagnosis present

## 2017-09-25 LAB — D-DIMER, QUANTITATIVE (NOT AT ARMC): D-Dimer, Quant: 0.38 ug/mL-FEU (ref 0.00–0.50)

## 2017-09-25 NOTE — ED Triage Notes (Signed)
Pt reports left calf pain. Pt states she swollen, red and warm and traveled over 8 hrs yesterday.
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
## 2017-09-25 NOTE — ED Notes (Signed)
CT called by nurse, stated that ultrasound tech is here on [facility] and will be down after finishing the current patient.

## 2017-09-26 MED ORDER — HYDROCODONE-ACETAMINOPHEN 5-325 MG PO TABS: 1.0000 | ORAL_TABLET | ORAL | 0 refills | Status: DC | PRN

## 2017-09-26 MED ORDER — IBUPROFEN 600 MG PO TABS: 600.0000 mg | ORAL_TABLET | Freq: Four times a day (QID) | ORAL | 0 refills | Status: DC | PRN

## 2017-09-29 MED FILL — LIVALO 4 MG TABLET: 4 | 90 days supply | Qty: 90 | Fill #1

## 2017-09-30 MED FILL — FLUCONAZOLE 150 MG TABS: 150 | 2 days supply | Qty: 2 | Fill #0

## 2017-10-02 DIAGNOSIS — M25552 Pain in left hip: Secondary | ICD-10-CM | POA: Diagnosis not present

## 2017-10-12 DIAGNOSIS — G4700 Insomnia, unspecified: Secondary | ICD-10-CM | POA: Diagnosis not present

## 2017-10-12 DIAGNOSIS — I10 Essential (primary) hypertension: Secondary | ICD-10-CM | POA: Diagnosis not present

## 2017-10-12 DIAGNOSIS — Z0000 Encounter for general adult medical examination without abnormal findings: Secondary | ICD-10-CM | POA: Diagnosis not present

## 2017-10-12 DIAGNOSIS — E785 Hyperlipidemia, unspecified: Secondary | ICD-10-CM | POA: Diagnosis not present

## 2017-10-12 DIAGNOSIS — E039 Hypothyroidism, unspecified: Secondary | ICD-10-CM | POA: Diagnosis not present

## 2017-10-12 DIAGNOSIS — J329 Chronic sinusitis, unspecified: Secondary | ICD-10-CM | POA: Diagnosis not present

## 2017-10-12 DIAGNOSIS — F419 Anxiety disorder, unspecified: Secondary | ICD-10-CM | POA: Diagnosis not present

## 2017-10-12 MED FILL — FUROSEMIDE 20 MG TABS: 20 | 90 days supply | Qty: 90 | Fill #0

## 2017-10-19 DIAGNOSIS — M25552 Pain in left hip: Secondary | ICD-10-CM | POA: Diagnosis not present

## 2017-10-20 MED FILL — ZOLPIDEM TARTRATE 10 MG TAB: 10 | 30 days supply | Qty: 30 | Fill #3

## 2017-11-06 MED FILL — LEVOTHYROXINE 150 MCG TAB: 150 | 90 days supply | Qty: 90 | Fill #0

## 2017-11-26 MED FILL — FLUCONAZOLE 150 MG TABS: 150 | 2 days supply | Qty: 2 | Fill #1

## 2017-11-26 MED FILL — ZOLPIDEM TARTRATE 10 MG TAB: 10 | 30 days supply | Qty: 30 | Fill #4

## 2017-11-26 MED FILL — MELOXICAM 15 MG TABLET: 15 | 90 days supply | Qty: 90 | Fill #1

## 2017-11-26 MED FILL — valACYclovir HCL 1 GM TABS: 1 | 90 days supply | Qty: 90 | Fill #1

## 2017-12-09 DIAGNOSIS — R399 Unspecified symptoms and signs involving the genitourinary system: Secondary | ICD-10-CM | POA: Diagnosis not present

## 2017-12-09 DIAGNOSIS — E039 Hypothyroidism, unspecified: Secondary | ICD-10-CM | POA: Diagnosis not present

## 2017-12-09 DIAGNOSIS — J329 Chronic sinusitis, unspecified: Secondary | ICD-10-CM | POA: Diagnosis not present

## 2017-12-09 DIAGNOSIS — R609 Edema, unspecified: Secondary | ICD-10-CM | POA: Diagnosis not present

## 2017-12-09 DIAGNOSIS — N390 Urinary tract infection, site not specified: Secondary | ICD-10-CM | POA: Diagnosis not present

## 2017-12-28 MED FILL — FUROSEMIDE 20 MG TABS: 20 | 90 days supply | Qty: 90 | Fill #1

## 2018-01-07 DIAGNOSIS — M25552 Pain in left hip: Secondary | ICD-10-CM | POA: Diagnosis not present

## 2018-01-22 DIAGNOSIS — M25552 Pain in left hip: Secondary | ICD-10-CM | POA: Diagnosis not present

## 2018-01-27 MED FILL — LIVALO 4 MG TABLET: 4 | 90 days supply | Qty: 90 | Fill #0

## 2018-01-27 MED FILL — LEVOTHYROXINE 150 MCG TAB: 150 | 90 days supply | Qty: 90 | Fill #1

## 2018-02-04 DIAGNOSIS — M25552 Pain in left hip: Secondary | ICD-10-CM | POA: Diagnosis not present

## 2018-02-17 DIAGNOSIS — M25552 Pain in left hip: Secondary | ICD-10-CM | POA: Diagnosis not present

## 2018-03-04 DIAGNOSIS — M25552 Pain in left hip: Secondary | ICD-10-CM | POA: Diagnosis not present

## 2018-03-09 DIAGNOSIS — M25552 Pain in left hip: Secondary | ICD-10-CM | POA: Diagnosis not present

## 2018-03-10 ENCOUNTER — Ambulatory Visit (INDEPENDENT_AMBULATORY_CARE_PROVIDER_SITE_OTHER): Payer: 59 | Admitting: Orthopaedic Surgery

## 2018-03-10 DIAGNOSIS — M1612 Unilateral primary osteoarthritis, left hip: Secondary | ICD-10-CM | POA: Diagnosis not present

## 2018-03-10 DIAGNOSIS — M545 Low back pain: Secondary | ICD-10-CM | POA: Diagnosis not present

## 2018-03-16 DIAGNOSIS — M25552 Pain in left hip: Secondary | ICD-10-CM | POA: Diagnosis not present

## 2018-03-26 DIAGNOSIS — M25552 Pain in left hip: Secondary | ICD-10-CM | POA: Diagnosis not present

## 2018-03-30 DIAGNOSIS — M25552 Pain in left hip: Secondary | ICD-10-CM | POA: Diagnosis not present

## 2018-04-09 MED FILL — LEVOTHYROXINE 150 MCG TAB: 150 | 90 days supply | Qty: 90 | Fill #0

## 2018-04-12 MED FILL — FUROSEMIDE 20 MG TABS: 20 | 90 days supply | Qty: 90 | Fill #0

## 2018-04-16 DIAGNOSIS — M1612 Unilateral primary osteoarthritis, left hip: Secondary | ICD-10-CM | POA: Diagnosis not present

## 2018-04-16 MED FILL — CELECOXIB 100 MG CAP: 100 | 30 days supply | Qty: 60 | Fill #0

## 2018-04-22 DIAGNOSIS — Z6826 Body mass index (BMI) 26.0-26.9, adult: Secondary | ICD-10-CM | POA: Diagnosis not present

## 2018-04-22 DIAGNOSIS — Z01419 Encounter for gynecological examination (general) (routine) without abnormal findings: Secondary | ICD-10-CM | POA: Diagnosis not present

## 2018-04-22 DIAGNOSIS — Z1231 Encounter for screening mammogram for malignant neoplasm of breast: Secondary | ICD-10-CM | POA: Diagnosis not present

## 2018-04-22 MED FILL — PREMARIN VAGINAL CREAM-APPL: 0.625 | 90 days supply | Qty: 30 | Fill #0

## 2018-04-22 MED FILL — ZOLPIDEM TARTRATE 10 MG TAB: 10 | 30 days supply | Qty: 30 | Fill #0

## 2018-04-22 MED FILL — valACYclovir HCL 1 GM TABS: 1 | 90 days supply | Qty: 90 | Fill #0

## 2018-04-23 MED FILL — LOSARTAN-HCTZ 100-25 MG TAB: 100-25 | 90 days supply | Qty: 90 | Fill #0

## 2018-05-06 MED FILL — ALPRAZolam 0.5 MG TABS: 0.5 | 10 days supply | Qty: 30 | Fill #0

## 2018-05-21 DIAGNOSIS — M1612 Unilateral primary osteoarthritis, left hip: Secondary | ICD-10-CM | POA: Diagnosis not present

## 2018-05-21 DIAGNOSIS — M542 Cervicalgia: Secondary | ICD-10-CM | POA: Diagnosis not present

## 2018-05-21 MED FILL — traMADol HCL 50 MG TABS: 50 | 15 days supply | Qty: 30 | Fill #0

## 2018-05-21 MED FILL — METHYLPREDNISOLONE 4 MG TAB: 4 | 6 days supply | Qty: 21 | Fill #0

## 2018-05-21 MED FILL — LIVALO 4 MG TABLET: 4 | 90 days supply | Qty: 90 | Fill #1

## 2018-05-26 DIAGNOSIS — F419 Anxiety disorder, unspecified: Secondary | ICD-10-CM | POA: Diagnosis not present

## 2018-05-26 DIAGNOSIS — E785 Hyperlipidemia, unspecified: Secondary | ICD-10-CM | POA: Diagnosis not present

## 2018-05-26 DIAGNOSIS — R609 Edema, unspecified: Secondary | ICD-10-CM | POA: Diagnosis not present

## 2018-05-26 DIAGNOSIS — G4700 Insomnia, unspecified: Secondary | ICD-10-CM | POA: Diagnosis not present

## 2018-05-26 DIAGNOSIS — Z0000 Encounter for general adult medical examination without abnormal findings: Secondary | ICD-10-CM | POA: Diagnosis not present

## 2018-05-26 DIAGNOSIS — I10 Essential (primary) hypertension: Secondary | ICD-10-CM | POA: Diagnosis not present

## 2018-05-26 DIAGNOSIS — E039 Hypothyroidism, unspecified: Secondary | ICD-10-CM | POA: Diagnosis not present

## 2018-06-02 DIAGNOSIS — M1612 Unilateral primary osteoarthritis, left hip: Secondary | ICD-10-CM | POA: Diagnosis not present

## 2018-06-12 DIAGNOSIS — S93601A Unspecified sprain of right foot, initial encounter: Secondary | ICD-10-CM | POA: Diagnosis not present

## 2018-06-25 DIAGNOSIS — S93601D Unspecified sprain of right foot, subsequent encounter: Secondary | ICD-10-CM | POA: Diagnosis not present

## 2018-07-12 MED FILL — FUROSEMIDE 20 MG TABS: 20 | 90 days supply | Qty: 90 | Fill #1

## 2018-07-12 MED FILL — ZOLPIDEM TARTRATE 10 MG TAB: 10 | 30 days supply | Qty: 30 | Fill #1

## 2018-07-13 MED FILL — ESOMEPRAZOLE MAG DR 40 MG C: 40 | 90 days supply | Qty: 90 | Fill #0

## 2018-07-26 MED FILL — FLUCONAZOLE 150 MG TABS: 150 | 11 days supply | Qty: 2 | Fill #0

## 2018-07-26 MED FILL — CEPHALEXIN 500 MG CAPSULE: 500 | 10 days supply | Qty: 20 | Fill #0

## 2018-07-26 MED FILL — ESOMEPRAZOLE MAG DR 40 MG C: 40 | 90 days supply | Qty: 90 | Fill #0

## 2018-07-26 MED FILL — ALPRAZolam 0.5 MG TABS: 0.5 | 10 days supply | Qty: 30 | Fill #0

## 2018-08-14 MED FILL — ZOLPIDEM TARTRATE 10 MG TAB: 10 | 30 days supply | Qty: 30 | Fill #2

## 2018-08-18 DIAGNOSIS — M25552 Pain in left hip: Secondary | ICD-10-CM | POA: Diagnosis not present

## 2018-08-18 DIAGNOSIS — M25511 Pain in right shoulder: Secondary | ICD-10-CM | POA: Diagnosis not present

## 2018-08-19 DIAGNOSIS — M1612 Unilateral primary osteoarthritis, left hip: Secondary | ICD-10-CM | POA: Diagnosis not present

## 2018-08-24 MED FILL — LIVALO 4 MG TABLET: 4 | 90 days supply | Qty: 90 | Fill #0

## 2018-08-26 ENCOUNTER — Encounter: Payer: Self-pay | Admitting: "Endocrinology

## 2018-09-02 DIAGNOSIS — M25552 Pain in left hip: Secondary | ICD-10-CM | POA: Diagnosis not present

## 2018-09-02 DIAGNOSIS — J019 Acute sinusitis, unspecified: Secondary | ICD-10-CM | POA: Diagnosis not present

## 2018-09-02 DIAGNOSIS — E039 Hypothyroidism, unspecified: Secondary | ICD-10-CM | POA: Diagnosis not present

## 2018-09-02 DIAGNOSIS — I10 Essential (primary) hypertension: Secondary | ICD-10-CM | POA: Diagnosis not present

## 2018-09-02 DIAGNOSIS — I7300 Raynaud's syndrome without gangrene: Secondary | ICD-10-CM | POA: Diagnosis not present

## 2018-09-02 DIAGNOSIS — E785 Hyperlipidemia, unspecified: Secondary | ICD-10-CM | POA: Diagnosis not present

## 2018-09-02 DIAGNOSIS — G4700 Insomnia, unspecified: Secondary | ICD-10-CM | POA: Diagnosis not present

## 2018-09-02 DIAGNOSIS — F419 Anxiety disorder, unspecified: Secondary | ICD-10-CM | POA: Diagnosis not present

## 2018-09-02 DIAGNOSIS — R1011 Right upper quadrant pain: Secondary | ICD-10-CM | POA: Diagnosis not present

## 2018-09-02 MED FILL — AMLODIPINE BESYLATE 5 MG TA: 5 | 90 days supply | Qty: 90 | Fill #0

## 2018-09-03 MED FILL — LEVOTHYROXINE 137 MCG TAB: 137 | 90 days supply | Qty: 90 | Fill #0 | Status: TO

## 2018-09-06 ENCOUNTER — Other Ambulatory Visit (HOSPITAL_COMMUNITY): Payer: Self-pay | Admitting: Family Medicine

## 2018-09-06 DIAGNOSIS — R42 Dizziness and giddiness: Secondary | ICD-10-CM

## 2018-09-06 DIAGNOSIS — R230 Cyanosis: Secondary | ICD-10-CM

## 2018-09-06 DIAGNOSIS — M542 Cervicalgia: Secondary | ICD-10-CM

## 2018-09-06 DIAGNOSIS — R079 Chest pain, unspecified: Secondary | ICD-10-CM

## 2018-09-06 DIAGNOSIS — R1011 Right upper quadrant pain: Secondary | ICD-10-CM

## 2018-09-27 DIAGNOSIS — I10 Essential (primary) hypertension: Secondary | ICD-10-CM | POA: Diagnosis not present

## 2018-09-27 DIAGNOSIS — M542 Cervicalgia: Secondary | ICD-10-CM | POA: Diagnosis not present

## 2018-09-27 DIAGNOSIS — M1612 Unilateral primary osteoarthritis, left hip: Secondary | ICD-10-CM | POA: Diagnosis not present

## 2018-09-29 ENCOUNTER — Ambulatory Visit (HOSPITAL_COMMUNITY)
Admission: RE | Admit: 2018-09-29 | Discharge: 2018-09-29 | Disposition: A | Discharge status: Home / Self Care | Payer: 59 | Source: Ambulatory Visit | Attending: Family Medicine | Admitting: Family Medicine

## 2018-09-29 ENCOUNTER — Other Ambulatory Visit: Payer: Self-pay

## 2018-09-29 DIAGNOSIS — R42 Dizziness and giddiness: Secondary | ICD-10-CM | POA: Insufficient documentation

## 2018-09-29 DIAGNOSIS — M542 Cervicalgia: Secondary | ICD-10-CM | POA: Insufficient documentation

## 2018-09-29 DIAGNOSIS — R1011 Right upper quadrant pain: Secondary | ICD-10-CM | POA: Insufficient documentation

## 2018-09-29 DIAGNOSIS — R1010 Upper abdominal pain, unspecified: Secondary | ICD-10-CM | POA: Diagnosis not present

## 2018-09-29 DIAGNOSIS — R230 Cyanosis: Secondary | ICD-10-CM | POA: Diagnosis not present

## 2018-09-29 DIAGNOSIS — R079 Chest pain, unspecified: Secondary | ICD-10-CM | POA: Insufficient documentation

## 2018-09-29 DIAGNOSIS — R51 Headache: Secondary | ICD-10-CM | POA: Diagnosis not present

## 2018-09-29 MED ORDER — IOHEXOL 300 MG/ML  SOLN
100.0000 mL | Freq: Once | INTRAMUSCULAR | Status: AC | PRN
  Administered 2018-09-29: 100 mL via INTRAVENOUS

## 2018-09-29 MED ORDER — SODIUM CHLORIDE (PF) 0.9 % IJ SOLN
INTRAMUSCULAR | Status: AC
  Filled 2018-09-29: qty 50

## 2018-09-30 MED FILL — ALPRAZolam 0.5 MG TABS: 0.5 | 10 days supply | Qty: 30 | Fill #0

## 2018-09-30 MED FILL — FUROSEMIDE 20 MG TABS: 20 | 90 days supply | Qty: 90 | Fill #0

## 2018-10-01 DIAGNOSIS — M542 Cervicalgia: Secondary | ICD-10-CM | POA: Diagnosis not present

## 2018-10-04 ENCOUNTER — Ambulatory Visit (HOSPITAL_COMMUNITY): Admission: RE | Admit: 2018-10-04 | Payer: 59 | Source: Ambulatory Visit

## 2018-10-12 DIAGNOSIS — M40202 Unspecified kyphosis, cervical region: Secondary | ICD-10-CM | POA: Diagnosis not present

## 2018-10-12 DIAGNOSIS — M542 Cervicalgia: Secondary | ICD-10-CM | POA: Diagnosis not present

## 2018-10-12 DIAGNOSIS — M4722 Other spondylosis with radiculopathy, cervical region: Secondary | ICD-10-CM | POA: Diagnosis not present

## 2018-10-15 DIAGNOSIS — M1612 Unilateral primary osteoarthritis, left hip: Secondary | ICD-10-CM | POA: Diagnosis not present

## 2018-10-20 ENCOUNTER — Other Ambulatory Visit (HOSPITAL_COMMUNITY): Payer: Self-pay | Admitting: *Deleted

## 2018-10-20 ENCOUNTER — Other Ambulatory Visit: Payer: Self-pay | Admitting: Orthopedic Surgery

## 2018-10-20 NOTE — Patient Instructions (Addendum)
Amber Werner  10/20/2018   Your procedure is scheduled on: 10-26-2018  Report to Amber Werner Main  Entrance  Report to SHORT STAY at 530 AM    GO TO  Amber 1100 AM TO 300 PM Werner [address]   Call this number if you have problems the morning of surgery [phone]     Remember: [name] AND RINSE YOUR MOUTH OUT, NO CHEWING GUM CANDY OR MINTS.   NO SOLID FOOD AFTER MIDNIGHT THE NIGHT PRIOR TO SURGERY. NOTHING BY MOUTH EXCEPT CLEAR LIQUIDS UNTIL 3 HOURS PRIOR TO [place] SURGERY. PLEASE FINISH ENSURE DRINK PER SURGEON ORDER 3 HOURS PRIOR TO SCHEDULED SURGERY TIME WHICH NEEDS TO BE COMPLETED AT 430 AM.    CLEAR LIQUID DIET   Foods Allowed                                                                     Foods Excluded  Coffee and tea, regular and decaf                             liquids that you cannot  Plain Jell-O in any flavor                                             see through such as: Fruit ices (not with fruit pulp)                                     milk, soups, orange juice  Iced Popsicles                                    All solid food Carbonated beverages, regular and diet                                    Cranberry, grape and apple juices Sports drinks like Gatorade Lightly seasoned clear broth or consume(fat free) Sugar, honey syrup  Sample Menu Breakfast                                Lunch                                     Supper Cranberry juice                    Beef broth                            Chicken broth Jell-O  Grape juice                           Apple juice Coffee or tea                        Jell-O                                      Popsicle                                                Coffee or tea                        Coffee or  tea  _____________________________________________________________________    Take these medicines the morning of surgery with A SIP OF WATER: ALPRAZOLAM (XANAX) IF NEEDED, LEVOTHYROXINE (SYNTHROID), XYZAL                               You may not have any metal on your body including hair pins and              piercings  Do not wear jewelry, make-up, lotions, powders or perfumes, deodorant             Do not wear nail polish.  Do not shave  48 hours prior to surgery.              Men may shave face and neck.   Do not bring valuables to the hospital. [name].  Contacts, dentures or bridgework may not be worn into surgery.  Leave suitcase in the car. After surgery it may be brought to your room.   _____________________________________________________________________             Amber Werner - Preparing for Surgery Before surgery, you can play an important role.  Because skin is not sterile, your skin needs to be as free of germs as possible.  You can reduce the number of germs on your skin by washing with CHG (chlorahexidine gluconate) soap before surgery.  CHG is an antiseptic cleaner which kills germs and bonds with the skin to continue killing germs even after washing. Please DO NOT use if you have an allergy to CHG or antibacterial soaps.  If your skin becomes reddened/irritated stop using the CHG and inform your nurse when you arrive at Short Stay. Do not shave (including legs and underarms) for at least 48 hours prior to the first CHG shower.  You may shave your face/neck. Please follow these instructions carefully:  1.  Shower with CHG Soap the night before surgery and the  morning of Surgery.  2.  If you choose to wash your hair, wash your hair first as usual with your  normal  shampoo.  3.  After you shampoo, rinse your hair and body thoroughly to remove the  shampoo.                           4.  Use CHG as you would any other  liquid soap.  You can apply chg directly  to the skin and wash                       Gently with a scrungie or clean washcloth.  5.  Apply the CHG Soap to your body ONLY FROM THE NECK DOWN.   Do not use on face/ open                           Wound or open sores. Avoid contact with eyes, ears mouth and genitals (private parts).                       Wash face,  Genitals (private parts) with your normal soap.             6.  Wash thoroughly, paying special attention to the area where your surgery  will be performed.  7.  Thoroughly rinse your body with warm water from the neck down.  8.  DO NOT shower/wash with your normal soap after using and rinsing off  the CHG Soap.                9.  Pat yourself dry with a clean towel.            10.  Wear clean pajamas.            11.  Place clean sheets on your bed the night of your first shower and do not  sleep with pets. Day of Surgery : Do not apply any lotions/deodorants the morning of surgery.  Please wear clean clothes to the hospital/surgery Werner.  FAILURE TO FOLLOW THESE INSTRUCTIONS MAY RESULT IN THE CANCELLATION OF YOUR SURGERY PATIENT SIGNATURE_________________________________  NURSE SIGNATURE__________________________________  ________________________________________________________________________   Amber Werner  An incentive spirometer is a tool that can help keep your lungs clear and active. This tool measures how well you are filling your lungs with each breath. Taking long deep breaths may help reverse or decrease the chance of developing breathing (pulmonary) problems (especially infection) following:  A long period of time when you are unable to move or be active. BEFORE THE PROCEDURE   If the spirometer includes an indicator to show your best effort, your nurse or respiratory therapist will set it to a desired goal.  If possible, sit up straight or lean slightly forward. Try not to slouch.  Hold the incentive  spirometer in an upright position. INSTRUCTIONS FOR USE  1. Sit on the edge of your bed if possible, or sit up as far as you can in bed or on a chair. 2. Hold the incentive spirometer in an upright position. 3. Breathe out normally. 4. Place the mouthpiece in your mouth and seal your lips tightly around it. 5. Breathe in slowly and as deeply as possible, raising the piston or the ball toward the top of the column. 6. Hold your breath for 3-5 seconds or for as long as possible. Allow the piston or ball to fall to the bottom of the column. 7. Remove the mouthpiece from your mouth and breathe out normally. 8. Rest for a few seconds and repeat Steps 1 through 7 at least 10 times every 1-2 hours when you are awake. Take your time and take a few normal breaths between deep breaths. 9. The spirometer may include an indicator to show your  best effort. Use the indicator as a goal to work toward during each repetition. 10. After each set of 10 deep breaths, practice coughing to be sure your lungs are clear. If you have an incision (the cut made at the time of surgery), support your incision when coughing by placing a pillow or rolled up towels firmly against it. Once you are able to get out of bed, walk around indoors and cough well. You may stop using the incentive spirometer when instructed by your caregiver.  RISKS AND COMPLICATIONS  Take your time so you do not get dizzy or light-headed.  If you are in pain, you may need to take or ask for pain medication before doing incentive spirometry. It is harder to take a deep breath if you are having pain. AFTER USE  Rest and breathe slowly and easily.  It can be helpful to keep track of a log of your progress. Your caregiver can provide you with a simple table to help with this. If you are using the spirometer at home, follow these instructions: [place] IF:   You are having difficultly using the spirometer.  You have trouble using the  spirometer as often as instructed.  Your pain medication is not giving enough relief while using the spirometer.  You develop fever of 100.5 F (38.1 C) or higher. SEEK IMMEDIATE MEDICAL CARE IF:   You cough up bloody sputum that had not been present before.  You develop fever of 102 F (38.9 C) or greater.  You develop worsening pain at or near the incision site. MAKE SURE YOU:   Understand these instructions.  Will watch your condition.  Will get help right away if you are not doing well or get worse. Document Released: 10/13/2006 Document Revised: 08/25/2011 Document Reviewed: 12/14/2006 [facility] Patient Information 2014 [place], [place].   ________________________________________________________________________

## 2018-10-20 NOTE — Progress Notes (Signed)
CHEST CT 09-29-18 EPIC

## 2018-10-21 ENCOUNTER — Inpatient Hospital Stay (HOSPITAL_COMMUNITY): Admission: RE | Admit: 2018-10-21 | Payer: 59 | Source: Ambulatory Visit

## 2018-10-21 ENCOUNTER — Other Ambulatory Visit: Payer: Self-pay

## 2018-10-21 ENCOUNTER — Encounter (HOSPITAL_COMMUNITY): Payer: Self-pay

## 2018-10-21 ENCOUNTER — Encounter (HOSPITAL_COMMUNITY)
Admission: RE | Admit: 2018-10-21 | Discharge: 2018-10-21 | Disposition: A | Discharge status: Home / Self Care | Payer: 59 | Source: Ambulatory Visit | Attending: Orthopedic Surgery | Admitting: Orthopedic Surgery

## 2018-10-21 ENCOUNTER — Other Ambulatory Visit (HOSPITAL_COMMUNITY)
Admission: RE | Admit: 2018-10-21 | Discharge: 2018-10-21 | Disposition: A | Discharge status: Home / Self Care | Payer: 59 | Source: Ambulatory Visit | Attending: Orthopedic Surgery | Admitting: Orthopedic Surgery

## 2018-10-21 DIAGNOSIS — Z1159 Encounter for screening for other viral diseases: Secondary | ICD-10-CM | POA: Insufficient documentation

## 2018-10-21 DIAGNOSIS — Z01812 Encounter for preprocedural laboratory examination: Secondary | ICD-10-CM | POA: Insufficient documentation

## 2018-10-21 HISTORY — DX: Chronic sinusitis, unspecified: J32.9

## 2018-10-21 LAB — CBC
HCT: 39.9 % (ref 36.0–46.0)
Hemoglobin: 12.8 g/dL (ref 12.0–15.0)
MCH: 30.3 pg (ref 26.0–34.0)
MCHC: 32.1 g/dL (ref 30.0–36.0)
MCV: 94.5 fL (ref 80.0–100.0)
Platelets: 294 10*3/uL (ref 150–400)
RBC: 4.22 MIL/uL (ref 3.87–5.11)
RDW: 13 % (ref 11.5–15.5)
WBC: 8.1 10*3/uL (ref 4.0–10.5)
nRBC: 0 % (ref 0.0–0.2)

## 2018-10-21 LAB — BASIC METABOLIC PANEL
Anion gap: 8 (ref 5–15)
BUN: 16 mg/dL (ref 6–20)
CO2: 26 mmol/L (ref 22–32)
Calcium: 9.2 mg/dL (ref 8.9–10.3)
Chloride: 105 mmol/L (ref 98–111)
Creatinine, Ser: 0.83 mg/dL (ref 0.44–1.00)
GFR calc Af Amer: >60 mL/min (ref >60)
GFR calc non Af Amer: >60 mL/min (ref >60)
Glucose, Bld: 87 mg/dL (ref 70–99)
Potassium: 3.8 mmol/L (ref 3.5–5.1)
Sodium: 139 mmol/L (ref 135–145)

## 2018-10-21 LAB — SURGICAL PCR SCREEN
MRSA, PCR: NEGATIVE
Staphylococcus aureus: NEGATIVE

## 2018-10-21 NOTE — Progress Notes (Signed)
ekg 05-26-2018 dr Eddie Dibbles settle on chart

## 2018-10-21 NOTE — Anesthesia Preprocedure Evaluation (Addendum)
Anesthesia Evaluation  Patient identified by MRN, date of birth, ID band Patient awake    Reviewed: Allergy & Precautions, NPO status , Patient's Chart, lab work & pertinent test results  Airway Mallampati: II  TM Distance: >3 FB Neck ROM: Full    Dental no notable dental hx.    Pulmonary neg pulmonary ROS,    Pulmonary exam normal breath sounds clear to auscultation       Cardiovascular hypertension, Pt. on medications Normal cardiovascular exam Rhythm:Regular Rate:Normal     Neuro/Psych Anxiety negative neurological ROS     GI/Hepatic negative GI ROS, Neg liver ROS,   Endo/Other  negative endocrine ROS  Renal/GU negative Renal ROS  negative genitourinary   Musculoskeletal negative musculoskeletal ROS (+)   Abdominal   Peds negative pediatric ROS (+)  Hematology negative hematology ROS (+)   Anesthesia Other Findings   Reproductive/Obstetrics negative OB ROS                            Anesthesia Physical Anesthesia Plan  ASA: II  Anesthesia Plan: Spinal   Post-op Pain Management:    Induction:   PONV Risk Score and Plan: 2 and Ondansetron and Treatment may vary due to age or medical condition  Airway Management Planned: Simple Face Mask  Additional Equipment:   Intra-op Plan:   Post-operative Plan:   Informed Consent: I have reviewed the patients History and Physical, chart, labs and discussed the procedure including the risks, benefits and alternatives for the proposed anesthesia with the patient or authorized representative who has indicated his/her understanding and acceptance.     Dental advisory given  Plan Discussed with: CRNA  Anesthesia Plan Comments: (See PAT note 10/21/18, Konrad Felix, PA-C)       Anesthesia Quick Evaluation
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
## 2018-10-22 LAB — NOVEL CORONAVIRUS, NAA (HOSP ORDER, SEND-OUT TO REF LAB; TAT 18-24 HRS): SARS-CoV-2, NAA: NOT DETECTED

## 2018-10-25 ENCOUNTER — Encounter: Payer: Self-pay | Admitting: *Deleted

## 2018-10-25 ENCOUNTER — Other Ambulatory Visit: Payer: Self-pay | Admitting: *Deleted

## 2018-10-25 NOTE — Patient Outreach (Signed)
[name]) Care Management  10/25/2018  Amber Werner [birth year] [id number]  Preoperative Screening Call Referral received: 10/25/18 Surgery/procedure date: 10/26/18 Insurance: [facility] Choice Plan  Subjective:  Initial successful telephone call to patient's preferred number in order to complete preoperative screening. 2 HIPAA identifiers verified. Discussed purpose of preoperative call. Patient voices understanding and agrees to call. She states she understands the reasons for the surgery and the expected time of arrival. She says she completed her pre-op testing on 10/21/18 and has no additional questions. She says she expects to be in the hospital 2-3 days. She states she has completed medical leave paperwork and has the hospital indemnity benefit and is aware she will have to file the claim after her surgery.She says she uses a Armed forces operational officer. She says she will have 24/7 care at home to assist in her recovery provided by her husband and a good friend who will stay with her for a week. She has an advanced directive. She agrees to a post hospital discharge transition of care call.    Objective:  Per chart review, Amber Werner is scheduled for left hip arthroplasty on 10/26/18 at [facility].   Assessment: Preoperative call completed, no preoperative needs identified.  Provided Amber Werner with directions to [facility] long OP pharmacy.   Plan: RNCM will call patient for transition of care outreach within 72 hours of hospital discharge notification.  Barrington Ellison RN,CCM,CDE [place] Management Coordinator Office Phone [phone] Office Fax [phone]

## 2018-10-25 NOTE — Progress Notes (Signed)
SPOKE W/  _patient     SCREENING SYMPTOMS OF COVID 19:   COUGH--no  RUNNY NOSE--- no  SORE THROAT---no  NASAL CONGESTION----no  SNEEZING----no  SHORTNESS OF BREATH---no  DIFFICULTY BREATHING---no  TEMP >100.0 -----no  UNEXPLAINED BODY ACHES------no  CHILLS --------no   HEADACHES ---------no  LOSS OF SMELL/ TASTE --------no    HAVE YOU OR ANY FAMILY MEMBER TRAVELLED PAST 14 DAYS OUT OF THE   COUNTY---no STATE----no COUNTRY----no  HAVE YOU OR ANY FAMILY MEMBER BEEN EXPOSED TO ANYONE WITH COVID 19? no    

## 2018-10-26 ENCOUNTER — Inpatient Hospital Stay (HOSPITAL_COMMUNITY): Payer: 59

## 2018-10-26 ENCOUNTER — Inpatient Hospital Stay (HOSPITAL_COMMUNITY): Payer: 59 | Admitting: Physician Assistant

## 2018-10-26 ENCOUNTER — Inpatient Hospital Stay (HOSPITAL_COMMUNITY)
Admission: RE | Admit: 2018-10-26 | Discharge: 2018-10-27 | DRG: 470 | Disposition: A | Discharge status: Home Health | Payer: 59 | Attending: Orthopedic Surgery | Admitting: Orthopedic Surgery

## 2018-10-26 ENCOUNTER — Encounter (HOSPITAL_COMMUNITY)
Admission: RE | Disposition: A | Discharge status: Home Health | Payer: Self-pay | Source: Home / Self Care | Attending: Orthopedic Surgery

## 2018-10-26 ENCOUNTER — Encounter (HOSPITAL_COMMUNITY): Payer: Self-pay | Admitting: Emergency Medicine

## 2018-10-26 ENCOUNTER — Inpatient Hospital Stay (HOSPITAL_COMMUNITY): Payer: 59 | Admitting: Anesthesiology

## 2018-10-26 ENCOUNTER — Other Ambulatory Visit: Payer: Self-pay

## 2018-10-26 DIAGNOSIS — Z791 Long term (current) use of non-steroidal anti-inflammatories (NSAID): Secondary | ICD-10-CM | POA: Diagnosis not present

## 2018-10-26 DIAGNOSIS — Z96649 Presence of unspecified artificial hip joint: Secondary | ICD-10-CM

## 2018-10-26 DIAGNOSIS — M1612 Unilateral primary osteoarthritis, left hip: Principal | ICD-10-CM

## 2018-10-26 DIAGNOSIS — K219 Gastro-esophageal reflux disease without esophagitis: Secondary | ICD-10-CM | POA: Diagnosis present

## 2018-10-26 DIAGNOSIS — F419 Anxiety disorder, unspecified: Secondary | ICD-10-CM | POA: Diagnosis present

## 2018-10-26 DIAGNOSIS — Z96642 Presence of left artificial hip joint: Secondary | ICD-10-CM | POA: Diagnosis not present

## 2018-10-26 DIAGNOSIS — Z79899 Other long term (current) drug therapy: Secondary | ICD-10-CM | POA: Diagnosis not present

## 2018-10-26 DIAGNOSIS — E785 Hyperlipidemia, unspecified: Secondary | ICD-10-CM | POA: Diagnosis present

## 2018-10-26 DIAGNOSIS — Z981 Arthrodesis status: Secondary | ICD-10-CM | POA: Diagnosis not present

## 2018-10-26 DIAGNOSIS — I10 Essential (primary) hypertension: Secondary | ICD-10-CM | POA: Diagnosis present

## 2018-10-26 DIAGNOSIS — Z79890 Hormone replacement therapy (postmenopausal): Secondary | ICD-10-CM

## 2018-10-26 DIAGNOSIS — Z96641 Presence of right artificial hip joint: Secondary | ICD-10-CM

## 2018-10-26 DIAGNOSIS — Z8614 Personal history of Methicillin resistant Staphylococcus aureus infection: Secondary | ICD-10-CM

## 2018-10-26 DIAGNOSIS — Z882 Allergy status to sulfonamides status: Secondary | ICD-10-CM | POA: Diagnosis not present

## 2018-10-26 DIAGNOSIS — M25552 Pain in left hip: Secondary | ICD-10-CM | POA: Diagnosis present

## 2018-10-26 DIAGNOSIS — Z880 Allergy status to penicillin: Secondary | ICD-10-CM

## 2018-10-26 DIAGNOSIS — E039 Hypothyroidism, unspecified: Secondary | ICD-10-CM | POA: Diagnosis present

## 2018-10-26 DIAGNOSIS — G4700 Insomnia, unspecified: Secondary | ICD-10-CM | POA: Diagnosis not present

## 2018-10-26 DIAGNOSIS — Z471 Aftercare following joint replacement surgery: Secondary | ICD-10-CM | POA: Diagnosis not present

## 2018-10-26 DIAGNOSIS — Z881 Allergy status to other antibiotic agents status: Secondary | ICD-10-CM

## 2018-10-26 HISTORY — PX: TOTAL HIP ARTHROPLASTY: SHX124

## 2018-10-26 HISTORY — DX: Unilateral primary osteoarthritis, left hip: M16.12

## 2018-10-26 SURGERY — ARTHROPLASTY, HIP, TOTAL,POSTERIOR APPROACH
Anesthesia: Spinal | Site: Hip | Laterality: Left

## 2018-10-26 MED ORDER — ADULT MULTIVITAMIN W/MINERALS CH
1.0000 | ORAL_TABLET | Freq: Every day | ORAL | Status: DC
  Administered 2018-10-27: 1 via ORAL
  Filled 2018-10-26: qty 1

## 2018-10-26 MED ORDER — LORATADINE 10 MG PO TABS
10.0000 mg | ORAL_TABLET | Freq: Every day | ORAL | Status: DC
  Filled 2018-10-26: qty 1

## 2018-10-26 MED ORDER — ACETAMINOPHEN 325 MG PO TABS: 325.0000 mg | ORAL_TABLET | Freq: Four times a day (QID) | ORAL | Status: DC | PRN

## 2018-10-26 MED ORDER — PHENYLEPHRINE 40 MCG/ML (10ML) SYRINGE FOR IV PUSH (FOR BLOOD PRESSURE SUPPORT)
PREFILLED_SYRINGE | INTRAVENOUS | Status: AC
  Filled 2018-10-26: qty 10

## 2018-10-26 MED ORDER — MORPHINE SULFATE (PF) 2 MG/ML IV SOLN
0.5000 mg | INTRAVENOUS | Status: DC | PRN
  Administered 2018-10-26: 1 mg via INTRAVENOUS
  Filled 2018-10-26: qty 1

## 2018-10-26 MED ORDER — VANCOMYCIN HCL 1000 MG IV SOLR
1000.0000 mg | Freq: Two times a day (BID) | INTRAVENOUS | Status: AC
  Administered 2018-10-26: 1000 mg via INTRAVENOUS
  Filled 2018-10-26 (×2): qty 1000

## 2018-10-26 MED ORDER — ONE-DAILY MULTI VITAMINS PO TABS: 1.0000 | ORAL_TABLET | Freq: Every day | ORAL | Status: DC

## 2018-10-26 MED ORDER — ONDANSETRON HCL 4 MG/2ML IJ SOLN
INTRAMUSCULAR | Status: DC | PRN
  Administered 2018-10-26: 4 mg via INTRAVENOUS

## 2018-10-26 MED ORDER — MAGNESIUM OXIDE 400 (241.3 MG) MG PO TABS
400.0000 mg | ORAL_TABLET | Freq: Every day | ORAL | Status: DC
  Administered 2018-10-26: 400 mg via ORAL
  Filled 2018-10-26: qty 1

## 2018-10-26 MED ORDER — HYDROCODONE-ACETAMINOPHEN 7.5-325 MG PO TABS
1.0000 | ORAL_TABLET | ORAL | Status: DC | PRN
  Administered 2018-10-26 – 2018-10-27 (×3): 2 via ORAL
  Filled 2018-10-26 (×3): qty 2

## 2018-10-26 MED ORDER — PROPOFOL 500 MG/50ML IV EMUL
INTRAVENOUS | Status: DC | PRN
  Administered 2018-10-26: 75 ug/kg/min via INTRAVENOUS

## 2018-10-26 MED ORDER — HYDROMORPHONE HCL 1 MG/ML IJ SOLN
INTRAMUSCULAR | Status: AC
  Filled 2018-10-26: qty 2

## 2018-10-26 MED ORDER — FENTANYL CITRATE (PF) 100 MCG/2ML IJ SOLN
INTRAMUSCULAR | Status: AC
  Filled 2018-10-26: qty 2

## 2018-10-26 MED ORDER — HYDROMORPHONE HCL 1 MG/ML IJ SOLN
0.2500 mg | INTRAMUSCULAR | Status: DC | PRN
  Administered 2018-10-26 (×2): 0.5 mg via INTRAVENOUS

## 2018-10-26 MED ORDER — MAGNESIUM 400 MG PO TABS: ORAL_TABLET | Freq: Every day | ORAL | Status: DC

## 2018-10-26 MED ORDER — FENTANYL CITRATE (PF) 100 MCG/2ML IJ SOLN
INTRAMUSCULAR | Status: DC | PRN
  Administered 2018-10-26: 100 ug via INTRAVENOUS

## 2018-10-26 MED ORDER — TRANEXAMIC ACID-NACL 1000-0.7 MG/100ML-% IV SOLN
1000.0000 mg | INTRAVENOUS | Status: AC
  Administered 2018-10-26: 1000 mg via INTRAVENOUS
  Filled 2018-10-26: qty 100

## 2018-10-26 MED ORDER — LACTATED RINGERS IV SOLN
INTRAVENOUS | Status: DC
  Administered 2018-10-26 (×2): via INTRAVENOUS

## 2018-10-26 MED ORDER — ASPIRIN EC 325 MG PO TBEC: 325.0000 mg | DELAYED_RELEASE_TABLET | Freq: Two times a day (BID) | ORAL | 0 refills | Status: DC

## 2018-10-26 MED ORDER — ONDANSETRON HCL 4 MG PO TABS: 4.0000 mg | ORAL_TABLET | Freq: Three times a day (TID) | ORAL | 0 refills | Status: DC | PRN

## 2018-10-26 MED ORDER — METHOCARBAMOL 500 MG PO TABS
500.0000 mg | ORAL_TABLET | Freq: Four times a day (QID) | ORAL | Status: DC | PRN
  Administered 2018-10-27 (×2): 500 mg via ORAL
  Filled 2018-10-26 (×2): qty 1

## 2018-10-26 MED ORDER — DEXAMETHASONE SODIUM PHOSPHATE 10 MG/ML IJ SOLN
INTRAMUSCULAR | Status: DC | PRN
  Administered 2018-10-26: 10 mg via INTRAVENOUS

## 2018-10-26 MED ORDER — ACETAMINOPHEN 500 MG PO TABS
500.0000 mg | ORAL_TABLET | Freq: Four times a day (QID) | ORAL | Status: AC
  Administered 2018-10-26 – 2018-10-27 (×4): 500 mg via ORAL
  Filled 2018-10-26 (×4): qty 1

## 2018-10-26 MED ORDER — METOCLOPRAMIDE HCL 5 MG/ML IJ SOLN: 10.0000 mg | Freq: Once | INTRAMUSCULAR | Status: DC | PRN

## 2018-10-26 MED ORDER — HYDROCHLOROTHIAZIDE 12.5 MG PO CAPS
12.5000 mg | ORAL_CAPSULE | Freq: Every day | ORAL | Status: DC
  Administered 2018-10-26 – 2018-10-27 (×2): 12.5 mg via ORAL
  Filled 2018-10-26 (×2): qty 1

## 2018-10-26 MED ORDER — STERILE WATER FOR IRRIGATION IR SOLN
Status: DC | PRN
  Administered 2018-10-26: 2000 mL

## 2018-10-26 MED ORDER — FUROSEMIDE 20 MG PO TABS: 20.0000 mg | ORAL_TABLET | Freq: Every day | ORAL | Status: DC | PRN

## 2018-10-26 MED ORDER — MENTHOL 3 MG MT LOZG: 1.0000 | LOZENGE | OROMUCOSAL | Status: DC | PRN

## 2018-10-26 MED ORDER — LEVOCETIRIZINE DIHYDROCHLORIDE 5 MG PO TABS: 5.0000 mg | ORAL_TABLET | Freq: Every day | ORAL | Status: DC

## 2018-10-26 MED ORDER — ESTROGENS, CONJUGATED 0.625 MG/GM VA CREA
1.0000 | TOPICAL_CREAM | VAGINAL | Status: DC
  Filled 2018-10-26: qty 30

## 2018-10-26 MED ORDER — HYDROCODONE-ACETAMINOPHEN 10-325 MG PO TABS: 1.0000 | ORAL_TABLET | Freq: Four times a day (QID) | ORAL | 0 refills | Status: DC | PRN

## 2018-10-26 MED ORDER — DEXAMETHASONE SODIUM PHOSPHATE 10 MG/ML IJ SOLN
10.0000 mg | Freq: Once | INTRAMUSCULAR | Status: AC
  Administered 2018-10-27: 10 mg via INTRAVENOUS
  Filled 2018-10-26: qty 1

## 2018-10-26 MED ORDER — MIDAZOLAM HCL 5 MG/5ML IJ SOLN
INTRAMUSCULAR | Status: DC | PRN
  Administered 2018-10-26: 2 mg via INTRAVENOUS

## 2018-10-26 MED ORDER — ALPRAZOLAM 0.5 MG PO TABS: 0.5000 mg | ORAL_TABLET | Freq: Every day | ORAL | Status: DC | PRN

## 2018-10-26 MED ORDER — EPHEDRINE SULFATE-NACL 50-0.9 MG/10ML-% IV SOSY
PREFILLED_SYRINGE | INTRAVENOUS | Status: DC | PRN
  Administered 2018-10-26 (×2): 10 mg via INTRAVENOUS
  Administered 2018-10-26 (×2): 5 mg via INTRAVENOUS

## 2018-10-26 MED ORDER — BACLOFEN 10 MG PO TABS: 10.0000 mg | ORAL_TABLET | Freq: Three times a day (TID) | ORAL | 0 refills | Status: DC

## 2018-10-26 MED ORDER — PHENOL 1.4 % MT LIQD
1.0000 | OROMUCOSAL | Status: DC | PRN
  Filled 2018-10-26: qty 177

## 2018-10-26 MED ORDER — LEVOFLOXACIN 500 MG PO TABS: 500.0000 mg | ORAL_TABLET | Freq: Every day | ORAL | Status: DC

## 2018-10-26 MED ORDER — NITROGLYCERIN 0.4 MG SL SUBL: 0.4000 mg | SUBLINGUAL_TABLET | SUBLINGUAL | Status: DC | PRN

## 2018-10-26 MED ORDER — TRANEXAMIC ACID-NACL 1000-0.7 MG/100ML-% IV SOLN
1000.0000 mg | Freq: Once | INTRAVENOUS | Status: AC
  Administered 2018-10-26: 1000 mg via INTRAVENOUS
  Filled 2018-10-26: qty 100

## 2018-10-26 MED ORDER — DIPHENHYDRAMINE HCL 12.5 MG/5ML PO ELIX: 12.5000 mg | ORAL_SOLUTION | ORAL | Status: DC | PRN

## 2018-10-26 MED ORDER — MIDAZOLAM HCL 2 MG/2ML IJ SOLN
INTRAMUSCULAR | Status: AC
  Filled 2018-10-26: qty 2

## 2018-10-26 MED ORDER — FLUTICASONE PROPIONATE 50 MCG/ACT NA SUSP
1.0000 | Freq: Every evening | NASAL | Status: DC | PRN
  Filled 2018-10-26: qty 16

## 2018-10-26 MED ORDER — ALUM & MAG HYDROXIDE-SIMETH 200-200-20 MG/5ML PO SUSP: 30.0000 mL | ORAL | Status: DC | PRN

## 2018-10-26 MED ORDER — METOCLOPRAMIDE HCL 5 MG PO TABS: 5.0000 mg | ORAL_TABLET | Freq: Three times a day (TID) | ORAL | Status: DC | PRN

## 2018-10-26 MED ORDER — BUPIVACAINE HCL (PF) 0.25 % IJ SOLN
INTRAMUSCULAR | Status: AC
  Filled 2018-10-26: qty 30

## 2018-10-26 MED ORDER — LEVOTHYROXINE SODIUM 25 MCG PO TABS
137.0000 ug | ORAL_TABLET | Freq: Every day | ORAL | Status: DC
  Administered 2018-10-27: 137 ug via ORAL
  Filled 2018-10-26 (×2): qty 1

## 2018-10-26 MED ORDER — HYDROCODONE-ACETAMINOPHEN 5-325 MG PO TABS
1.0000 | ORAL_TABLET | ORAL | Status: DC | PRN
  Administered 2018-10-26: 1 via ORAL
  Filled 2018-10-26: qty 1
  Filled 2018-10-26: qty 2

## 2018-10-26 MED ORDER — POLYETHYLENE GLYCOL 3350 17 G PO PACK: 17.0000 g | PACK | Freq: Every day | ORAL | Status: DC | PRN

## 2018-10-26 MED ORDER — POVIDONE-IODINE 10 % EX SWAB
2.0000 "application " | Freq: Once | CUTANEOUS | Status: AC
  Administered 2018-10-26: 2 via TOPICAL

## 2018-10-26 MED ORDER — VANCOMYCIN HCL IN DEXTROSE 1-5 GM/200ML-% IV SOLN
1000.0000 mg | Freq: Two times a day (BID) | INTRAVENOUS | Status: DC
  Filled 2018-10-26: qty 200

## 2018-10-26 MED ORDER — MAGNESIUM CITRATE PO SOLN: 1.0000 | Freq: Once | ORAL | Status: DC | PRN

## 2018-10-26 MED ORDER — CHLORHEXIDINE GLUCONATE 4 % EX LIQD: 60.0000 mL | Freq: Once | CUTANEOUS | Status: DC

## 2018-10-26 MED ORDER — PROPOFOL 10 MG/ML IV BOLUS
INTRAVENOUS | Status: DC | PRN
  Administered 2018-10-26 (×2): 10 mg via INTRAVENOUS
  Administered 2018-10-26: 20 mg via INTRAVENOUS

## 2018-10-26 MED ORDER — EPHEDRINE 5 MG/ML INJ
INTRAVENOUS | Status: AC
  Filled 2018-10-26: qty 10

## 2018-10-26 MED ORDER — SENNA-DOCUSATE SODIUM 8.6-50 MG PO TABS: 2.0000 | ORAL_TABLET | Freq: Every day | ORAL | 1 refills | Status: DC

## 2018-10-26 MED ORDER — BUPIVACAINE HCL (PF) 0.25 % IJ SOLN
INTRAMUSCULAR | Status: DC | PRN
  Administered 2018-10-26: 30 mL

## 2018-10-26 MED ORDER — KETOROLAC TROMETHAMINE 15 MG/ML IJ SOLN
7.5000 mg | Freq: Four times a day (QID) | INTRAMUSCULAR | Status: AC
  Administered 2018-10-26 – 2018-10-27 (×4): 7.5 mg via INTRAVENOUS
  Filled 2018-10-26 (×4): qty 1

## 2018-10-26 MED ORDER — BUPIVACAINE IN DEXTROSE 0.75-8.25 % IT SOLN
INTRATHECAL | Status: DC | PRN
  Administered 2018-10-26: 1.6 mL via INTRATHECAL

## 2018-10-26 MED ORDER — LOSARTAN POTASSIUM 50 MG PO TABS
50.0000 mg | ORAL_TABLET | Freq: Every day | ORAL | Status: DC
  Administered 2018-10-26 – 2018-10-27 (×2): 50 mg via ORAL
  Filled 2018-10-26 (×2): qty 1

## 2018-10-26 MED ORDER — ZOLPIDEM TARTRATE 10 MG PO TABS
5.0000 mg | ORAL_TABLET | Freq: Every day | ORAL | Status: DC
  Administered 2018-10-26: 5 mg via ORAL
  Filled 2018-10-26: qty 1

## 2018-10-26 MED ORDER — LOSARTAN POTASSIUM-HCTZ 50-12.5 MG PO TABS: 1.0000 | ORAL_TABLET | Freq: Every day | ORAL | Status: DC

## 2018-10-26 MED ORDER — ONDANSETRON HCL 4 MG/2ML IJ SOLN: 4.0000 mg | Freq: Four times a day (QID) | INTRAMUSCULAR | Status: DC | PRN

## 2018-10-26 MED ORDER — ASPIRIN EC 325 MG PO TBEC
325.0000 mg | DELAYED_RELEASE_TABLET | Freq: Two times a day (BID) | ORAL | Status: DC
  Administered 2018-10-27: 325 mg via ORAL
  Filled 2018-10-26: qty 1

## 2018-10-26 MED ORDER — KETOROLAC TROMETHAMINE 30 MG/ML IJ SOLN
INTRAMUSCULAR | Status: DC | PRN
  Administered 2018-10-26: 30 mg via INTRAMUSCULAR

## 2018-10-26 MED ORDER — MEPERIDINE HCL 50 MG/ML IJ SOLN: 6.2500 mg | INTRAMUSCULAR | Status: DC | PRN

## 2018-10-26 MED ORDER — PHENYLEPHRINE 40 MCG/ML (10ML) SYRINGE FOR IV PUSH (FOR BLOOD PRESSURE SUPPORT)
PREFILLED_SYRINGE | INTRAVENOUS | Status: DC | PRN
  Administered 2018-10-26 (×7): 80 ug via INTRAVENOUS

## 2018-10-26 MED ORDER — VALACYCLOVIR HCL 500 MG PO TABS
500.0000 mg | ORAL_TABLET | Freq: Every day | ORAL | Status: DC
  Administered 2018-10-26 – 2018-10-27 (×2): 500 mg via ORAL
  Filled 2018-10-26 (×2): qty 1

## 2018-10-26 MED ORDER — BUPIVACAINE-EPINEPHRINE (PF) 0.25% -1:200000 IJ SOLN
INTRAMUSCULAR | Status: AC
  Filled 2018-10-26: qty 30

## 2018-10-26 MED ORDER — KETOROLAC TROMETHAMINE 30 MG/ML IJ SOLN
INTRAMUSCULAR | Status: AC
  Filled 2018-10-26: qty 1

## 2018-10-26 MED ORDER — 0.9 % SODIUM CHLORIDE (POUR BTL) OPTIME
TOPICAL | Status: DC | PRN
  Administered 2018-10-26: 1000 mL

## 2018-10-26 MED ORDER — PROPOFOL 10 MG/ML IV BOLUS
INTRAVENOUS | Status: AC
  Filled 2018-10-26: qty 60

## 2018-10-26 MED ORDER — LIDOCAINE HCL (CARDIAC) PF 100 MG/5ML IV SOSY
PREFILLED_SYRINGE | INTRAVENOUS | Status: DC | PRN
  Administered 2018-10-26: 80 mg via INTRAVENOUS

## 2018-10-26 MED ORDER — VANCOMYCIN HCL IN DEXTROSE 1-5 GM/200ML-% IV SOLN
1000.0000 mg | INTRAVENOUS | Status: AC
  Administered 2018-10-26: 1000 mg via INTRAVENOUS
  Filled 2018-10-26: qty 200

## 2018-10-26 MED ORDER — BISACODYL 10 MG RE SUPP: 10.0000 mg | Freq: Every day | RECTAL | Status: DC | PRN

## 2018-10-26 MED ORDER — PRAVASTATIN SODIUM 20 MG PO TABS
40.0000 mg | ORAL_TABLET | Freq: Every day | ORAL | Status: DC
  Administered 2018-10-26: 40 mg via ORAL
  Filled 2018-10-26: qty 2

## 2018-10-26 MED ORDER — METHOCARBAMOL 500 MG IVPB - SIMPLE MED
INTRAVENOUS | Status: AC
  Filled 2018-10-26: qty 50

## 2018-10-26 MED ORDER — VITAMIN D 25 MCG (1000 UNIT) PO TABS
1000.0000 [IU] | ORAL_TABLET | Freq: Every day | ORAL | Status: DC
  Administered 2018-10-26 – 2018-10-27 (×2): 1000 [IU] via ORAL
  Filled 2018-10-26 (×2): qty 1

## 2018-10-26 MED ORDER — LACTATED RINGERS IV SOLN: INTRAVENOUS | Status: DC

## 2018-10-26 MED ORDER — FENTANYL CITRATE (PF) 100 MCG/2ML IJ SOLN
25.0000 ug | INTRAMUSCULAR | Status: DC | PRN
  Administered 2018-10-26 (×2): 50 ug via INTRAVENOUS

## 2018-10-26 MED ORDER — DOCUSATE SODIUM 100 MG PO CAPS
100.0000 mg | ORAL_CAPSULE | Freq: Two times a day (BID) | ORAL | Status: DC
  Administered 2018-10-26 – 2018-10-27 (×2): 100 mg via ORAL
  Filled 2018-10-26 (×2): qty 1

## 2018-10-26 MED ORDER — POTASSIUM CHLORIDE IN NACL 20-0.45 MEQ/L-% IV SOLN
INTRAVENOUS | Status: DC
  Administered 2018-10-26 – 2018-10-27 (×2): via INTRAVENOUS
  Filled 2018-10-26 (×2): qty 1000

## 2018-10-26 MED ORDER — METHOCARBAMOL 500 MG IVPB - SIMPLE MED
500.0000 mg | Freq: Four times a day (QID) | INTRAVENOUS | Status: DC | PRN
  Administered 2018-10-26: 500 mg via INTRAVENOUS
  Filled 2018-10-26: qty 50

## 2018-10-26 MED ORDER — MORPHINE SULFATE (PF) 2 MG/ML IV SOLN
1.0000 mg | INTRAVENOUS | Status: DC | PRN
  Administered 2018-10-26: 1 mg via INTRAVENOUS
  Administered 2018-10-26 – 2018-10-27 (×2): 2 mg via INTRAVENOUS
  Filled 2018-10-26 (×3): qty 1

## 2018-10-26 MED ORDER — METOCLOPRAMIDE HCL 5 MG/ML IJ SOLN: 5.0000 mg | Freq: Three times a day (TID) | INTRAMUSCULAR | Status: DC | PRN

## 2018-10-26 MED ORDER — ONDANSETRON HCL 4 MG PO TABS: 4.0000 mg | ORAL_TABLET | Freq: Four times a day (QID) | ORAL | Status: DC | PRN

## 2018-10-26 SURGICAL SUPPLY — 59 items
APL PRP STRL LF DISP 70% ISPRP (MISCELLANEOUS) ×2
BIT DRILL 2.0X128 (BIT) ×2 IMPLANT
BLADE SAW SGTL 73X25 THK (BLADE) ×2 IMPLANT
BLADE SURG SZ10 CARB STEEL (BLADE) ×5 IMPLANT
CHLORAPREP W/TINT 26 (MISCELLANEOUS) ×4 IMPLANT
CLSR STERI-STRIP ANTIMIC 1/2X4 (GAUZE/BANDAGES/DRESSINGS) ×3 IMPLANT
COVER SURGICAL LIGHT HANDLE (MISCELLANEOUS) ×2 IMPLANT
COVER WAND RF STERILE (DRAPES) IMPLANT
DRAPE INCISE IOBAN 66X45 STRL (DRAPES) ×4 IMPLANT
DRAPE ORTHO SPLIT 77X108 STRL (DRAPES) ×4
DRAPE POUCH INSTRU U-SHP 10X18 (DRAPES) ×2 IMPLANT
DRAPE SHEET LG 3/4 BI-LAMINATE (DRAPES) ×2 IMPLANT
DRAPE SURG 17X11 SM STRL (DRAPES) ×2 IMPLANT
DRAPE SURG ORHT 6 SPLT 77X108 (DRAPES) ×2 IMPLANT
DRAPE U-SHAPE 47X51 STRL (DRAPES) ×2 IMPLANT
DRSG MEPILEX BORDER 4X4 (GAUZE/BANDAGES/DRESSINGS) ×1 IMPLANT
DRSG MEPILEX BORDER 4X8 (GAUZE/BANDAGES/DRESSINGS) ×2 IMPLANT
ELECT BLADE TIP CTD 4 INCH (ELECTRODE) ×2 IMPLANT
ELECT REM PT RETURN 15FT ADLT (MISCELLANEOUS) ×2 IMPLANT
ELIMINATOR HOLE APEX DEPUY (Hips) ×2 IMPLANT
GLOVE BIO SURGEON STRL SZ7.5 (GLOVE) ×2 IMPLANT
GLOVE BIO SURGEON STRL SZ8 (GLOVE) ×2 IMPLANT
GLOVE BIOGEL PI IND STRL 7.0 (GLOVE) IMPLANT
GLOVE BIOGEL PI IND STRL 7.5 (GLOVE) IMPLANT
GLOVE BIOGEL PI IND STRL 8 (GLOVE) ×2 IMPLANT
GLOVE BIOGEL PI INDICATOR 7.0 (GLOVE) ×2
GLOVE BIOGEL PI INDICATOR 7.5 (GLOVE) ×1
GLOVE BIOGEL PI INDICATOR 8 (GLOVE) ×2
GLOVE SURG SS PI 7.0 STRL IVOR (GLOVE) ×3 IMPLANT
GOWN STRL REUS W/ TWL LRG LVL3 (GOWN DISPOSABLE) ×2 IMPLANT
GOWN STRL REUS W/TWL 2XL LVL3 (GOWN DISPOSABLE) ×2 IMPLANT
GOWN STRL REUS W/TWL LRG LVL3 (GOWN DISPOSABLE) ×6 IMPLANT
HEAD CERAMIC DELTA 36 PLUS 1.5 (Hips) ×1 IMPLANT
HOOD PEEL AWAY FLYTE STAYCOOL (MISCELLANEOUS) ×6 IMPLANT
KIT BASIN OR (CUSTOM PROCEDURE TRAY) ×2 IMPLANT
KIT TURNOVER KIT A (KITS) ×1 IMPLANT
LINER NEUTRAL 52X36MM PLUS 4 (Liner) ×1 IMPLANT
MANIFOLD NEPTUNE II (INSTRUMENTS) ×2 IMPLANT
NEEDLE HYPO 22GX1.5 SAFETY (NEEDLE) ×2 IMPLANT
PACK TOTAL JOINT (CUSTOM PROCEDURE TRAY) ×2 IMPLANT
PIN SECTOR W/GRIP ACE CUP 52MM (Hips) ×1 IMPLANT
PROTECTOR NERVE ULNAR (MISCELLANEOUS) ×2 IMPLANT
RETRIEVER SUT HEWSON (MISCELLANEOUS) ×2 IMPLANT
SCREW 6.5MMX25MM (Screw) ×1 IMPLANT
SUCTION FRAZIER HANDLE 12FR (TUBING) ×1
SUCTION TUBE FRAZIER 12FR DISP (TUBING) ×1 IMPLANT
SUT FIBERWIRE #2 38 REV NDL BL (SUTURE) ×6
SUT MNCRL AB 4-0 PS2 18 (SUTURE) IMPLANT
SUT VIC AB 0 CT1 36 (SUTURE) ×2 IMPLANT
SUT VIC AB 2-0 CT1 27 (SUTURE) ×4
SUT VIC AB 2-0 CT1 TAPERPNT 27 (SUTURE) ×2 IMPLANT
SUT VIC AB 3-0 SH 8-18 (SUTURE) ×2 IMPLANT
SUTURE FIBERWR#2 38 REV NDL BL (SUTURE) ×3 IMPLANT
SYR CONTROL 10ML LL (SYRINGE) ×2 IMPLANT
TAP DUOFIX SZ5 STD OFF (Hips) ×2 IMPLANT
TOWEL OR 17X26 10 PK STRL BLUE (TOWEL DISPOSABLE) ×4 IMPLANT
TOWEL OR NON WOVEN STRL DISP B (DISPOSABLE) ×2 IMPLANT
TRAY FOLEY MTR SLVR 14FR STAT (SET/KITS/TRAYS/PACK) ×1 IMPLANT
YANKAUER SUCT BULB TIP 10FT TU (MISCELLANEOUS) ×2 IMPLANT

## 2018-10-26 NOTE — Anesthesia Procedure Notes (Signed)
Spinal  Patient location during procedure: OR End time: 10/26/2018 7:58 AM Staffing Resident/CRNA: Caryl Pina T, CRNA Performed: resident/CRNA  Preanesthetic Checklist Completed: patient identified, site marked, surgical consent, pre-op evaluation, timeout performed, IV checked, risks and benefits discussed and monitors and equipment checked Spinal Block Patient position: sitting Prep: DuraPrep Patient monitoring: heart rate, cardiac monitor, continuous pulse ox and blood pressure Approach: midline Location: L4-5 Injection technique: single-shot Needle Needle type: Pencan  Needle gauge: 24 G Needle length: 9 cm Assessment Sensory level: T6 Additional Notes Expiration date of kit checked and confirmed. Patient tolerated procedure well, without complications.

## 2018-10-26 NOTE — Transfer of Care (Signed)
Immediate Anesthesia Transfer of Care Note  Patient: Amber Werner  Procedure(s) Performed: TOTAL HIP ARTHROPLASTY (Left Hip)  Patient Location: PACU  Anesthesia Type:MAC and Spinal  Level of Consciousness: awake, alert , oriented and patient cooperative  Airway & Oxygen Therapy: Patient Spontanous Breathing and Patient connected to face mask oxygen  Post-op Assessment: Report given to RN, Post -op Vital signs reviewed and stable and Patient moving all extremities  Post vital signs: Reviewed and stable  Last Vitals:  Vitals Value Taken Time  BP 110/75 10/26/2018 10:12 AM  Temp    Pulse 64 10/26/2018 10:14 AM  Resp 13 10/26/2018 10:14 AM  SpO2 96 % 10/26/2018 10:14 AM  Vitals shown include unvalidated device data.  Last Pain:  Vitals:   10/26/18 0601  TempSrc: Oral  PainSc:       Patients Stated Pain Goal: 4 (56/21/30 8657)  Complications: No apparent anesthesia complications
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
## 2018-10-26 NOTE — Op Note (Signed)
10/26/2018  9:41 AM  PATIENT:  Amber Werner   MRN: [id number]  PRE-OPERATIVE DIAGNOSIS: Left hip primary localized osteoarthritis  POST-OPERATIVE DIAGNOSIS:  same  PROCEDURE:  Procedure(s): TOTAL HIP ARTHROPLASTY  PREOPERATIVE INDICATIONS:    Amber Werner is an 63 y.o. female who has a diagnosis of left hip primary localized osteoarthritis and elected for surgical management after failing conservative treatment.  The risks benefits and alternatives were discussed with the patient including but not limited to the risks of nonoperative treatment, versus surgical intervention including infection, bleeding, nerve injury, periprosthetic fracture, the need for revision surgery, dislocation, leg length discrepancy, blood clots, cardiopulmonary complications, morbidity, mortality, among others, and they were willing to proceed.     OPERATIVE REPORT     SURGEON:  Marchia Bond, MD    ASSISTANT:  Joya Gaskins, OPA-C  (Present throughout the entire procedure,  necessary for completion of procedure in a timely manner, assisting with retraction, instrumentation, and closure)     ANESTHESIA: Spinal  ESTIMATED BLOOD LOSS: 315 mL    COMPLICATIONS:  None.     UNIQUE ASPECTS OF THE CASE: I reamed the acetabulum twice, the first time I prepared the cup but the sclerotic bone was not removed, and I had not medialized at all, and the implant did not want to fully seat.  Therefore I went back, we reamed, and got through the sclerotic bone and had a good bed for implantation.  COMPONENTS:  [employer] fit femur size 5 with a 36 mm + 1.5 ceramic head ball and a Gription Acetabular shell size 52, with a single cancellous screw for backup fixation, with an apex hole eliminator and a +4 neutral polyethylene liner.    PROCEDURE IN DETAIL:   The patient was met in the holding area and  identified.  The appropriate hip was identified and marked at the operative site.  The patient was  then transported to the OR  and  placed under anesthesia.  At that point, the patient was  placed in the lateral decubitus position with the operative side up and  secured to the operating room table and all bony prominences padded.     The operative lower extremity was prepped from the iliac crest to the distal leg.  Sterile draping was performed.  Time out was performed prior to incision.      A routine posterolateral approach was utilized via sharp dissection  carried down to the subcutaneous tissue.  Gross bleeders were Bovie coagulated.  The iliotibial band was identified and incised along the length of the skin incision.  Self-retaining retractors were  inserted.  With the hip internally rotated, the short external rotators  were identified. The piriformis and capsule was tagged with FiberWire, and the hip capsule released in a T-type fashion.  The femoral neck was exposed, and I resected the femoral neck using the appropriate jig. This was performed at approximately a thumb's breadth above the lesser trochanter.    I then exposed the deep acetabulum, cleared out any tissue including the ligamentum teres.  A wing retractor was placed.  After adequate visualization, I excised the labrum, and then sequentially reamed.  I placed the trial acetabulum, which seated nicely, and then impacted the real cup into place.  Upon the first placement, the cup did not seat fully, and I removed the cup and remove the cortical rim, to allow full seating the second time.  Appropriate version and inclination was confirmed clinically matching their  bony anatomy, and also with the use of the jig.  I placed a cancellous screw to augment fixation.  A trial polyethylene liner was placed and the wing retractor removed.    I then prepared the proximal femur using the cookie-cutter, the lateralizing reamer, and then sequentially reamed and broached.  A trial broach, neck, and head was utilized, and I reduced the hip and it  was found to have excellent stability with functional range of motion. The trial components were then removed, and the real polyethylene liner was placed.  I then impacted the real femoral prosthesis into place into the appropriate version, and I impacted the real head ball into place. The hip was then reduced and taken through functional range of motion and found to have excellent stability. Leg lengths were restored.  I then used a 2 mm drill bits to pass the FiberWire suture from the capsule and piriformis through the greater trochanter, and secured this. Excellent posterior capsular repair was achieved. I also closed the T in the capsule.  I then irrigated the hip copiously again with pulse lavage, and repaired the fascia with Vicryl, followed by Vicryl for the subcutaneous tissue, Monocryl for the skin, Steri-Strips and sterile gauze. The wounds were injected. The patient was then awakened and returned to PACU in stable and satisfactory condition. There were no complications.  Marchia Bond, MD Orthopedic Surgeon [phone]   10/26/2018 9:41 AM

## 2018-10-26 NOTE — Anesthesia Postprocedure Evaluation (Signed)
Anesthesia Post Note  Patient: Amber Werner  Procedure(s) Performed: TOTAL HIP ARTHROPLASTY (Left Hip)     Patient location during evaluation: PACU Anesthesia Type: Spinal Level of consciousness: awake and alert Pain management: pain level controlled Vital Signs Assessment: post-procedure vital signs reviewed and stable Respiratory status: spontaneous breathing and respiratory function stable Cardiovascular status: blood pressure returned to baseline and stable Postop Assessment: no headache, no backache, spinal receding and no apparent nausea or vomiting Anesthetic complications: no    Last Vitals:  Vitals:   10/26/18 1140 10/26/18 1243  BP: 121/79 139/71  Pulse: 62 68  Resp: 14 15  Temp: (!) 36.4 C (!) 36 C  SpO2: 100% 100%    Last Pain:  Vitals:   10/26/18 1243  TempSrc: Axillary  PainSc:                  [name]

## 2018-10-26 NOTE — Discharge Instructions (Signed)

## 2018-10-26 NOTE — Plan of Care (Signed)
Plan of care for post op day 0 discussed with patient.

## 2018-10-26 NOTE — Progress Notes (Signed)
Called to assess patient's leg length, patient very distressed about the possibility of having a leg length discrepancy.  On examination, with her laying supine, and the legs neutrally aligned within the bed, her medial malleoli are nearly identically symmetrical in length.  When ambulating, EHL and FHL are intact, she initially was utilizing a protective hip gait, and was not placing her operative leg flat on the ground.  After cueing her to apply heel strike normally, she was able to restore a more normal gait.  I have reviewed her radiographs, and measuring the tip of the greater trochanter to the lateralmost aspect of the acetabulum, it appears that these lengths are nearly identically equal.  Additionally, using Shenton's line, it appears appropriate in length, and it is difficult to assess across the level of the ischial tuberosities because of her pre-existing pelvic fractures, however grossly her length looks appropriate on radiographs.  Overall reassurance offered, I do not see evidence for significant leg length discrepancy, she does however have a right arthritic hip, and has lost some length on that side, and so at some point when she ends up having the right hip replaced she will likely regain some length on that side.  Having said that I believe that the majority of her leg length discrepancy currently is more dynamic, and muscular in nature, given the fact that she just had surgery today.  Reassurance offered, plan to continue with physical therapy accordingly.  Johnny Bridge, MD

## 2018-10-26 NOTE — Evaluation (Signed)
Physical Therapy Evaluation Patient Details Name: Amber Werner MRN: [id number] DOB: Sep 29, 1955 Today's Date: 10/26/2018   History of Present Illness  63 yo female s/p L THA-posterior approach 10/26/18.   Clinical Impression  On eval POD 0, pt required Min assist for mobility. She walked ~200 feet with a RW. Moderate pain with activity. Once up and ambulating, pt verbalized feeling of leg length discrepancy. When standing statically, pt had to stand on forefoot/toes of R foot to compensate. Pt was upset (near tears at one point). Encouraged pt to discuss issue with PA/surgeon in the a.m. Pt requested to speak with surgeon this evening-made RN aware. Will continue to follow and progress activity as tolerated.     Follow Up Recommendations Follow surgeon's recommendation for DC plan and follow-up therapies    Equipment Recommendations  None recommended by PT    Recommendations for Other Services       Precautions / Restrictions Precautions Precautions: Fall;Posterior Hip Precaution Booklet Issued: Yes (comment) Precaution Comments: Pt able to recall 3/3 hip precautions but she needs cues to adhere to them when mobilizing Restrictions Weight Bearing Restrictions: No Other Position/Activity Restrictions: WBAT      Mobility  Bed Mobility Overal bed mobility: Needs Assistance Bed Mobility: Supine to Sit     Supine to sit: Min assist;HOB elevated     General bed mobility comments: Assist to guide L LE and ensure adherence to hip precautions. Cues for safety, technique, sequence.   Transfers Overall transfer level: Needs assistance Equipment used: Rolling walker (2 wheeled) Transfers: Sit to/from Stand Sit to Stand: Min assist         General transfer comment: VCs safety, technique, hand/LE placement. Assist to rise, stabilize, control descent.   Ambulation/Gait Ambulation/Gait assistance: Min guard Gait Distance (Feet): 200 Feet Assistive device: Rolling walker (2  wheeled) Gait Pattern/deviations: Step-through pattern;Step-to pattern;Decreased stride length     General Gait Details: close guard for safety. VCs safety, sequence. Pt transitioned to reciprocal gait pattern as distance progressed. Pt requested to walk long distance.   Stairs            Wheelchair Mobility    Modified Rankin (Stroke Patients Only)       Balance Overall balance assessment: Needs assistance         Standing balance support: Bilateral upper extremity supported Standing balance-Leahy Scale: Poor                               Pertinent Vitals/Pain Pain Assessment: 0-10 Pain Score: 7  Pain Location: L hip Pain Descriptors / Indicators: Sore;Aching Pain Intervention(s): Monitored during session;Repositioned;Ice applied    Home Living Family/patient expects to be discharged to:: Private residence Living Arrangements: Spouse/significant other   Type of Home: House Home Access: Stairs to enter   [employer] of Steps: 1 Home Layout: One level;Able to live on main level with bedroom/bathroom Home Equipment: Gilford Rile - 2 wheels;Bedside commode      Prior Function Level of Independence: Independent               Hand Dominance        Extremity/Trunk Assessment   Upper Extremity Assessment Upper Extremity Assessment: Overall WFL for tasks assessed    Lower Extremity Assessment Lower Extremity Assessment: Generalized weakness;LLE deficits/detail LLE Deficits / Details: Leg length discrepancy-pt verbalized (upset). Able to visualize difference in lengths in reclined sitting and during ambulation (pt on toes on R  LE to compensate)    Cervical / Trunk Assessment Cervical / Trunk Assessment: Normal  Communication   Communication: No difficulties  Cognition Arousal/Alertness: Awake/alert Behavior During Therapy: WFL for tasks assessed/performed Overall Cognitive Status: Within Functional Limits for tasks assessed                                         General Comments      Exercises     Assessment/Plan    PT Assessment Patient needs continued PT services  PT Problem List Decreased strength;Decreased range of motion;Decreased mobility;Decreased activity tolerance;Decreased balance;Decreased knowledge of use of DME;Pain;Decreased knowledge of precautions       PT Treatment Interventions DME instruction;Gait training;Stair training;Functional mobility training;Balance training;Patient/family education;Therapeutic activities;Therapeutic exercise    PT Goals (Current goals can be found in the Care Plan section)  Acute Rehab PT Goals Patient Stated Goal: regain PLOF/independence PT Goal Formulation: With patient Time For Goal Achievement: 11/09/18 Potential to Achieve Goals: Good    Frequency 7X/week   Barriers to discharge        Co-evaluation               AM-PAC PT "6 Clicks" Mobility  Outcome Measure Help needed turning from your back to your side while in a flat bed without using bedrails?: A Little Help needed moving from lying on your back to sitting on the side of a flat bed without using bedrails?: A Little Help needed moving to and from a bed to a chair (including a wheelchair)?: A Little Help needed standing up from a chair using your arms (e.g., wheelchair or bedside chair)?: A Little Help needed to walk in hospital room?: A Little Help needed climbing 3-5 steps with a railing? : A Little 6 Click Score: 18    End of Session Equipment Utilized During Treatment: Gait belt Activity Tolerance: Patient tolerated treatment well Patient left: in chair;with call bell/phone within reach   PT Visit Diagnosis: Pain;Other abnormalities of gait and mobility (R26.89) Pain - Right/Left: Left Pain - part of body: Hip    Time: 6712-4580 PT Time Calculation (min) (ACUTE ONLY): 25 min   Charges:   PT Evaluation $PT Eval Low Complexity: 1 Low PT Treatments $Gait  Training: 8-22 mins          Weston Anna, PT Acute Rehabilitation Services Pager: [phone] Office: [phone]

## 2018-10-27 ENCOUNTER — Encounter (HOSPITAL_COMMUNITY): Payer: Self-pay | Admitting: Orthopedic Surgery

## 2018-10-27 LAB — CBC
HCT: 32.1 % — ABNORMAL LOW (ref 36.0–46.0)
Hemoglobin: 10 g/dL — ABNORMAL LOW (ref 12.0–15.0)
MCH: 29.5 pg (ref 26.0–34.0)
MCHC: 31.2 g/dL (ref 30.0–36.0)
MCV: 94.7 fL (ref 80.0–100.0)
Platelets: 276 10*3/uL (ref 150–400)
RBC: 3.39 MIL/uL — ABNORMAL LOW (ref 3.87–5.11)
RDW: 13 % (ref 11.5–15.5)
WBC: 12 10*3/uL — ABNORMAL HIGH (ref 4.0–10.5)
nRBC: 0 % (ref 0.0–0.2)

## 2018-10-27 LAB — BASIC METABOLIC PANEL
Anion gap: 7 (ref 5–15)
BUN: 13 mg/dL (ref 6–20)
CO2: 27 mmol/L (ref 22–32)
Calcium: 8.7 mg/dL — ABNORMAL LOW (ref 8.9–10.3)
Chloride: 103 mmol/L (ref 98–111)
Creatinine, Ser: 0.82 mg/dL (ref 0.44–1.00)
GFR calc Af Amer: >60 mL/min (ref >60)
GFR calc non Af Amer: >60 mL/min (ref >60)
Glucose, Bld: 135 mg/dL — ABNORMAL HIGH (ref 70–99)
Potassium: 3.9 mmol/L (ref 3.5–5.1)
Sodium: 137 mmol/L (ref 135–145)

## 2018-10-27 MED ORDER — ASPIRIN EC 325 MG PO TBEC: 325.0000 mg | DELAYED_RELEASE_TABLET | Freq: Two times a day (BID) | ORAL | 0 refills | Status: DC

## 2018-10-27 MED ORDER — ONDANSETRON HCL 4 MG PO TABS: 4.0000 mg | ORAL_TABLET | Freq: Three times a day (TID) | ORAL | 0 refills | Status: DC | PRN

## 2018-10-27 MED ORDER — SENNA-DOCUSATE SODIUM 8.6-50 MG PO TABS: 2.0000 | ORAL_TABLET | Freq: Every day | ORAL | 1 refills | Status: DC

## 2018-10-27 MED ORDER — HYDROCODONE-ACETAMINOPHEN 10-325 MG PO TABS: 1.0000 | ORAL_TABLET | Freq: Four times a day (QID) | ORAL | 0 refills | Status: DC | PRN
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
## 2018-10-27 NOTE — Evaluation (Signed)
Occupational Therapy Evaluation Patient Details Name: Amber Werner MRN: [id number] DOB: [birth year] Today's Date: 10/27/2018    History of Present Illness 63 yo female s/p L THA-posterior approach 10/26/18.    Clinical Impression   OT eval and education complete regarding ADL activity s/p THA with hip precautions.  AE issued.    Follow Up Recommendations  No OT follow up    Equipment Recommendations  None recommended by OT    Recommendations for Other Services       Precautions / Restrictions Precautions Precautions: Fall;Posterior Hip Precaution Booklet Issued: Yes (comment) Precaution Comments: Pt able to recall 3/3 hip precautions but she needs cues to adhere to them when mobilizing Restrictions Weight Bearing Restrictions: No Other Position/Activity Restrictions: WBAT      Mobility Bed Mobility Overal bed mobility: Needs Assistance Bed Mobility: Sit to Supine       Sit to supine: Min guard;HOB elevated   General bed mobility comments: oob in recliner  Transfers Overall transfer level: Needs assistance Equipment used: Rolling walker (2 wheeled) Transfers: Sit to/from [facility] Sit to Stand: Supervision;Min guard         General transfer comment: Close guard for safety. VCs safety, hand/ L LE placement, adherence to precautions    Balance Overall balance assessment: Needs assistance Sitting-balance support: Bilateral upper extremity supported Sitting balance-Leahy Scale: Good     Standing balance support: Bilateral upper extremity supported Standing balance-Leahy Scale: Fair                             ADL either performed or assessed with clinical judgement   ADL Overall ADL's : Needs assistance/impaired         Upper Body Bathing: Set up;Sitting   Lower Body Bathing: Minimal assistance;Sit to/from stand;Cueing for sequencing;Cueing for safety   Upper Body Dressing : Set up;Sitting   Lower Body Dressing:  Minimal assistance;Sit to/from stand;Cueing for sequencing;Cueing for safety   Toilet Transfer: Min guard;Ambulation;RW;Comfort height toilet;Cueing for safety;Adhering to hip precautions   Toileting- Clothing Manipulation and Hygiene: Sit to/from stand;Cueing for sequencing;Cueing for safety;Min guard;Adhering to hip precautions     Tub/Shower Transfer Details (indicate cue type and reason): verbalized safety.   Functional mobility during ADLs: Cueing for safety;Cueing for sequencing;Rolling walker General ADL Comments: AE issued and pt educated on use.  Pt verbalized all hip precautions.  Husband will A as needed     Vision Baseline Vision/History: No visual deficits Patient Visual Report: No change from baseline              Pertinent Vitals/Pain Pain Assessment: 0-10 Pain Score: 5  Pain Location: L hip Pain Descriptors / Indicators: Sore;Aching Pain Intervention(s): Limited activity within patient's tolerance;Repositioned;Patient requesting pain meds-RN notified     Hand Dominance     Extremity/Trunk Assessment Upper Extremity Assessment Upper Extremity Assessment: Overall WFL for tasks assessed           Communication Communication Communication: No difficulties   Cognition Arousal/Alertness: Awake/alert Behavior During Therapy: WFL for tasks assessed/performed Overall Cognitive Status: Within Functional Limits for tasks assessed                                          Home Living Family/patient expects to be discharged to:: Private residence Living Arrangements: Spouse/significant other   Type of Home: [place]  Access: Stairs to enter [employer] of Steps: 1   Home Layout: One level;Able to live on main level with bedroom/bathroom               Home Equipment: Gilford Rile - 2 wheels;Bedside commode          Prior Functioning/Environment Level of Independence: Independent                          OT  Goals(Current goals can be found in the care plan section) Acute Rehab OT Goals Patient Stated Goal: regain PLOF/independence OT Goal Formulation: With patient  OT Frequency:      AM-PAC OT "6 Clicks" Daily Activity     Outcome Measure Help from another person eating meals?: None Help from another person taking care of personal grooming?: None Help from another person toileting, which includes using toliet, bedpan, or urinal?: A Little Help from another person bathing (including washing, rinsing, drying)?: A Little Help from another person to put on and taking off regular upper body clothing?: None Help from another person to put on and taking off regular lower body clothing?: A Little 6 Click Score: 21   End of Session Equipment Utilized During Treatment: Rolling walker Nurse Communication: Mobility status  Activity Tolerance: Patient tolerated treatment well Patient left: in chair;with call bell/phone within reach                   Time: 1410-1431 OT Time Calculation (min): 21 min Charges:  OT General Charges $OT Visit: 1 Visit OT Evaluation $OT Eval Low Complexity: 1 Low  Kari Baars, OT Acute Rehabilitation Services [phone] Office- [place] [address]     [place], Edwena Felty D 10/27/2018, 3:08 PM

## 2018-10-27 NOTE — Progress Notes (Signed)
Physical Therapy Treatment Patient Details Name: Amber Werner MRN: [id number] DOB: Mar 29, 1956 Today's Date: 10/27/2018    History of Present Illness 63 yo female s/p L THA-posterior approach 10/26/18.     PT Comments    Progressing well with mobility. Reviewed hip precautions. Practiced gait and stair training. Will plan to have a 2nd session to practice stair training again since pt shared she has 2 steps inside the home as well. Pt stated she is unsure if she plans to leave today or tomorrow. PA arrived as therapist was exiting room-will await d/c plan recommendations.    Follow Up Recommendations  Follow surgeon's recommendation for DC plan and follow-up therapies     Equipment Recommendations  None recommended by PT    Recommendations for Other Services       Precautions / Restrictions Precautions Precautions: Fall;Posterior Hip Precaution Comments: Pt able to recall 3/3 hip precautions but she needs cues to adhere to them when mobilizing Restrictions Weight Bearing Restrictions: No Other Position/Activity Restrictions: WBAT    Mobility  Bed Mobility Overal bed mobility: Needs Assistance Bed Mobility: Sit to Supine       Sit to supine: Min guard;HOB elevated   General bed mobility comments: Close guard for safety. Increased time. Cues for safety, adherence to precautions-recommended to pt that she avoid crossing legs.   Transfers Overall transfer level: Needs assistance Equipment used: Rolling walker (2 wheeled) Transfers: Sit to/from Stand Sit to Stand: Min guard         General transfer comment: Close guard for safety. VCs safety, hand/ L LE placement.   Ambulation/Gait Ambulation/Gait assistance: Min guard Gait Distance (Feet): 200 Feet(150x1) Assistive device: Rolling walker (2 wheeled) Gait Pattern/deviations: Step-through pattern;Decreased stride length     General Gait Details: Walked x 2-on 2nd walk, had pt wear shoe on R foot to compensate  for leg length discrepancy feeling. Pt reported that this felt better.    Stairs Stairs: Yes Stairs assistance: Min guard Stair Management: Step to pattern;Forwards;With walker Number of Stairs: 1 General stair comments: VCs safety, sequence. Close guard for safety.    Wheelchair Mobility    Modified Rankin (Stroke Patients Only)       Balance Overall balance assessment: Needs assistance         Standing balance support: Bilateral upper extremity supported Standing balance-Leahy Scale: Poor                              Cognition Arousal/Alertness: Awake/alert Behavior During Therapy: WFL for tasks assessed/performed Overall Cognitive Status: Within Functional Limits for tasks assessed                                        Exercises Total Joint Exercises Ankle Circles/Pumps: AROM;Both;10 reps;Seated Quad Sets: AROM;Both;10 reps;Seated Heel Slides: AAROM;Left;10 reps;Supine Hip ABduction/ADduction: AAROM;Left;10 reps;Supine    General Comments        Pertinent Vitals/Pain Pain Assessment: 0-10 Pain Score: 7  Pain Location: L hip Pain Descriptors / Indicators: Sore;Aching Pain Intervention(s): Monitored during session;Repositioned;Ice applied    Home Living                      Prior Function            PT Goals (current goals can now be found in the care plan section) Progress  towards PT goals: Progressing toward goals    Frequency    7X/week      PT Plan Current plan remains appropriate    Co-evaluation              AM-PAC PT "6 Clicks" Mobility   Outcome Measure  Help needed turning from your back to your side while in a flat bed without using bedrails?: A Little Help needed moving from lying on your back to sitting on the side of a flat bed without using bedrails?: A Little Help needed moving to and from a bed to a chair (including a wheelchair)?: A Little Help needed standing up from a chair  using your arms (e.g., wheelchair or bedside chair)?: A Little Help needed to walk in hospital room?: A Little Help needed climbing 3-5 steps with a railing? : A Little 6 Click Score: 18    End of Session Equipment Utilized During Treatment: Gait belt Activity Tolerance: Patient tolerated treatment well Patient left: in bed;with call bell/phone within reach   PT Visit Diagnosis: Pain;Other abnormalities of gait and mobility (R26.89) Pain - Right/Left: Left Pain - part of body: Hip     Time: 5521-7471 PT Time Calculation (min) (ACUTE ONLY): 38 min  Charges:  $Gait Training: 23-37 mins $Therapeutic Exercise: 8-22 mins                       Weston Anna, PT Acute Rehabilitation Services Pager: [phone] Office: [phone]

## 2018-10-27 NOTE — Progress Notes (Addendum)
Patient ID: Amber Werner, female   DOB: Feb 19, 1956, 63 y.o.   MRN: [id number]     Subjective:  Patient reports pain as mild.  Patient in bed and in no acute distress denies any CP or SOB  Objective:   VITALS:   Vitals:   10/26/18 2111 10/27/18 0107 10/27/18 0500 10/27/18 0810  BP: 113/65 (!) 121/57 118/76 116/66  Pulse: 72 69 78 69  Resp: 18 18 14 16   Temp: 97.6 F (36.4 C) 98.2 F (36.8 C) (!) 97.5 F (36.4 C) 98 F (36.7 C)  TempSrc:  Oral Oral Oral  SpO2: 100% 98% 100% 99%  Weight:      Height:        ABD soft Sensation intact distally Dorsiflexion/Plantar flexion intact Incision: dressing C/D/I and no drainage   Lab Results  Component Value Date   WBC 12.0 (H) 10/27/2018   HGB 10.0 (L) 10/27/2018   HCT 32.1 (L) 10/27/2018   MCV 94.7 10/27/2018   PLT 276 10/27/2018   BMET    Component Value Date/Time   NA 137 10/27/2018 0315   K 3.9 10/27/2018 0315   CL 103 10/27/2018 0315   CO2 27 10/27/2018 0315   GLUCOSE 135 (H) 10/27/2018 0315   BUN 13 10/27/2018 0315   CREATININE 0.82 10/27/2018 0315   CREATININE 1.02 01/14/2013 1146   CALCIUM 8.7 (L) 10/27/2018 0315   GFRNONAA >60 10/27/2018 0315   GFRAA >60 10/27/2018 0315     Assessment/Plan: 1 Day Post-Op   Principal Problem:   Primary localized osteoarthritis of left hip Active Problems:   S/P total hip arthroplasty   Advance diet Up with therapy Discharge home with home health after PT in the afternoon WBAT Dry dressing PRN Follow up with Dr Mardelle Matte in 2 weeks   Lunette Stands 10/27/2018, 11:17 AM  Discussed and agree with above.   Marchia Bond, MD Cell [phone]

## 2018-10-27 NOTE — Progress Notes (Signed)
Physical Therapy Treatment Patient Details Name: Amber Werner MRN: [id number] DOB: January 19, 1956 Today's Date: 10/27/2018    History of Present Illness 63 yo female s/p L THA-posterior approach 10/26/18.     PT Comments    Progressing well with mobility. Reviewed/practiced gait training and stair training. Demonstrated car transfer technique. All education completed. Okay to d/c from PT standpoint-made RN aware.    Follow Up Recommendations  Follow surgeon's recommendation for DC plan and follow-up therapies     Equipment Recommendations  None recommended by PT    Recommendations for Other Services       Precautions / Restrictions Precautions Precautions: Fall;Posterior Hip Precaution Booklet Issued: Yes (comment) Precaution Comments: Pt able to recall 3/3 hip precautions but she needs cues to adhere to them when mobilizing Restrictions Weight Bearing Restrictions: No Other Position/Activity Restrictions: WBAT    Mobility  Bed Mobility Overal bed mobility: Needs Assistance Bed Mobility: Sit to Supine          General bed mobility comments: oob in recliner  Transfers Overall transfer level: Needs assistance Equipment used: Rolling walker (2 wheeled) Transfers: Sit to/from Stand Sit to Stand: Supervision         General transfer comment: Close guard for safety. VCs safety, hand/ L LE placement, adherence to precautions  Ambulation/Gait Ambulation/Gait assistance: Supervision Gait Distance (Feet): 300 Feet Assistive device: Rolling walker (2 wheeled) Gait Pattern/deviations: Step-through pattern;Decreased stride length     General Gait Details: for safety. Pt requested to walk long distance 2* hip feeling tight.    Stairs Stairs: Yes Stairs assistance: Min guard Stair Management: Forwards;One rail Right Number of Stairs: 2 General stair comments: VCs safety, sequence. Close guard for safety.    Wheelchair Mobility    Modified Rankin (Stroke  Patients Only)       Balance Overall balance assessment: Needs assistance         Standing balance support: Bilateral upper extremity supported Standing balance-Leahy Scale: Fair                              Cognition Arousal/Alertness: Awake/alert Behavior During Therapy: WFL for tasks assessed/performed Overall Cognitive Status: Within Functional Limits for tasks assessed                                        Exercises Total Joint Exercises Ankle Circles/Pumps: AROM;Both;10 reps;Seated Quad Sets: AROM;Both;10 reps;Seated Heel Slides: AAROM;Left;10 reps;Supine Hip ABduction/ADduction: AAROM;Left;10 reps;Supine    General Comments        Pertinent Vitals/Pain Pain Assessment: 0-10 Pain Score: 7  Pain Location: L hip Pain Descriptors / Indicators: Sore;Aching Pain Intervention(s): Monitored during session;Ice applied    Home Living                      Prior Function            PT Goals (current goals can now be found in the care plan section) Progress towards PT goals: Progressing toward goals    Frequency    7X/week      PT Plan Current plan remains appropriate    Co-evaluation              AM-PAC PT "6 Clicks" Mobility   Outcome Measure  Help needed turning from your back to your side while in a flat bed  without using bedrails?: A Little Help needed moving from lying on your back to sitting on the side of a flat bed without using bedrails?: A Little Help needed moving to and from a bed to a chair (including a wheelchair)?: A Little Help needed standing up from a chair using your arms (e.g., wheelchair or bedside chair)?: A Little Help needed to walk in hospital room?: A Little Help needed climbing 3-5 steps with a railing? : A Little 6 Click Score: 18    End of Session Equipment Utilized During Treatment: Gait belt Activity Tolerance: Patient tolerated treatment well Patient left: in chair;with  call bell/phone within reach   PT Visit Diagnosis: Pain;Other abnormalities of gait and mobility (R26.89) Pain - Right/Left: Left Pain - part of body: Hip     Time: 8144-8185 PT Time Calculation (min) (ACUTE ONLY): 24 min  Charges:  $Gait Training: 23-37 mins $Therapeutic Exercise: 8-22 mins                        Weston Anna, PT Acute Rehabilitation Services Pager: [phone] Office: [phone]

## 2018-10-28 ENCOUNTER — Other Ambulatory Visit: Payer: Self-pay | Admitting: *Deleted

## 2018-10-28 NOTE — Patient Outreach (Addendum)
[facility]) Care Management  10/28/2018  Amber Werner 1956/04/03 [id number]    Transition of care call   Referral received:  10/27/2018 Initial outreach:  10/28/2018 Insurance: [employer]   Subjective: Initial successful telephone call to patient's preferred number in order to complete transition of care assessment; 2 HIPAA identifiers verified. Explained purpose of call and completed transition of care assessment.  States she is doing well, denies post op problems that have not already been resolved, states surgical pain well managed with prescribed medications, tolerating  diet , denies bowel or bladder problems.  Spouse able  assisting  with her recovery.      Objective:  Amber Werner was hospitalized at [facility] from 10/26/2018-10/27/2018 for left hip surgery.  She was discharged to home on 10/27/2018 without the need for home health services however pt has made the request to proceed with [facility] services for PT in the home. Pt has opt to have as her choices for services with [facility] or [facility] through her provider. Pt currently awaiting a call for the initial home visit to be arranged. Other issues now resolved involved one medication needed to be transferred to her local pharmacy. Pt received a confirmation via text during today's call concerning this resolution. No other needs presented at this time.    Assessment:  Patient voices good understanding of all discharge instructions.  See transition of care flowsheet for assessment details.   Plan:  Reviewed Amber Werner Active Health Management 2020 Wellness Requirements of: Completing the computerized Health Assessment and the Health Action Step with Active Health Management [facility]) by February 15 2019 AND have an annual physical between June 16, 2017 and December 15, 2018.  No ongoing care management needs identified so will close case to [place] [facility] care  management services and route successful outreach letter with [place] Management pamphlet and 24 Hour Nurse Line Magnet to [facility] clinical pool to be mailed to patient's home address.   Amber Mina, RN Care Management Coordinator [place] Office [phone]

## 2018-11-01 DIAGNOSIS — Z471 Aftercare following joint replacement surgery: Secondary | ICD-10-CM | POA: Diagnosis not present

## 2018-11-01 DIAGNOSIS — M47812 Spondylosis without myelopathy or radiculopathy, cervical region: Secondary | ICD-10-CM | POA: Diagnosis not present

## 2018-11-01 DIAGNOSIS — G4700 Insomnia, unspecified: Secondary | ICD-10-CM | POA: Diagnosis not present

## 2018-11-01 DIAGNOSIS — I10 Essential (primary) hypertension: Secondary | ICD-10-CM | POA: Diagnosis not present

## 2018-11-01 DIAGNOSIS — Z8639 Personal history of other endocrine, nutritional and metabolic disease: Secondary | ICD-10-CM | POA: Diagnosis not present

## 2018-11-01 DIAGNOSIS — J329 Chronic sinusitis, unspecified: Secondary | ICD-10-CM | POA: Diagnosis not present

## 2018-11-01 DIAGNOSIS — Z8614 Personal history of Methicillin resistant Staphylococcus aureus infection: Secondary | ICD-10-CM | POA: Diagnosis not present

## 2018-11-01 DIAGNOSIS — E039 Hypothyroidism, unspecified: Secondary | ICD-10-CM | POA: Diagnosis not present

## 2018-11-01 DIAGNOSIS — M1612 Unilateral primary osteoarthritis, left hip: Secondary | ICD-10-CM | POA: Diagnosis not present

## 2018-11-03 DIAGNOSIS — Z8614 Personal history of Methicillin resistant Staphylococcus aureus infection: Secondary | ICD-10-CM | POA: Diagnosis not present

## 2018-11-03 DIAGNOSIS — M47812 Spondylosis without myelopathy or radiculopathy, cervical region: Secondary | ICD-10-CM | POA: Diagnosis not present

## 2018-11-03 DIAGNOSIS — J329 Chronic sinusitis, unspecified: Secondary | ICD-10-CM | POA: Diagnosis not present

## 2018-11-03 DIAGNOSIS — I10 Essential (primary) hypertension: Secondary | ICD-10-CM | POA: Diagnosis not present

## 2018-11-03 DIAGNOSIS — M1612 Unilateral primary osteoarthritis, left hip: Secondary | ICD-10-CM | POA: Diagnosis not present

## 2018-11-03 DIAGNOSIS — E039 Hypothyroidism, unspecified: Secondary | ICD-10-CM | POA: Diagnosis not present

## 2018-11-03 DIAGNOSIS — Z471 Aftercare following joint replacement surgery: Secondary | ICD-10-CM | POA: Diagnosis not present

## 2018-11-03 DIAGNOSIS — G4700 Insomnia, unspecified: Secondary | ICD-10-CM | POA: Diagnosis not present

## 2018-11-03 DIAGNOSIS — Z8639 Personal history of other endocrine, nutritional and metabolic disease: Secondary | ICD-10-CM | POA: Diagnosis not present

## 2018-11-05 DIAGNOSIS — G4700 Insomnia, unspecified: Secondary | ICD-10-CM | POA: Diagnosis not present

## 2018-11-05 DIAGNOSIS — I10 Essential (primary) hypertension: Secondary | ICD-10-CM | POA: Diagnosis not present

## 2018-11-05 DIAGNOSIS — Z8639 Personal history of other endocrine, nutritional and metabolic disease: Secondary | ICD-10-CM | POA: Diagnosis not present

## 2018-11-05 DIAGNOSIS — M47812 Spondylosis without myelopathy or radiculopathy, cervical region: Secondary | ICD-10-CM | POA: Diagnosis not present

## 2018-11-05 DIAGNOSIS — Z8614 Personal history of Methicillin resistant Staphylococcus aureus infection: Secondary | ICD-10-CM | POA: Diagnosis not present

## 2018-11-05 DIAGNOSIS — J329 Chronic sinusitis, unspecified: Secondary | ICD-10-CM | POA: Diagnosis not present

## 2018-11-05 DIAGNOSIS — E039 Hypothyroidism, unspecified: Secondary | ICD-10-CM | POA: Diagnosis not present

## 2018-11-05 DIAGNOSIS — M1612 Unilateral primary osteoarthritis, left hip: Secondary | ICD-10-CM | POA: Diagnosis not present

## 2018-11-05 DIAGNOSIS — Z471 Aftercare following joint replacement surgery: Secondary | ICD-10-CM | POA: Diagnosis not present

## 2018-11-08 DIAGNOSIS — J329 Chronic sinusitis, unspecified: Secondary | ICD-10-CM | POA: Diagnosis not present

## 2018-11-08 DIAGNOSIS — G4700 Insomnia, unspecified: Secondary | ICD-10-CM | POA: Diagnosis not present

## 2018-11-08 DIAGNOSIS — I10 Essential (primary) hypertension: Secondary | ICD-10-CM | POA: Diagnosis not present

## 2018-11-08 DIAGNOSIS — M1612 Unilateral primary osteoarthritis, left hip: Secondary | ICD-10-CM | POA: Diagnosis not present

## 2018-11-08 DIAGNOSIS — Z8614 Personal history of Methicillin resistant Staphylococcus aureus infection: Secondary | ICD-10-CM | POA: Diagnosis not present

## 2018-11-08 DIAGNOSIS — Z8639 Personal history of other endocrine, nutritional and metabolic disease: Secondary | ICD-10-CM | POA: Diagnosis not present

## 2018-11-08 DIAGNOSIS — E039 Hypothyroidism, unspecified: Secondary | ICD-10-CM | POA: Diagnosis not present

## 2018-11-08 DIAGNOSIS — M47812 Spondylosis without myelopathy or radiculopathy, cervical region: Secondary | ICD-10-CM | POA: Diagnosis not present

## 2018-11-08 DIAGNOSIS — Z471 Aftercare following joint replacement surgery: Secondary | ICD-10-CM | POA: Diagnosis not present

## 2018-11-10 DIAGNOSIS — M47812 Spondylosis without myelopathy or radiculopathy, cervical region: Secondary | ICD-10-CM | POA: Diagnosis not present

## 2018-11-10 DIAGNOSIS — Z8639 Personal history of other endocrine, nutritional and metabolic disease: Secondary | ICD-10-CM | POA: Diagnosis not present

## 2018-11-10 DIAGNOSIS — M1612 Unilateral primary osteoarthritis, left hip: Secondary | ICD-10-CM | POA: Diagnosis not present

## 2018-11-10 DIAGNOSIS — J329 Chronic sinusitis, unspecified: Secondary | ICD-10-CM | POA: Diagnosis not present

## 2018-11-10 DIAGNOSIS — I10 Essential (primary) hypertension: Secondary | ICD-10-CM | POA: Diagnosis not present

## 2018-11-10 DIAGNOSIS — Z8614 Personal history of Methicillin resistant Staphylococcus aureus infection: Secondary | ICD-10-CM | POA: Diagnosis not present

## 2018-11-10 DIAGNOSIS — E039 Hypothyroidism, unspecified: Secondary | ICD-10-CM | POA: Diagnosis not present

## 2018-11-10 DIAGNOSIS — Z471 Aftercare following joint replacement surgery: Secondary | ICD-10-CM | POA: Diagnosis not present

## 2018-11-10 DIAGNOSIS — G4700 Insomnia, unspecified: Secondary | ICD-10-CM | POA: Diagnosis not present

## 2018-11-12 DIAGNOSIS — M1612 Unilateral primary osteoarthritis, left hip: Secondary | ICD-10-CM | POA: Diagnosis not present

## 2018-11-12 DIAGNOSIS — I10 Essential (primary) hypertension: Secondary | ICD-10-CM | POA: Diagnosis not present

## 2018-11-12 DIAGNOSIS — E039 Hypothyroidism, unspecified: Secondary | ICD-10-CM | POA: Diagnosis not present

## 2018-11-12 DIAGNOSIS — M47812 Spondylosis without myelopathy or radiculopathy, cervical region: Secondary | ICD-10-CM | POA: Diagnosis not present

## 2018-11-12 DIAGNOSIS — G4700 Insomnia, unspecified: Secondary | ICD-10-CM | POA: Diagnosis not present

## 2018-11-12 DIAGNOSIS — Z8639 Personal history of other endocrine, nutritional and metabolic disease: Secondary | ICD-10-CM | POA: Diagnosis not present

## 2018-11-12 DIAGNOSIS — Z471 Aftercare following joint replacement surgery: Secondary | ICD-10-CM | POA: Diagnosis not present

## 2018-11-12 DIAGNOSIS — J329 Chronic sinusitis, unspecified: Secondary | ICD-10-CM | POA: Diagnosis not present

## 2018-11-12 DIAGNOSIS — Z8614 Personal history of Methicillin resistant Staphylococcus aureus infection: Secondary | ICD-10-CM | POA: Diagnosis not present

## 2018-11-15 DIAGNOSIS — E039 Hypothyroidism, unspecified: Secondary | ICD-10-CM | POA: Diagnosis not present

## 2018-11-15 DIAGNOSIS — G4700 Insomnia, unspecified: Secondary | ICD-10-CM | POA: Diagnosis not present

## 2018-11-15 DIAGNOSIS — Z8614 Personal history of Methicillin resistant Staphylococcus aureus infection: Secondary | ICD-10-CM | POA: Diagnosis not present

## 2018-11-15 DIAGNOSIS — Z8639 Personal history of other endocrine, nutritional and metabolic disease: Secondary | ICD-10-CM | POA: Diagnosis not present

## 2018-11-15 DIAGNOSIS — M47812 Spondylosis without myelopathy or radiculopathy, cervical region: Secondary | ICD-10-CM | POA: Diagnosis not present

## 2018-11-15 DIAGNOSIS — Z471 Aftercare following joint replacement surgery: Secondary | ICD-10-CM | POA: Diagnosis not present

## 2018-11-15 DIAGNOSIS — M1612 Unilateral primary osteoarthritis, left hip: Secondary | ICD-10-CM | POA: Diagnosis not present

## 2018-11-15 DIAGNOSIS — I10 Essential (primary) hypertension: Secondary | ICD-10-CM | POA: Diagnosis not present

## 2018-11-15 DIAGNOSIS — J329 Chronic sinusitis, unspecified: Secondary | ICD-10-CM | POA: Diagnosis not present

## 2018-11-17 DIAGNOSIS — M47812 Spondylosis without myelopathy or radiculopathy, cervical region: Secondary | ICD-10-CM | POA: Diagnosis not present

## 2018-11-17 DIAGNOSIS — I10 Essential (primary) hypertension: Secondary | ICD-10-CM | POA: Diagnosis not present

## 2018-11-17 DIAGNOSIS — G4700 Insomnia, unspecified: Secondary | ICD-10-CM | POA: Diagnosis not present

## 2018-11-17 DIAGNOSIS — Z8614 Personal history of Methicillin resistant Staphylococcus aureus infection: Secondary | ICD-10-CM | POA: Diagnosis not present

## 2018-11-17 DIAGNOSIS — J329 Chronic sinusitis, unspecified: Secondary | ICD-10-CM | POA: Diagnosis not present

## 2018-11-17 DIAGNOSIS — Z471 Aftercare following joint replacement surgery: Secondary | ICD-10-CM | POA: Diagnosis not present

## 2018-11-17 DIAGNOSIS — E039 Hypothyroidism, unspecified: Secondary | ICD-10-CM | POA: Diagnosis not present

## 2018-11-17 DIAGNOSIS — Z8639 Personal history of other endocrine, nutritional and metabolic disease: Secondary | ICD-10-CM | POA: Diagnosis not present

## 2018-11-17 DIAGNOSIS — M1612 Unilateral primary osteoarthritis, left hip: Secondary | ICD-10-CM | POA: Diagnosis not present

## 2018-11-18 DIAGNOSIS — Z8639 Personal history of other endocrine, nutritional and metabolic disease: Secondary | ICD-10-CM | POA: Diagnosis not present

## 2018-11-18 DIAGNOSIS — E039 Hypothyroidism, unspecified: Secondary | ICD-10-CM | POA: Diagnosis not present

## 2018-11-18 DIAGNOSIS — G4700 Insomnia, unspecified: Secondary | ICD-10-CM | POA: Diagnosis not present

## 2018-11-18 DIAGNOSIS — Z471 Aftercare following joint replacement surgery: Secondary | ICD-10-CM | POA: Diagnosis not present

## 2018-11-18 DIAGNOSIS — M47812 Spondylosis without myelopathy or radiculopathy, cervical region: Secondary | ICD-10-CM | POA: Diagnosis not present

## 2018-11-18 DIAGNOSIS — J329 Chronic sinusitis, unspecified: Secondary | ICD-10-CM | POA: Diagnosis not present

## 2018-11-18 DIAGNOSIS — M1612 Unilateral primary osteoarthritis, left hip: Secondary | ICD-10-CM | POA: Diagnosis not present

## 2018-11-18 DIAGNOSIS — I10 Essential (primary) hypertension: Secondary | ICD-10-CM | POA: Diagnosis not present

## 2018-11-18 DIAGNOSIS — Z8614 Personal history of Methicillin resistant Staphylococcus aureus infection: Secondary | ICD-10-CM | POA: Diagnosis not present

## 2018-11-24 MED FILL — MELOXICAM 15 MG TABLET: 15 | 90 days supply | Qty: 90 | Fill #0

## 2018-11-24 MED FILL — LEVOTHYROXINE 137 MCG TABLE: 137 | 90 days supply | Qty: 90 | Fill #0

## 2018-11-25 DIAGNOSIS — Z96642 Presence of left artificial hip joint: Secondary | ICD-10-CM | POA: Diagnosis not present

## 2018-11-25 DIAGNOSIS — M25552 Pain in left hip: Secondary | ICD-10-CM | POA: Diagnosis not present

## 2018-12-01 DIAGNOSIS — Z96642 Presence of left artificial hip joint: Secondary | ICD-10-CM | POA: Diagnosis not present

## 2018-12-01 DIAGNOSIS — M25552 Pain in left hip: Secondary | ICD-10-CM | POA: Diagnosis not present

## 2018-12-01 MED FILL — valACYclovir HCL 1 GM TABS: 1 | 90 days supply | Qty: 45 | Fill #0

## 2018-12-01 MED FILL — CLINDAMYCIN HCL 300 MG CAPS: 300 | 14 days supply | Qty: 42 | Fill #0

## 2018-12-06 DIAGNOSIS — Z96642 Presence of left artificial hip joint: Secondary | ICD-10-CM | POA: Diagnosis not present

## 2018-12-06 DIAGNOSIS — M25552 Pain in left hip: Secondary | ICD-10-CM | POA: Diagnosis not present

## 2018-12-08 DIAGNOSIS — M1612 Unilateral primary osteoarthritis, left hip: Secondary | ICD-10-CM | POA: Diagnosis not present

## 2018-12-08 DIAGNOSIS — Z96642 Presence of left artificial hip joint: Secondary | ICD-10-CM | POA: Diagnosis not present

## 2018-12-08 DIAGNOSIS — M25552 Pain in left hip: Secondary | ICD-10-CM | POA: Diagnosis not present

## 2018-12-15 ENCOUNTER — Encounter: Payer: Self-pay | Admitting: *Deleted

## 2018-12-15 DIAGNOSIS — M25552 Pain in left hip: Secondary | ICD-10-CM | POA: Diagnosis not present

## 2018-12-15 DIAGNOSIS — Z96642 Presence of left artificial hip joint: Secondary | ICD-10-CM | POA: Diagnosis not present

## 2018-12-20 DIAGNOSIS — M25552 Pain in left hip: Secondary | ICD-10-CM | POA: Diagnosis not present

## 2018-12-20 DIAGNOSIS — Z96642 Presence of left artificial hip joint: Secondary | ICD-10-CM | POA: Diagnosis not present

## 2018-12-23 MED FILL — LOSARTAN-HCTZ 100-25 MG TAB: 100-25 | 90 days supply | Qty: 90 | Fill #1

## 2018-12-24 DIAGNOSIS — Z96642 Presence of left artificial hip joint: Secondary | ICD-10-CM | POA: Diagnosis not present

## 2018-12-24 DIAGNOSIS — M25552 Pain in left hip: Secondary | ICD-10-CM | POA: Diagnosis not present

## 2018-12-27 MED FILL — LIVALO 4 MG TABLET: 4 | 90 days supply | Qty: 90 | Fill #1

## 2018-12-28 DIAGNOSIS — Z96642 Presence of left artificial hip joint: Secondary | ICD-10-CM | POA: Diagnosis not present

## 2018-12-28 DIAGNOSIS — M25552 Pain in left hip: Secondary | ICD-10-CM | POA: Diagnosis not present

## 2018-12-29 ENCOUNTER — Encounter: Payer: Self-pay | Admitting: "Endocrinology

## 2018-12-29 ENCOUNTER — Other Ambulatory Visit: Payer: Self-pay

## 2018-12-29 ENCOUNTER — Ambulatory Visit (INDEPENDENT_AMBULATORY_CARE_PROVIDER_SITE_OTHER): Payer: 59 | Admitting: "Endocrinology

## 2018-12-29 VITALS — BP 138/74 | HR 80 | Ht 64.0 in

## 2018-12-29 DIAGNOSIS — E039 Hypothyroidism, unspecified: Secondary | ICD-10-CM
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
## 2018-12-30 LAB — LIPID PANEL
Cholesterol: 292 mg/dL — ABNORMAL HIGH (ref <200)
HDL: 89 mg/dL (ref >=50)
LDL Cholesterol (Calc): 178 mg/dL (calc) — ABNORMAL HIGH
Non-HDL Cholesterol (Calc): 203 mg/dL (calc) — ABNORMAL HIGH (ref <130)
Total CHOL/HDL Ratio: 3.3 (calc) (ref <5.0)
Triglycerides: 119 mg/dL (ref <150)

## 2018-12-30 LAB — THYROID PEROXIDASE ANTIBODY: Thyroperoxidase Ab SerPl-aCnc: 215 IU/mL — ABNORMAL HIGH (ref <9)

## 2018-12-30 LAB — T4, FREE: Free T4: 1.8 ng/dL (ref 0.8–1.8)

## 2018-12-30 LAB — VITAMIN D 25 HYDROXY (VIT D DEFICIENCY, FRACTURES): Vit D, 25-Hydroxy: 45 ng/mL (ref 30–100)

## 2018-12-30 LAB — COMPLETE METABOLIC PANEL WITH GFR
AG Ratio: 1.7 (calc) (ref 1.0–2.5)
ALT: 21 U/L (ref 6–29)
AST: 24 U/L (ref 10–35)
Albumin: 4.7 g/dL (ref 3.6–5.1)
Alkaline phosphatase (APISO): 97 U/L (ref 37–153)
BUN: 11 mg/dL (ref 7–25)
CO2: 29 mmol/L (ref 20–32)
Calcium: 9.7 mg/dL (ref 8.6–10.4)
Chloride: 97 mmol/L — ABNORMAL LOW (ref 98–110)
Creat: 0.94 mg/dL (ref 0.50–0.99)
GFR, Est African American: 76 mL/min/{1.73_m2} (ref >=60)
GFR, Est Non African American: 66 mL/min/{1.73_m2} (ref >=60)
Globulin: 2.7 g/dL (calc) (ref 1.9–3.7)
Glucose, Bld: 82 mg/dL (ref 65–99)
Potassium: 3.8 mmol/L (ref 3.5–5.3)
Sodium: 137 mmol/L (ref 135–146)
Total Bilirubin: 0.3 mg/dL (ref 0.2–1.2)
Total Protein: 7.4 g/dL (ref 6.1–8.1)

## 2018-12-30 LAB — T3, FREE: T3, Free: 3.5 pg/mL (ref 2.3–4.2)

## 2018-12-30 LAB — HEMOGLOBIN A1C
Hgb A1c MFr Bld: 5.1 % of total Hgb (ref <5.7)
Mean Plasma Glucose: 100 (calc)
eAG (mmol/L): 5.5 (calc)

## 2018-12-30 LAB — VITAMIN B12: Vitamin B-12: 993 pg/mL (ref 200–1100)

## 2018-12-30 LAB — THYROGLOBULIN ANTIBODY: Thyroglobulin Ab: 16 IU/mL — ABNORMAL HIGH (ref <=1)

## 2018-12-30 LAB — TSH: TSH: 0.1 mIU/L — ABNORMAL LOW (ref 0.40–4.50)

## 2019-01-04 DIAGNOSIS — Z96642 Presence of left artificial hip joint: Secondary | ICD-10-CM | POA: Diagnosis not present

## 2019-01-04 DIAGNOSIS — M25552 Pain in left hip: Secondary | ICD-10-CM | POA: Diagnosis not present

## 2019-01-10 DIAGNOSIS — M1611 Unilateral primary osteoarthritis, right hip: Secondary | ICD-10-CM | POA: Diagnosis not present

## 2019-01-12 ENCOUNTER — Encounter: Payer: Self-pay | Admitting: "Endocrinology

## 2019-01-12 ENCOUNTER — Ambulatory Visit (INDEPENDENT_AMBULATORY_CARE_PROVIDER_SITE_OTHER): Payer: 59 | Admitting: "Endocrinology

## 2019-01-12 ENCOUNTER — Other Ambulatory Visit: Payer: Self-pay

## 2019-01-12 VITALS — BP 140/87 | HR 86 | Ht 63.0 in | Wt 163.0 lb

## 2019-01-12 DIAGNOSIS — E038 Other specified hypothyroidism: Secondary | ICD-10-CM | POA: Diagnosis not present

## 2019-01-12 DIAGNOSIS — E063 Autoimmune thyroiditis: Secondary | ICD-10-CM | POA: Diagnosis not present

## 2019-01-12 MED ORDER — LEVOTHYROXINE SODIUM 125 MCG PO TABS: 125.0000 ug | ORAL_TABLET | Freq: Every day | ORAL | 1 refills | Status: DC

## 2019-01-12 MED FILL — SYNTHROID 125 MCG TABLET: 125 | 90 days supply | Qty: 90 | Fill #0
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
## 2019-01-19 DIAGNOSIS — H524 Presbyopia: Secondary | ICD-10-CM | POA: Diagnosis not present

## 2019-01-27 MED FILL — FUROSEMIDE 20 MG TABS: 20 | 90 days supply | Qty: 90 | Fill #0

## 2019-02-01 DIAGNOSIS — M25551 Pain in right hip: Secondary | ICD-10-CM | POA: Diagnosis not present

## 2019-02-02 DIAGNOSIS — M1611 Unilateral primary osteoarthritis, right hip: Secondary | ICD-10-CM | POA: Diagnosis not present

## 2019-02-04 DIAGNOSIS — J329 Chronic sinusitis, unspecified: Secondary | ICD-10-CM | POA: Diagnosis not present

## 2019-02-23 DIAGNOSIS — R399 Unspecified symptoms and signs involving the genitourinary system: Secondary | ICD-10-CM | POA: Diagnosis not present

## 2019-02-25 DIAGNOSIS — I10 Essential (primary) hypertension: Secondary | ICD-10-CM | POA: Diagnosis not present

## 2019-02-25 DIAGNOSIS — M4722 Other spondylosis with radiculopathy, cervical region: Secondary | ICD-10-CM | POA: Diagnosis not present

## 2019-02-25 DIAGNOSIS — Z6827 Body mass index (BMI) 27.0-27.9, adult: Secondary | ICD-10-CM | POA: Diagnosis not present

## 2019-03-11 MED FILL — valACYclovir HCL 1 GM TABS: 1 | 90 days supply | Qty: 45 | Fill #0

## 2019-03-11 MED FILL — MELOXICAM 15 MG TABLET: 15 | 90 days supply | Qty: 90 | Fill #0

## 2019-03-23 MED FILL — MELOXICAM 15 MG TABLET: 15 | 90 days supply | Qty: 90 | Fill #0

## 2019-03-23 MED FILL — valACYclovir HCL 1 GM TABS: 1 | 90 days supply | Qty: 45 | Fill #0

## 2019-04-05 DIAGNOSIS — I10 Essential (primary) hypertension: Secondary | ICD-10-CM | POA: Diagnosis not present

## 2019-04-05 DIAGNOSIS — J329 Chronic sinusitis, unspecified: Secondary | ICD-10-CM | POA: Diagnosis not present

## 2019-04-05 MED FILL — LIVALO 4 MG TABLET: 4 | 90 days supply | Qty: 90 | Fill #0

## 2019-04-13 MED FILL — PREMARIN VAGINAL CREAM-APPL: 0.625 | 90 days supply | Qty: 30 | Fill #1

## 2019-04-13 MED FILL — SYNTHROID 125 MCG TABLET: 125 | 90 days supply | Qty: 90 | Fill #1

## 2019-04-13 MED FILL — ESOMEPRAZOLE MAG DR 40 MG C: 40 | 90 days supply | Qty: 90 | Fill #1

## 2019-04-26 ENCOUNTER — Other Ambulatory Visit: Payer: Self-pay | Admitting: Obstetrics and Gynecology

## 2019-04-26 DIAGNOSIS — Z1231 Encounter for screening mammogram for malignant neoplasm of breast: Secondary | ICD-10-CM

## 2019-04-27 MED FILL — FUROSEMIDE 20 MG TABS: 20 | 90 days supply | Qty: 90 | Fill #0

## 2019-04-29 DIAGNOSIS — M1611 Unilateral primary osteoarthritis, right hip: Secondary | ICD-10-CM | POA: Diagnosis not present

## 2019-05-11 DIAGNOSIS — F419 Anxiety disorder, unspecified: Secondary | ICD-10-CM | POA: Diagnosis not present

## 2019-05-11 DIAGNOSIS — J329 Chronic sinusitis, unspecified: Secondary | ICD-10-CM | POA: Diagnosis not present

## 2019-05-23 DIAGNOSIS — M79671 Pain in right foot: Secondary | ICD-10-CM | POA: Diagnosis not present

## 2019-05-23 DIAGNOSIS — S93601A Unspecified sprain of right foot, initial encounter: Secondary | ICD-10-CM | POA: Diagnosis not present

## 2019-05-23 DIAGNOSIS — S99921A Unspecified injury of right foot, initial encounter: Secondary | ICD-10-CM | POA: Diagnosis not present

## 2019-05-23 DIAGNOSIS — Z9189 Other specified personal risk factors, not elsewhere classified: Secondary | ICD-10-CM | POA: Diagnosis not present

## 2019-05-23 DIAGNOSIS — R519 Headache, unspecified: Secondary | ICD-10-CM | POA: Diagnosis not present

## 2019-06-14 DIAGNOSIS — Z01419 Encounter for gynecological examination (general) (routine) without abnormal findings: Secondary | ICD-10-CM | POA: Diagnosis not present

## 2019-06-14 DIAGNOSIS — N760 Acute vaginitis: Secondary | ICD-10-CM | POA: Diagnosis not present

## 2019-06-14 DIAGNOSIS — Z1231 Encounter for screening mammogram for malignant neoplasm of breast: Secondary | ICD-10-CM | POA: Diagnosis not present

## 2019-06-14 DIAGNOSIS — Z6828 Body mass index (BMI) 28.0-28.9, adult: Secondary | ICD-10-CM | POA: Diagnosis not present

## 2019-06-14 MED FILL — MELOXICAM 15 MG TABLET: 15 | 90 days supply | Qty: 90 | Fill #0

## 2019-06-14 MED FILL — CLINDAMYCIN HCL 300 MG CAPS: 300 | 14 days supply | Qty: 56 | Fill #0

## 2019-06-14 MED FILL — ZOLPIDEM TARTRATE 10 MG TAB: 10 | 30 days supply | Qty: 30 | Fill #0

## 2019-06-14 MED FILL — valACYclovir HCL 1 GM TABS: 1 | 30 days supply | Qty: 30 | Fill #0

## 2019-06-16 ENCOUNTER — Ambulatory Visit: Payer: 59

## 2019-07-15 MED FILL — LOSARTAN-HCTZ 100-25 MG TAB: 100-25 | 90 days supply | Qty: 90 | Fill #0

## 2019-07-15 MED FILL — FUROSEMIDE 20 MG TABS: 20 | 90 days supply | Qty: 90 | Fill #1

## 2019-07-15 MED FILL — LIVALO 4 MG TABLET: 4 | 90 days supply | Qty: 90 | Fill #1

## 2019-07-18 ENCOUNTER — Ambulatory Visit: Payer: 59 | Admitting: "Endocrinology

## 2019-07-19 MED FILL — ZOLPIDEM TARTRATE 10 MG TAB: 10 | 30 days supply | Qty: 30 | Fill #1

## 2019-07-21 DIAGNOSIS — E039 Hypothyroidism, unspecified: Secondary | ICD-10-CM | POA: Diagnosis not present

## 2019-07-21 DIAGNOSIS — Z0000 Encounter for general adult medical examination without abnormal findings: Secondary | ICD-10-CM | POA: Diagnosis not present

## 2019-07-21 DIAGNOSIS — S9031XA Contusion of right foot, initial encounter: Secondary | ICD-10-CM | POA: Diagnosis not present

## 2019-07-21 DIAGNOSIS — G4700 Insomnia, unspecified: Secondary | ICD-10-CM | POA: Diagnosis not present

## 2019-07-21 DIAGNOSIS — J329 Chronic sinusitis, unspecified: Secondary | ICD-10-CM | POA: Diagnosis not present

## 2019-07-21 DIAGNOSIS — I10 Essential (primary) hypertension: Secondary | ICD-10-CM | POA: Diagnosis not present

## 2019-07-21 DIAGNOSIS — Z1211 Encounter for screening for malignant neoplasm of colon: Secondary | ICD-10-CM | POA: Diagnosis not present

## 2019-07-21 DIAGNOSIS — F419 Anxiety disorder, unspecified: Secondary | ICD-10-CM | POA: Diagnosis not present

## 2019-07-21 DIAGNOSIS — E785 Hyperlipidemia, unspecified: Secondary | ICD-10-CM | POA: Diagnosis not present

## 2019-07-25 MED FILL — SYNTHROID 125 MCG TABLET: 125 | 90 days supply | Qty: 90 | Fill #0

## 2019-08-26 DIAGNOSIS — M545 Low back pain: Secondary | ICD-10-CM | POA: Diagnosis not present

## 2019-08-26 DIAGNOSIS — M1611 Unilateral primary osteoarthritis, right hip: Secondary | ICD-10-CM | POA: Diagnosis not present

## 2019-08-26 MED FILL — METHYLPREDNISOLONE 4 MG TAB: 4 | 6 days supply | Qty: 21 | Fill #0

## 2019-09-14 ENCOUNTER — Encounter: Payer: Self-pay | Admitting: Family Medicine

## 2019-09-20 MED FILL — valACYclovir HCL 1 GM TABS: 1 | 30 days supply | Qty: 30 | Fill #1

## 2019-09-22 DIAGNOSIS — M25551 Pain in right hip: Secondary | ICD-10-CM | POA: Diagnosis not present

## 2019-09-22 DIAGNOSIS — M1611 Unilateral primary osteoarthritis, right hip: Secondary | ICD-10-CM | POA: Diagnosis not present

## 2019-09-22 DIAGNOSIS — M545 Low back pain: Secondary | ICD-10-CM | POA: Diagnosis not present

## 2019-09-29 DIAGNOSIS — K5730 Diverticulosis of large intestine without perforation or abscess without bleeding: Secondary | ICD-10-CM | POA: Diagnosis not present

## 2019-09-29 DIAGNOSIS — Z1211 Encounter for screening for malignant neoplasm of colon: Secondary | ICD-10-CM | POA: Diagnosis not present

## 2019-10-03 DIAGNOSIS — M25551 Pain in right hip: Secondary | ICD-10-CM | POA: Diagnosis not present

## 2019-10-03 DIAGNOSIS — M1611 Unilateral primary osteoarthritis, right hip: Secondary | ICD-10-CM | POA: Diagnosis not present

## 2019-10-25 ENCOUNTER — Other Ambulatory Visit (HOSPITAL_COMMUNITY): Payer: Self-pay | Admitting: Family Medicine

## 2019-10-26 ENCOUNTER — Other Ambulatory Visit (HOSPITAL_COMMUNITY): Payer: Self-pay | Admitting: Family Medicine

## 2019-11-02 MED FILL — valACYclovir HCL 1 GM TABS: 1 | 30 days supply | Qty: 30 | Fill #2

## 2020-01-13 DIAGNOSIS — M1611 Unilateral primary osteoarthritis, right hip: Secondary | ICD-10-CM | POA: Diagnosis not present

## 2020-01-17 MED FILL — valACYclovir HCL 1 GM TABS: 1 | 30 days supply | Qty: 30 | Fill #0

## 2020-01-17 MED FILL — FUROSEMIDE 20 MG TABS: 20 | 90 days supply | Qty: 90 | Fill #0

## 2020-01-17 MED FILL — LIVALO 4 MG TABLET: 4 | 90 days supply | Qty: 90 | Fill #0

## 2020-01-17 MED FILL — PREMARIN VAGINAL CREAM-APPL: 0.625 | 90 days supply | Qty: 30 | Fill #0

## 2020-01-31 MED FILL — FLUCONAZOLE 150 MG TABS: 150 | 1 days supply | Qty: 1 | Fill #0

## 2020-01-31 MED FILL — CEPHALEXIN 500 MG CAPSULE: 500 | 7 days supply | Qty: 14 | Fill #0

## 2020-03-06 DIAGNOSIS — H524 Presbyopia: Secondary | ICD-10-CM | POA: Diagnosis not present

## 2020-03-21 MED FILL — valACYclovir HCL 1 GM TABS: 1 | 30 days supply | Qty: 30 | Fill #1

## 2020-03-21 MED FILL — SYNTHROID 125 MCG TABLET: 125 | 90 days supply | Qty: 90 | Fill #0

## 2020-03-21 MED FILL — MELOXICAM 15 MG TABLET: 15 | 90 days supply | Qty: 90 | Fill #0

## 2020-03-22 DIAGNOSIS — X58XXXA Exposure to other specified factors, initial encounter: Secondary | ICD-10-CM | POA: Diagnosis not present

## 2020-03-22 DIAGNOSIS — I519 Heart disease, unspecified: Secondary | ICD-10-CM | POA: Diagnosis not present

## 2020-03-22 DIAGNOSIS — M25551 Pain in right hip: Secondary | ICD-10-CM | POA: Diagnosis not present

## 2020-03-22 DIAGNOSIS — I10 Essential (primary) hypertension: Secondary | ICD-10-CM | POA: Diagnosis not present

## 2020-03-22 DIAGNOSIS — E039 Hypothyroidism, unspecified: Secondary | ICD-10-CM | POA: Diagnosis not present

## 2020-03-22 DIAGNOSIS — E785 Hyperlipidemia, unspecified: Secondary | ICD-10-CM | POA: Diagnosis not present

## 2020-03-22 DIAGNOSIS — Z23 Encounter for immunization: Secondary | ICD-10-CM | POA: Diagnosis not present

## 2020-03-22 DIAGNOSIS — S0093XA Contusion of unspecified part of head, initial encounter: Secondary | ICD-10-CM | POA: Diagnosis not present

## 2020-03-22 DIAGNOSIS — S5011XA Contusion of right forearm, initial encounter: Secondary | ICD-10-CM | POA: Diagnosis not present

## 2020-04-16 DIAGNOSIS — M1611 Unilateral primary osteoarthritis, right hip: Secondary | ICD-10-CM | POA: Diagnosis not present

## 2020-04-16 DIAGNOSIS — M25551 Pain in right hip: Secondary | ICD-10-CM | POA: Diagnosis not present

## 2020-04-24 ENCOUNTER — Other Ambulatory Visit: Payer: Self-pay | Admitting: Radiology

## 2020-04-24 DIAGNOSIS — N644 Mastodynia: Secondary | ICD-10-CM

## 2020-04-24 DIAGNOSIS — R1011 Right upper quadrant pain: Secondary | ICD-10-CM | POA: Diagnosis not present

## 2020-04-24 DIAGNOSIS — N6459 Other signs and symptoms in breast: Secondary | ICD-10-CM | POA: Diagnosis not present

## 2020-04-24 DIAGNOSIS — N760 Acute vaginitis: Secondary | ICD-10-CM | POA: Diagnosis not present

## 2020-04-24 DIAGNOSIS — L989 Disorder of the skin and subcutaneous tissue, unspecified: Secondary | ICD-10-CM | POA: Diagnosis not present

## 2020-04-25 ENCOUNTER — Ambulatory Visit
Admission: RE | Admit: 2020-04-25 | Discharge: 2020-04-25 | Disposition: A | Discharge status: Home / Self Care | Payer: 59 | Source: Ambulatory Visit | Attending: Radiology | Admitting: Radiology

## 2020-04-25 ENCOUNTER — Other Ambulatory Visit: Payer: Self-pay

## 2020-04-25 ENCOUNTER — Ambulatory Visit: Payer: 59

## 2020-04-25 DIAGNOSIS — R928 Other abnormal and inconclusive findings on diagnostic imaging of breast: Secondary | ICD-10-CM | POA: Diagnosis not present

## 2020-04-25 DIAGNOSIS — N644 Mastodynia: Secondary | ICD-10-CM

## 2020-04-25 DIAGNOSIS — R1011 Right upper quadrant pain: Secondary | ICD-10-CM

## 2020-05-01 DIAGNOSIS — L570 Actinic keratosis: Secondary | ICD-10-CM | POA: Diagnosis not present

## 2020-05-01 DIAGNOSIS — D225 Melanocytic nevi of trunk: Secondary | ICD-10-CM | POA: Diagnosis not present

## 2020-05-01 DIAGNOSIS — L814 Other melanin hyperpigmentation: Secondary | ICD-10-CM | POA: Diagnosis not present

## 2020-05-01 DIAGNOSIS — D0461 Carcinoma in situ of skin of right upper limb, including shoulder: Secondary | ICD-10-CM | POA: Diagnosis not present

## 2020-05-01 DIAGNOSIS — L821 Other seborrheic keratosis: Secondary | ICD-10-CM | POA: Diagnosis not present

## 2020-05-01 DIAGNOSIS — D1801 Hemangioma of skin and subcutaneous tissue: Secondary | ICD-10-CM | POA: Diagnosis not present

## 2020-05-15 MED FILL — valACYclovir HCL 1 GM TABS: 1 | 30 days supply | Qty: 30 | Fill #2

## 2020-05-15 MED FILL — LIVALO 4 MG TABLET: 4 | 90 days supply | Qty: 90 | Fill #0

## 2020-05-15 MED FILL — FUROSEMIDE 20 MG TABS: 20 | 90 days supply | Qty: 90 | Fill #0

## 2020-05-23 ENCOUNTER — Other Ambulatory Visit: Payer: 59

## 2020-05-23 ENCOUNTER — Other Ambulatory Visit (HOSPITAL_COMMUNITY): Payer: Self-pay | Admitting: Surgical

## 2020-05-23 DIAGNOSIS — M25561 Pain in right knee: Secondary | ICD-10-CM | POA: Diagnosis not present

## 2020-05-23 DIAGNOSIS — M1611 Unilateral primary osteoarthritis, right hip: Secondary | ICD-10-CM | POA: Diagnosis not present

## 2020-05-23 MED FILL — HYDROCODON-APAP 5-325: 5-325 | 5 days supply | Qty: 20 | Fill #0

## 2020-06-18 DIAGNOSIS — M1611 Unilateral primary osteoarthritis, right hip: Secondary | ICD-10-CM | POA: Diagnosis present

## 2020-06-25 DIAGNOSIS — M25551 Pain in right hip: Secondary | ICD-10-CM | POA: Diagnosis not present

## 2020-06-25 NOTE — Progress Notes (Signed)
DUE TO COVID-19 ONLY ONE VISITOR IS ALLOWED TO COME WITH YOU AND STAY IN THE WAITING ROOM ONLY DURING PRE OP AND PROCEDURE DAY OF SURGERY. THE 1 VISITOR  MAY VISIT WITH YOU AFTER SURGERY IN YOUR PRIVATE ROOM DURING VISITING HOURS ONLY!  YOU NEED TO HAVE A COVID 19 TEST ON__1/14/2022 ___ @_______ , THIS TEST MUST BE DONE BEFORE SURGERY,  COVID TESTING SITE [address] [place] [address], IT IS ON THE RIGHT GOING OUT [address] APPROXIMATELY  2 MINUTES PAST [employer] ON THE RIGHT. ONCE YOUR COVID TEST IS COMPLETED,  PLEASE BEGIN THE QUARANTINE INSTRUCTIONS AS OUTLINED IN YOUR HANDOUT.                NAYDENE KAMROWSKI  06/25/2020   Your procedure is scheduled on: 07/03/2020    Report to [facility] Main  Entrance   Report to admitting at   [place] AM     Call this number if you have problems the morning of surgery [phone]    REMEMBER: NO  SOLID FOOD CANDY OR GUM AFTER MIDNIGHT. CLEAR LIQUIDS UNTIL 0745am         . NOTHING BY MOUTH EXCEPT CLEAR LIQUIDS UNTIL    . PLEASE FINISH ENSURE DRINK PER SURGEON ORDER  WHICH NEEDS TO BE COMPLETED AT   0745am   .      CLEAR LIQUID DIET   Foods Allowed                                                                    Coffee and tea, regular and decaf                            Fruit ices (not with fruit pulp)                                      Iced Popsicles                                    Carbonated beverages, regular and diet                                    Cranberry, grape and apple juices Sports drinks like Gatorade Lightly seasoned clear broth or consume(fat free) Sugar, honey syrup ___________________________________________________________________      BRUSH YOUR TEETH MORNING OF SURGERY AND RINSE YOUR MOUTH OUT, NO CHEWING GUM CANDY OR MINTS.     Take these medicines the morning of surgery with A SIP OF WATER: synthroid  DO NOT TAKE ANY DIABETIC MEDICATIONS DAY OF YOUR SURGERY                                You may not have any metal on your body including hair pins and              piercings  Do not wear jewelry, make-up,  lotions, powders or perfumes, deodorant             Do not wear nail polish on your fingernails.  Do not shave  48 hours prior to surgery.              Men may shave face and neck.   Do not bring valuables to the hospital. [place].  Contacts, dentures or bridgework may not be worn into surgery.  Leave suitcase in the car. After surgery it may be brought to your room.     Patients discharged the day of surgery will not be allowed to drive home. IF YOU ARE HAVING SURGERY AND GOING HOME THE SAME DAY, YOU MUST HAVE AN ADULT TO DRIVE YOU HOME AND BE WITH YOU FOR 24 HOURS. YOU MAY GO HOME BY TAXI OR UBER OR ORTHERWISE, BUT AN ADULT MUST ACCOMPANY YOU HOME AND STAY WITH YOU FOR 24 HOURS.  Name and phone number of your driver:  Special Instructions: N/A              Please read over the following fact sheets you were given: _____________________________________________________________________  [facility] - Preparing for Surgery Before surgery, you can play an important role.  Because skin is not sterile, your skin needs to be as free of germs as possible.  You can reduce the number of germs on your skin by washing with CHG (chlorahexidine gluconate) soap before surgery.  CHG is an antiseptic cleaner which kills germs and bonds with the skin to continue killing germs even after washing. Please DO NOT use if you have an allergy to CHG or antibacterial soaps.  If your skin becomes reddened/irritated stop using the CHG and inform your nurse when you arrive at Short Stay. Do not shave (including legs and underarms) for at least 48 hours prior to the first CHG shower.  You may shave your face/neck. Please follow these instructions carefully:  1.  Shower with CHG Soap the night before surgery and the  morning of  Surgery.  2.  If you choose to wash your hair, wash your hair first as usual with your  normal  shampoo.  3.  After you shampoo, rinse your hair and body thoroughly to remove the  shampoo.                           4.  Use CHG as you would any other liquid soap.  You can apply chg directly  to the skin and wash                       Gently with a scrungie or clean washcloth.  5.  Apply the CHG Soap to your body ONLY FROM THE NECK DOWN.   Do not use on face/ open                           Wound or open sores. Avoid contact with eyes, ears mouth and genitals (private parts).                       Wash face,  Genitals (private parts) with your normal soap.             6.  Wash thoroughly, paying special attention to  the area where your surgery  will be performed.  7.  Thoroughly rinse your body with warm water from the neck down.  8.  DO NOT shower/wash with your normal soap after using and rinsing off  the CHG Soap.                9.  Pat yourself dry with a clean towel.            10.  Wear clean pajamas.            11.  Place clean sheets on your bed the night of your first shower and do not  sleep with pets. Day of Surgery : Do not apply any lotions/deodorants the morning of surgery.  Please wear clean clothes to the hospital/surgery center.  FAILURE TO FOLLOW THESE INSTRUCTIONS MAY RESULT IN THE CANCELLATION OF YOUR SURGERY PATIENT SIGNATURE_________________________________  NURSE SIGNATURE__________________________________  ________________________________________________________________________

## 2020-06-26 ENCOUNTER — Other Ambulatory Visit: Payer: Self-pay

## 2020-06-26 ENCOUNTER — Encounter (HOSPITAL_COMMUNITY)
Admission: RE | Admit: 2020-06-26 | Discharge: 2020-06-26 | Disposition: A | Discharge status: Home / Self Care | Payer: 59 | Source: Ambulatory Visit | Attending: Orthopedic Surgery | Admitting: Orthopedic Surgery

## 2020-06-26 ENCOUNTER — Encounter (HOSPITAL_COMMUNITY): Payer: Self-pay

## 2020-06-26 DIAGNOSIS — Z20822 Contact with and (suspected) exposure to covid-19: Secondary | ICD-10-CM | POA: Diagnosis not present

## 2020-06-26 DIAGNOSIS — Z01818 Encounter for other preprocedural examination: Secondary | ICD-10-CM | POA: Diagnosis not present

## 2020-06-26 DIAGNOSIS — J069 Acute upper respiratory infection, unspecified: Secondary | ICD-10-CM | POA: Diagnosis not present

## 2020-06-26 HISTORY — DX: Depression, unspecified: F32.A

## 2020-06-26 HISTORY — DX: Headache, unspecified: R51.9

## 2020-06-26 LAB — BASIC METABOLIC PANEL
Anion gap: 9 (ref 5–15)
BUN: 17 mg/dL (ref 8–23)
CO2: 29 mmol/L (ref 22–32)
Calcium: 9.5 mg/dL (ref 8.9–10.3)
Chloride: 101 mmol/L (ref 98–111)
Creatinine, Ser: 0.84 mg/dL (ref 0.44–1.00)
GFR, Estimated: >60 mL/min (ref >60)
Glucose, Bld: 90 mg/dL (ref 70–99)
Potassium: 3.9 mmol/L (ref 3.5–5.1)
Sodium: 139 mmol/L (ref 135–145)

## 2020-06-26 LAB — CBC
HCT: 41.4 % (ref 36.0–46.0)
Hemoglobin: 13.4 g/dL (ref 12.0–15.0)
MCH: 30 pg (ref 26.0–34.0)
MCHC: 32.4 g/dL (ref 30.0–36.0)
MCV: 92.8 fL (ref 80.0–100.0)
Platelets: 342 10*3/uL (ref 150–400)
RBC: 4.46 MIL/uL (ref 3.87–5.11)
RDW: 13.6 % (ref 11.5–15.5)
WBC: 7 10*3/uL (ref 4.0–10.5)
nRBC: 0 % (ref 0.0–0.2)

## 2020-06-26 LAB — SURGICAL PCR SCREEN
MRSA, PCR: NEGATIVE
Staphylococcus aureus: NEGATIVE

## 2020-06-26 NOTE — Progress Notes (Addendum)
Anesthesia Review:  PCP: Pat Kocher , MD - clerance dated 06/14/20 on chart with office visit note  Cardiologist : not seen in years was followed by DR Einar Gip  Chest x-ray : EKG : have  Requested 12 lead ekg from DR [facility] office no EKG on file within year per office Last ekg 2019 per office. Ordered for day of surgery.   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

## 2020-06-29 ENCOUNTER — Other Ambulatory Visit (HOSPITAL_COMMUNITY)
Admission: RE | Admit: 2020-06-29 | Discharge: 2020-06-29 | Disposition: A | Discharge status: Home / Self Care | Payer: 59 | Source: Ambulatory Visit | Attending: Orthopedic Surgery | Admitting: Orthopedic Surgery

## 2020-06-29 DIAGNOSIS — Z20822 Contact with and (suspected) exposure to covid-19: Secondary | ICD-10-CM | POA: Diagnosis not present

## 2020-06-29 DIAGNOSIS — Z01812 Encounter for preprocedural laboratory examination: Secondary | ICD-10-CM | POA: Insufficient documentation

## 2020-06-29 LAB — SARS CORONAVIRUS 2 (TAT 6-24 HRS): SARS Coronavirus 2: NEGATIVE

## 2020-07-02 NOTE — Anesthesia Preprocedure Evaluation (Signed)
Anesthesia Evaluation  Patient identified by MRN, date of birth, ID band Patient awake    Reviewed: Allergy & Precautions, NPO status , Patient's Chart, lab work & pertinent test results  Airway Mallampati: I  TM Distance: >3 FB Neck ROM: Full    Dental no notable dental hx. (+) Teeth Intact, Dental Advisory Given   Pulmonary neg pulmonary ROS,    Pulmonary exam normal breath sounds clear to auscultation       Cardiovascular hypertension, Pt. on medications Normal cardiovascular exam Rhythm:Regular Rate:Normal     Neuro/Psych  Headaches, PSYCHIATRIC DISORDERS Anxiety Depression    GI/Hepatic Neg liver ROS, GERD  Medicated,  Endo/Other  Hypothyroidism   Renal/GU negative Renal ROS  negative genitourinary   Musculoskeletal  (+) Arthritis , Osteoarthritis,    Abdominal   Peds negative pediatric ROS (+)  Hematology negative hematology ROS (+)   Anesthesia Other Findings   Reproductive/Obstetrics negative OB ROS                            Anesthesia Physical  Anesthesia Plan  ASA: III  Anesthesia Plan: Spinal   Post-op Pain Management:    Induction:   PONV Risk Score and Plan: 3 and Ondansetron, Treatment may vary due to age or medical condition and Propofol infusion  Airway Management Planned: Simple Face Mask and Nasal Cannula  Additional Equipment: None  Intra-op Plan:   Post-operative Plan:   Informed Consent: I have reviewed the patients History and Physical, chart, labs and discussed the procedure including the risks, benefits and alternatives for the proposed anesthesia with the patient or authorized representative who has indicated his/her understanding and acceptance.     Dental advisory given  Plan Discussed with: CRNA and Anesthesiologist  Anesthesia Plan Comments: (See PAT note 10/21/18, Konrad Felix, PA-C)       Anesthesia Quick Evaluation

## 2020-07-03 ENCOUNTER — Ambulatory Visit (HOSPITAL_COMMUNITY): Payer: 59

## 2020-07-03 ENCOUNTER — Ambulatory Visit (HOSPITAL_COMMUNITY): Payer: 59 | Admitting: Certified Registered Nurse Anesthetist

## 2020-07-03 ENCOUNTER — Encounter (HOSPITAL_COMMUNITY): Payer: Self-pay | Admitting: Orthopedic Surgery

## 2020-07-03 ENCOUNTER — Ambulatory Visit (HOSPITAL_COMMUNITY): Payer: 59 | Admitting: Physician Assistant

## 2020-07-03 ENCOUNTER — Encounter (HOSPITAL_COMMUNITY)
Admission: RE | Disposition: A | Discharge status: Home / Self Care | Payer: Self-pay | Source: Home / Self Care | Attending: Orthopedic Surgery

## 2020-07-03 ENCOUNTER — Ambulatory Visit (HOSPITAL_COMMUNITY)
Admission: RE | Admit: 2020-07-03 | Discharge: 2020-07-03 | Disposition: A | Discharge status: Home / Self Care | Payer: 59 | Attending: Orthopedic Surgery | Admitting: Orthopedic Surgery

## 2020-07-03 DIAGNOSIS — Z87892 Personal history of anaphylaxis: Secondary | ICD-10-CM | POA: Diagnosis not present

## 2020-07-03 DIAGNOSIS — Z79890 Hormone replacement therapy (postmenopausal): Secondary | ICD-10-CM | POA: Insufficient documentation

## 2020-07-03 DIAGNOSIS — E039 Hypothyroidism, unspecified: Secondary | ICD-10-CM | POA: Diagnosis not present

## 2020-07-03 DIAGNOSIS — F418 Other specified anxiety disorders: Secondary | ICD-10-CM | POA: Diagnosis not present

## 2020-07-03 DIAGNOSIS — Z791 Long term (current) use of non-steroidal anti-inflammatories (NSAID): Secondary | ICD-10-CM | POA: Diagnosis not present

## 2020-07-03 DIAGNOSIS — Z471 Aftercare following joint replacement surgery: Secondary | ICD-10-CM | POA: Diagnosis not present

## 2020-07-03 DIAGNOSIS — Z79899 Other long term (current) drug therapy: Secondary | ICD-10-CM | POA: Insufficient documentation

## 2020-07-03 DIAGNOSIS — Z96641 Presence of right artificial hip joint: Secondary | ICD-10-CM | POA: Diagnosis not present

## 2020-07-03 DIAGNOSIS — Z7982 Long term (current) use of aspirin: Secondary | ICD-10-CM | POA: Insufficient documentation

## 2020-07-03 DIAGNOSIS — Z880 Allergy status to penicillin: Secondary | ICD-10-CM | POA: Insufficient documentation

## 2020-07-03 DIAGNOSIS — M1611 Unilateral primary osteoarthritis, right hip: Secondary | ICD-10-CM | POA: Diagnosis present

## 2020-07-03 DIAGNOSIS — I10 Essential (primary) hypertension: Secondary | ICD-10-CM | POA: Diagnosis not present

## 2020-07-03 HISTORY — PX: TOTAL HIP ARTHROPLASTY: SHX124

## 2020-07-03 LAB — TYPE AND SCREEN
ABO/RH(D): A POS
Antibody Screen: NEGATIVE

## 2020-07-03 LAB — ABO/RH: ABO/RH(D): A POS

## 2020-07-03 SURGERY — ARTHROPLASTY, HIP, TOTAL,POSTERIOR APPROACH
Anesthesia: Spinal | Site: Hip | Laterality: Right

## 2020-07-03 MED ORDER — ONDANSETRON HCL 4 MG/2ML IJ SOLN
INTRAMUSCULAR | Status: AC
  Filled 2020-07-03: qty 2

## 2020-07-03 MED ORDER — ONDANSETRON HCL 4 MG PO TABS: 4.0000 mg | ORAL_TABLET | Freq: Three times a day (TID) | ORAL | 0 refills | Status: DC | PRN

## 2020-07-03 MED ORDER — BUPIVACAINE IN DEXTROSE 0.75-8.25 % IT SOLN
INTRATHECAL | Status: DC | PRN
  Administered 2020-07-03: 1.6 mL via INTRATHECAL

## 2020-07-03 MED ORDER — OXYCODONE HCL 5 MG PO TABS
ORAL_TABLET | ORAL | Status: AC
  Administered 2020-07-03: 5 mg via ORAL
  Filled 2020-07-03: qty 1

## 2020-07-03 MED ORDER — BUPIVACAINE HCL (PF) 0.25 % IJ SOLN
INTRAMUSCULAR | Status: AC
  Filled 2020-07-03: qty 30

## 2020-07-03 MED ORDER — POVIDONE-IODINE 10 % EX SWAB
2.0000 "application " | Freq: Once | CUTANEOUS | Status: AC
  Administered 2020-07-03: 2 via TOPICAL

## 2020-07-03 MED ORDER — EPHEDRINE 5 MG/ML INJ
INTRAVENOUS | Status: AC
  Filled 2020-07-03: qty 10

## 2020-07-03 MED ORDER — 0.9 % SODIUM CHLORIDE (POUR BTL) OPTIME
TOPICAL | Status: DC | PRN
  Administered 2020-07-03: 1000 mL

## 2020-07-03 MED ORDER — KETOROLAC TROMETHAMINE 30 MG/ML IJ SOLN
INTRAMUSCULAR | Status: DC | PRN
  Administered 2020-07-03: 30 mg via INTRAMUSCULAR

## 2020-07-03 MED ORDER — LACTATED RINGERS IV SOLN: INTRAVENOUS | Status: DC

## 2020-07-03 MED ORDER — STERILE WATER FOR IRRIGATION IR SOLN
Status: DC | PRN
  Administered 2020-07-03: 2000 mL

## 2020-07-03 MED ORDER — OXYCODONE HCL 5 MG/5ML PO SOLN: 5.0000 mg | Freq: Once | ORAL | Status: AC | PRN

## 2020-07-03 MED ORDER — ACETAMINOPHEN 325 MG PO TABS: 325.0000 mg | ORAL_TABLET | ORAL | Status: DC | PRN

## 2020-07-03 MED ORDER — TRANEXAMIC ACID-NACL 1000-0.7 MG/100ML-% IV SOLN
1000.0000 mg | INTRAVENOUS | Status: AC
  Administered 2020-07-03: 1000 mg via INTRAVENOUS

## 2020-07-03 MED ORDER — EPHEDRINE SULFATE-NACL 50-0.9 MG/10ML-% IV SOSY
PREFILLED_SYRINGE | INTRAVENOUS | Status: DC | PRN
  Administered 2020-07-03 (×2): 5 mg via INTRAVENOUS

## 2020-07-03 MED ORDER — KETOROLAC TROMETHAMINE 30 MG/ML IJ SOLN
INTRAMUSCULAR | Status: AC
  Filled 2020-07-03: qty 1

## 2020-07-03 MED ORDER — FENTANYL CITRATE (PF) 100 MCG/2ML IJ SOLN: 25.0000 ug | INTRAMUSCULAR | Status: DC | PRN

## 2020-07-03 MED ORDER — ONDANSETRON HCL 4 MG/2ML IJ SOLN: 4.0000 mg | Freq: Once | INTRAMUSCULAR | Status: DC | PRN

## 2020-07-03 MED ORDER — HYDROCODONE-ACETAMINOPHEN 10-325 MG PO TABS: 1.0000 | ORAL_TABLET | Freq: Four times a day (QID) | ORAL | 0 refills | Status: DC | PRN

## 2020-07-03 MED ORDER — MEPERIDINE HCL 50 MG/ML IJ SOLN: 6.2500 mg | INTRAMUSCULAR | Status: DC | PRN

## 2020-07-03 MED ORDER — MIDAZOLAM HCL 2 MG/2ML IJ SOLN
INTRAMUSCULAR | Status: AC
  Filled 2020-07-03: qty 2

## 2020-07-03 MED ORDER — ORAL CARE MOUTH RINSE
15.0000 mL | Freq: Once | OROMUCOSAL | Status: AC
  Administered 2020-07-03: 15 mL via OROMUCOSAL

## 2020-07-03 MED ORDER — FENTANYL CITRATE (PF) 100 MCG/2ML IJ SOLN
INTRAMUSCULAR | Status: DC | PRN
  Administered 2020-07-03 (×2): 50 ug via INTRAVENOUS

## 2020-07-03 MED ORDER — FENTANYL CITRATE (PF) 100 MCG/2ML IJ SOLN
INTRAMUSCULAR | Status: AC
  Filled 2020-07-03: qty 2

## 2020-07-03 MED ORDER — ACETAMINOPHEN 500 MG PO TABS
ORAL_TABLET | ORAL | Status: AC
  Administered 2020-07-03: 1000 mg via ORAL
  Filled 2020-07-03: qty 2

## 2020-07-03 MED ORDER — PROPOFOL 10 MG/ML IV BOLUS
INTRAVENOUS | Status: AC
  Filled 2020-07-03: qty 20

## 2020-07-03 MED ORDER — MIDAZOLAM HCL 5 MG/5ML IJ SOLN
INTRAMUSCULAR | Status: DC | PRN
  Administered 2020-07-03: 2 mg via INTRAVENOUS

## 2020-07-03 MED ORDER — VANCOMYCIN HCL IN DEXTROSE 1-5 GM/200ML-% IV SOLN
INTRAVENOUS | Status: AC
  Administered 2020-07-03: 1000 mg via INTRAVENOUS
  Filled 2020-07-03: qty 200

## 2020-07-03 MED ORDER — ONDANSETRON HCL 4 MG/2ML IJ SOLN
INTRAMUSCULAR | Status: DC | PRN
  Administered 2020-07-03: 4 mg via INTRAVENOUS

## 2020-07-03 MED ORDER — PROPOFOL 500 MG/50ML IV EMUL
INTRAVENOUS | Status: DC | PRN
  Administered 2020-07-03: 75 ug/kg/min via INTRAVENOUS

## 2020-07-03 MED ORDER — ACETAMINOPHEN 500 MG PO TABS: 1000.0000 mg | ORAL_TABLET | Freq: Once | ORAL | Status: AC

## 2020-07-03 MED ORDER — CHLORHEXIDINE GLUCONATE 0.12 % MT SOLN: 15.0000 mL | Freq: Once | OROMUCOSAL | Status: AC

## 2020-07-03 MED ORDER — LACTATED RINGERS IV BOLUS
500.0000 mL | Freq: Once | INTRAVENOUS | Status: AC
  Administered 2020-07-03: 500 mL via INTRAVENOUS

## 2020-07-03 MED ORDER — PROPOFOL 10 MG/ML IV BOLUS
INTRAVENOUS | Status: DC | PRN
  Administered 2020-07-03 (×2): 20 mg via INTRAVENOUS

## 2020-07-03 MED ORDER — ACETAMINOPHEN 160 MG/5ML PO SOLN: 325.0000 mg | ORAL | Status: DC | PRN

## 2020-07-03 MED ORDER — PHENYLEPHRINE HCL-NACL 10-0.9 MG/250ML-% IV SOLN
INTRAVENOUS | Status: DC | PRN
  Administered 2020-07-03: 25 ug/min via INTRAVENOUS

## 2020-07-03 MED ORDER — PROPOFOL 1000 MG/100ML IV EMUL
INTRAVENOUS | Status: AC
  Filled 2020-07-03: qty 100

## 2020-07-03 MED ORDER — LACTATED RINGERS IV BOLUS: 250.0000 mL | Freq: Once | INTRAVENOUS | Status: DC

## 2020-07-03 MED ORDER — BUPIVACAINE HCL (PF) 0.25 % IJ SOLN
INTRAMUSCULAR | Status: DC | PRN
  Administered 2020-07-03: 30 mL

## 2020-07-03 MED ORDER — OXYCODONE HCL 5 MG PO TABS: 5.0000 mg | ORAL_TABLET | Freq: Once | ORAL | Status: AC | PRN

## 2020-07-03 MED ORDER — VANCOMYCIN HCL IN DEXTROSE 1-5 GM/200ML-% IV SOLN: 1000.0000 mg | INTRAVENOUS | Status: AC

## 2020-07-03 MED ORDER — TRANEXAMIC ACID-NACL 1000-0.7 MG/100ML-% IV SOLN
INTRAVENOUS | Status: AC
  Filled 2020-07-03: qty 100

## 2020-07-03 MED ORDER — ASPIRIN EC 325 MG PO TBEC: 325.0000 mg | DELAYED_RELEASE_TABLET | Freq: Two times a day (BID) | ORAL | 0 refills | Status: AC

## 2020-07-03 SURGICAL SUPPLY — 59 items
BIT DRILL 2.0X128 (BIT) ×2 IMPLANT
BLADE SAW SAG 73X25 THK (BLADE) ×1
BLADE SAW SGTL 73X25 THK (BLADE) ×1 IMPLANT
CLSR STERI-STRIP ANTIMIC 1/2X4 (GAUZE/BANDAGES/DRESSINGS) ×4 IMPLANT
COVER SURGICAL LIGHT HANDLE (MISCELLANEOUS) ×2 IMPLANT
COVER WAND RF STERILE (DRAPES) IMPLANT
DRAPE INCISE IOBAN 66X45 STRL (DRAPES) ×2 IMPLANT
DRAPE ORTHO SPLIT 77X108 STRL (DRAPES) ×4
DRAPE POUCH INSTRU U-SHP 10X18 (DRAPES) ×2 IMPLANT
DRAPE SHEET LG 3/4 BI-LAMINATE (DRAPES) ×2 IMPLANT
DRAPE SURG 17X11 SM STRL (DRAPES) ×2 IMPLANT
DRAPE SURG ORHT 6 SPLT 77X108 (DRAPES) ×2 IMPLANT
DRAPE U-SHAPE 47X51 STRL (DRAPES) ×2 IMPLANT
DRSG MEPILEX BORDER 4X12 (GAUZE/BANDAGES/DRESSINGS) ×1 IMPLANT
DRSG MEPILEX BORDER 4X8 (GAUZE/BANDAGES/DRESSINGS) ×2 IMPLANT
DURAPREP 26ML APPLICATOR (WOUND CARE) ×4 IMPLANT
ELECT BLADE TIP CTD 4 INCH (ELECTRODE) ×2 IMPLANT
ELECT REM PT RETURN 15FT ADLT (MISCELLANEOUS) ×2 IMPLANT
ELIMINATOR HOLE APEX DEPUY (Hips) ×1 IMPLANT
FACESHIELD WRAPAROUND (MASK) ×2 IMPLANT
FACESHIELD WRAPAROUND OR TEAM (MASK) ×1 IMPLANT
GLOVE BIO SURGEON STRL SZ7.5 (GLOVE) ×2 IMPLANT
GLOVE SRG 8 PF TXTR STRL LF DI (GLOVE) ×1 IMPLANT
GLOVE SURG ENC MOIS LTX SZ7 (GLOVE) ×2 IMPLANT
GLOVE SURG UNDER LTX SZ6.5 (GLOVE) ×2 IMPLANT
GLOVE SURG UNDER POLY LF SZ8 (GLOVE) ×2
GOWN STRL REUS W/TWL LRG LVL3 (GOWN DISPOSABLE) ×4 IMPLANT
HEAD CERAMIC DELTA 36 PLUS 1.5 (Hips) ×1 IMPLANT
HOOD PEEL AWAY FLYTE STAYCOOL (MISCELLANEOUS) ×6 IMPLANT
KIT BASIN OR (CUSTOM PROCEDURE TRAY) ×2 IMPLANT
KIT TURNOVER KIT A (KITS) ×2 IMPLANT
LINER NEUTRAL 52X36MM PLUS 4 (Liner) ×2 IMPLANT
MANIFOLD NEPTUNE II (INSTRUMENTS) ×2 IMPLANT
NDL MA TROC 1/2 (NEEDLE) IMPLANT
NDL SAFETY ECLIPSE 18X1.5 (NEEDLE) ×2 IMPLANT
NEEDLE HYPO 18GX1.5 SHARP (NEEDLE) ×4
NEEDLE MA TROC 1/2 (NEEDLE) IMPLANT
NS IRRIG 1000ML POUR BTL (IV SOLUTION) ×2 IMPLANT
PACK TOTAL JOINT (CUSTOM PROCEDURE TRAY) ×2 IMPLANT
PENCIL SMOKE EVACUATOR (MISCELLANEOUS) IMPLANT
PIN SECTOR W/GRIP ACE CUP 52MM (Hips) ×2 IMPLANT
PROTECTOR NERVE ULNAR (MISCELLANEOUS) ×2 IMPLANT
RETRIEVER SUT HEWSON (MISCELLANEOUS) ×2 IMPLANT
SCREW 6.5MMX25MM (Screw) ×1 IMPLANT
SCREW PINN CAN 6.5X20 (Screw) IMPLANT
SUCTION FRAZIER HANDLE 12FR (TUBING) ×2
SUCTION TUBE FRAZIER 12FR DISP (TUBING) ×1 IMPLANT
SUT FIBERWIRE #2 38 REV NDL BL (SUTURE) ×6
SUT VIC AB 0 CT1 36 (SUTURE) ×2 IMPLANT
SUT VIC AB 1 CT1 36 (SUTURE) ×4 IMPLANT
SUT VIC AB 2-0 CT1 27 (SUTURE) ×4
SUT VIC AB 2-0 CT1 TAPERPNT 27 (SUTURE) ×2 IMPLANT
SUT VIC AB 3-0 SH 8-18 (SUTURE) ×2 IMPLANT
SUTURE FIBERWR#2 38 REV NDL BL (SUTURE) ×3 IMPLANT
SYR CONTROL 10ML LL (SYRINGE) ×4 IMPLANT
TAP DUOFIX SZ5 STD OFF (Hips) ×1 IMPLANT
TOWEL OR 17X26 10 PK STRL BLUE (TOWEL DISPOSABLE) ×2 IMPLANT
TRAY FOLEY MTR SLVR 16FR STAT (SET/KITS/TRAYS/PACK) ×2 IMPLANT
WATER STERILE IRR 1000ML POUR (IV SOLUTION) ×4 IMPLANT

## 2020-07-03 NOTE — Op Note (Signed)
07/03/2020  9:50 AM  PATIENT:  Amber Werner   MRN: [id number]  PRE-OPERATIVE DIAGNOSIS: Right hip primary localized osteoarthritis  POST-OPERATIVE DIAGNOSIS:  same  PROCEDURE:  Procedure(s): TOTAL HIP ARTHROPLASTY  PREOPERATIVE INDICATIONS:    Amber Werner is an 65 y.o. female who has a diagnosis of right hip primary localized osteoarthritis and elected for surgical management after failing conservative treatment.  The risks benefits and alternatives were discussed with the patient including but not limited to the risks of nonoperative treatment, versus surgical intervention including infection, bleeding, nerve injury, periprosthetic fracture, the need for revision surgery, dislocation, leg length discrepancy, blood clots, cardiopulmonary complications, morbidity, mortality, among others, and they were willing to proceed.     OPERATIVE REPORT     SURGEON:  Marchia Bond, MD    ASSISTANT:  Merlene Pulling, PA-C, (Present throughout the entire procedure,  necessary for completion of procedure in a timely manner, assisting with retraction, instrumentation, and closure)     ANESTHESIA: Spinal  ESTIMATED BLOOD LOSS: 174 mL    COMPLICATIONS:  None.     UNIQUE ASPECTS OF THE CASE: I worked very hard to get the stem more lateral, hoping that I could get to a size 6, given that the size 5 on the other side may have been very slightly undersized, and may have had 1 mm of subsidence postoperatively, although I never really had a good comparison view to really tell.  Nonetheless I was hoping to get to a 6, but the 5 is extremely tight, and the 6 broach sat at least 2 teeth proud, and I was on the verge of fracturing the proximal femur, and ended up going with the 5 despite aggressive lateralization.  In fact I lateralized so much I got the lateralizing reamer stuck underneath the trochanter, and had to remove it using the manual T-handle, because I could not get it out from underneath the hood  of the troch.  I did medialize more with the cup on this first round in order to get a good fit, I had excellent press-fit fixation.  I started with a size 20 screw because I drilled up and got to the far cortex which was not as deep as I thought it would be, but the 20 screw was a spinner, and I went to a 25, it had slightly better purchase but still not amazing, however the overall purchase of the cup was excellent with the press-fit.  The final implant was extremely stable, both rotationally and to axial load, and had 1 tooth worth of beads showing, and I did do a calcar plane with the trial.  COMPONENTS:  [employer] Duofix Press fit femur size 5 standard with a 36 mm + 1.5 ceramic head ball and a Gription Acetabular shell size 52, with a single cancellous screw for backup fixation, with an apex hole eliminator and a +4 neutral polyethylene liner.    PROCEDURE IN DETAIL:   The patient was met in the holding area and  identified.  The appropriate hip was identified and marked at the operative site.  The patient was then transported to the OR  and  placed under anesthesia.  At that point, the patient was  placed in the lateral decubitus position with the operative side up and  secured to the operating room table and all bony prominences padded.     The operative lower extremity was prepped from the iliac crest to the distal leg.  Sterile draping was performed.  Time out was performed prior to incision.      A routine posterolateral approach was utilized via sharp dissection  carried down to the subcutaneous tissue.  Gross bleeders were Bovie coagulated.  The iliotibial band was identified and incised along the length of the skin incision.  Self-retaining retractors were  inserted.  With the hip internally rotated, the short external rotators  were identified. The piriformis and capsule was tagged with FiberWire, and the hip capsule released in a T-type fashion.  The femoral neck was exposed, and I  resected the femoral neck using the appropriate jig. This was performed at approximately a thumb's breadth above the lesser trochanter.    I then exposed the deep acetabulum, cleared out any tissue including the ligamentum teres.  A wing retractor was placed.  After adequate visualization, I excised the labrum, and then sequentially reamed.  I placed the trial acetabulum, which seated nicely, and then impacted the real cup into place.  Appropriate version and inclination was confirmed clinically matching their bony anatomy, and also with the use of the jig.  I placed a cancellous screw to augment fixation.  A trial polyethylene liner was placed and the wing retractor removed.    I then prepared the proximal femur using the cookie-cutter, the lateralizing reamer, and then sequentially reamed and broached.  The lateralization process was so aggressive that the lateralizing reamer got stuck, and it was challenging to get out as it was underneath the hood of the greater trochanter, I was able to remove it with a manual T-handle.  A trial broach, neck, and head was utilized, and I reduced the hip and it was found to have excellent stability with functional range of motion. The trial components were then removed, and the real polyethylene liner was placed.  I then impacted the real femoral prosthesis into place into the appropriate version, slightly anteverted to the normal anatomy, and I impacted the real head ball into place. The hip was then reduced and taken through functional range of motion and found to have excellent stability. Leg lengths were restored.  I then used a 2 mm drill bits to pass the FiberWire suture from the capsule and piriformis through the greater trochanter, and secured this. Excellent posterior capsular repair was achieved. I also closed the T in the capsule.  I then irrigated the hip copiously again with pulse lavage, and repaired the fascia with Vicryl, followed by Vicryl for the  subcutaneous tissue, Monocryl for the skin, Steri-Strips and sterile gauze. The wounds were injected. The patient was then awakened and returned to PACU in stable and satisfactory condition. There were no complications.  Marchia Bond, MD Orthopedic Surgeon [phone]   07/03/2020 9:50 AM

## 2020-07-03 NOTE — Discharge Instructions (Signed)
INSTRUCTIONS AFTER JOINT REPLACEMENT   o Remove items at home which could result in a fall. This includes throw rugs or furniture in walking pathways o ICE to the affected joint every three hours while awake for 30 minutes at a time, for at least the first 3-5 days, and then as needed for pain and swelling.  Continue to use ice for pain and swelling. You may notice swelling that will progress down to the foot and ankle.  This is normal after surgery.  Elevate your leg when you are not up walking on it.   o Continue to use the breathing machine you got in the hospital (incentive spirometer) which will help keep your temperature down.  It is common for your temperature to cycle up and down following surgery, especially at night when you are not up moving around and exerting yourself.  The breathing machine keeps your lungs expanded and your temperature down.   DIET:  As you were doing prior to hospitalization, we recommend a well-balanced diet.  DRESSING / WOUND CARE / SHOWERING  You may change your dressing 3-5 days after surgery.  Then change the dressing every day with sterile gauze.  Please use good hand washing techniques before changing the dressing.  Do not use any lotions or creams on the incision until instructed by your surgeon.  ACTIVITY  o Increase activity slowly as tolerated, but follow the weight bearing instructions below.   o No driving for 6 weeks or until further direction given by your physician.  You cannot drive while taking narcotics.  o No lifting or carrying greater than 10 lbs. until further directed by your surgeon. o Avoid periods of inactivity such as sitting longer than an hour when not asleep. This helps prevent blood clots.  o You may return to work once you are authorized by your doctor.     WEIGHT BEARING   Weight bearing as tolerated with assist device (walker, cane, etc) as directed, use it as long as suggested by your surgeon or therapist, typically at  least 4-6 weeks.   EXERCISES  Results after joint replacement surgery are often greatly improved when you follow the exercise, range of motion and muscle strengthening exercises prescribed by your doctor. Safety measures are also important to protect the joint from further injury. Any time any of these exercises cause you to have increased pain or swelling, decrease what you are doing until you are comfortable again and then slowly increase them. If you have problems or questions, call your caregiver or physical therapist for advice.   Rehabilitation is important following a joint replacement. After just a few days of immobilization, the muscles of the leg can become weakened and shrink (atrophy).  These exercises are designed to build up the tone and strength of the thigh and leg muscles and to improve motion. Often times heat used for twenty to thirty minutes before working out will loosen up your tissues and help with improving the range of motion but do not use heat for the first two weeks following surgery (sometimes heat can increase post-operative swelling).   These exercises can be done on a training (exercise) mat, on the floor, on a table or on a bed. Use whatever works the best and is most comfortable for you.    Use music or television while you are exercising so that the exercises are a pleasant break in your day. This will make your life better with the exercises acting as a break   in your routine that you can look forward to.   Perform all exercises about fifteen times, three times per day or as directed.  You should exercise both the operative leg and the other leg as well.  Exercises include:   . Quad Sets - Tighten up the muscle on the front of the thigh (Quad) and hold for 5-10 seconds.   . Straight Leg Raises - With your knee straight (if you were given a brace, keep it on), lift the leg to 60 degrees, hold for 3 seconds, and slowly lower the leg.  Perform this exercise against  resistance later as your leg gets stronger.  . Leg Slides: Lying on your back, slowly slide your foot toward your buttocks, bending your knee up off the floor (only go as far as is comfortable). Then slowly slide your foot back down until your leg is flat on the floor again.  . Angel Wings: Lying on your back spread your legs to the side as far apart as you can without causing discomfort.  . Hamstring Strength:  Lying on your back, push your heel against the floor with your leg straight by tightening up the muscles of your buttocks.  Repeat, but this time bend your knee to a comfortable angle, and push your heel against the floor.  You may put a pillow under the heel to make it more comfortable if necessary.   A rehabilitation program following joint replacement surgery can speed recovery and prevent re-injury in the future due to weakened muscles. Contact your doctor or a physical therapist for more information on knee rehabilitation.    CONSTIPATION  Constipation is defined medically as fewer than three stools per week and severe constipation as less than one stool per week.  Even if you have a regular bowel pattern at home, your normal regimen is likely to be disrupted due to multiple reasons following surgery.  Combination of anesthesia, postoperative narcotics, change in appetite and fluid intake all can affect your bowels.   YOU MUST use at least one of the following options; they are listed in order of increasing strength to get the job done.  They are all available over the counter, and you may need to use some, POSSIBLY even all of these options:    Drink plenty of fluids (prune juice may be helpful) and high fiber foods Colace 100 mg by mouth twice a day  Senokot for constipation as directed and as needed Dulcolax (bisacodyl), take with full glass of water  Miralax (polyethylene glycol) once or twice a day as needed.  If you have tried all these things and are unable to have a bowel  movement in the first 3-4 days after surgery call either your surgeon or your primary doctor.    If you experience loose stools or diarrhea, hold the medications until you stool forms back up.  If your symptoms do not get better within 1 week or if they get worse, check with your doctor.  If you experience "the worst abdominal pain ever" or develop nausea or vomiting, please contact the office immediately for further recommendations for treatment.   ITCHING:  If you experience itching with your medications, try taking only a single pain pill, or even half a pain pill at a time.  You can also use Benadryl over the counter for itching or also to help with sleep.   TED HOSE STOCKINGS:  Use stockings on both legs until for at least 2 weeks or as   directed by physician office. They may be removed at night for sleeping.  MEDICATIONS:  See your medication summary on the "After Visit Summary" that nursing will review with you.  You may have some home medications which will be placed on hold until you complete the course of blood thinner medication.  It is important for you to complete the blood thinner medication as prescribed.  PRECAUTIONS:  If you experience chest pain or shortness of breath - call 911 immediately for transfer to the hospital emergency department.   If you develop a fever greater that 101 F, purulent drainage from wound, increased redness or drainage from wound, foul odor from the wound/dressing, or calf pain - CONTACT YOUR SURGEON.                                                   FOLLOW-UP APPOINTMENTS:  If you do not already have a post-op appointment, please call the office for an appointment to be seen by your surgeon.  Guidelines for how soon to be seen are listed in your "After Visit Summary", but are typically between 1-4 weeks after surgery.  OTHER INSTRUCTIONS:    DENTAL ANTIBIOTICS:  In most cases prophylactic antibiotics for Dental procdeures after total joint surgery are  not necessary.  Exceptions are as follows:  1. History of prior total joint infection  2. Severely immunocompromised (Organ Transplant, cancer chemotherapy, Rheumatoid biologic meds such as Humera)  3. Poorly controlled diabetes (A1C &gt; 8.0, blood glucose over 200)  If you have one of these conditions, contact your surgeon for an antibiotic prescription, prior to your dental procedure.   MAKE SURE YOU:  . Understand these instructions.  . Get help right away if you are not doing well or get worse.    Thank you for letting us be a part of your medical care team.  It is a privilege we respect greatly.  We hope these instructions will help you stay on track for a fast and full recovery!   

## 2020-07-03 NOTE — Evaluation (Signed)
Physical Therapy Evaluation Patient Details Name: Amber Werner MRN: [id number] DOB: 01/06/1956 Today's Date: 07/03/2020   History of Present Illness  s/p R Posterior THA. PMH: L THA-posterior approach on 10/26/18  Clinical Impression  Pt is s/p THA resulting in the deficits listed below (see PT Problem List).  DeeDee is doing great today. See below for activity/mobility. Pt is pleasant and motivated to continue with HHPT.  She is ready to d/c with family assist prn, from PT standpoint. Pt will benefit from skilled PT to increase their independence and safety with mobility to allow discharge to the venue listed below.      Follow Up Recommendations Follow surgeon's recommendation for DC plan and follow-up therapies;Home health PT    Equipment Recommendations  None recommended by PT    Recommendations for Other Services       Precautions / Restrictions Precautions Precautions: Posterior Hip;Fall Restrictions Weight Bearing Restrictions: No Other Position/Activity Restrictions: WBAT      Mobility  Bed Mobility Overal bed mobility: Needs Assistance Bed Mobility: Supine to Sit;Sit to Supine     Supine to sit: Supervision Sit to supine: Supervision   General bed mobility comments: x2, supervision for safety    Transfers Overall transfer level: Needs assistance Equipment used: Rolling walker (2 wheeled) Transfers: Sit to/from Stand Sit to Stand: Min guard;Supervision         General transfer comment: cues for THP, hand placement. good carryover during session  Ambulation/Gait Ambulation/Gait assistance: Min guard;Supervision Gait Distance (Feet): 180 Feet (x2) Assistive device: Rolling walker (2 wheeled) Gait Pattern/deviations: Step-through pattern;Step-to pattern     General Gait Details: progression to step through without incr pain. steady with UE light support. discussed use of rollator/safety/possible decr pain control vs RW (pt plans to use rollator at  home)  Stairs Stairs: Yes Stairs assistance: Min guard Stair Management: Step to pattern;Two rails Number of Stairs: 5 (x2) General stair comments: cues for sequence  Wheelchair Mobility    Modified Rankin (Stroke Patients Only)       Balance Overall balance assessment: Mild deficits observed, not formally tested                                           Pertinent Vitals/Pain Pain Assessment: 0-10 Pain Score: 2  Pain Location: R hip Pain Descriptors / Indicators: Discomfort Pain Intervention(s): Limited activity within patient's tolerance;Monitored during session;Premedicated before session;Repositioned    Home Living Family/patient expects to be discharged to:: Private residence Living Arrangements: Spouse/significant other Available Help at Discharge: Family Type of Home: House Home Access: Stairs to enter   Entrance Stairs-Number of Steps: 1 Home Layout: One level Home Equipment: Walker - 2 wheels;Bedside commode    Pt is an pediatric RN at [employer]   Prior Function   IND prior to THA              Hand Dominance        Extremity/Trunk Assessment   Upper Extremity Assessment Upper Extremity Assessment: Overall WFL for tasks assessed    Lower Extremity Assessment Lower Extremity Assessment: RLE deficits/detail RLE Deficits / Details: grossly 2+ to 3/5. limitations d/t post op pain and weakness       Communication      Cognition Arousal/Alertness: Awake/alert Behavior During Therapy: WFL for tasks assessed/performed Overall Cognitive Status: Within Functional Limits for tasks assessed                                          General Comments      Exercises Total Joint Exercises Ankle Circles/Pumps: AROM;10 reps;Both Quad Sets: AROM;Both;5 reps Heel Slides: AROM;5 reps;Right Hip ABduction/ADduction: AROM;Right;5 reps   Assessment/Plan    PT Assessment All further PT needs can be met in the next venue of  care  PT Problem List         PT Treatment Interventions      PT Goals (Current goals can be found in the Care Plan section)  Acute Rehab PT Goals Patient Stated Goal: home today PT Goal Formulation: All assessment and education complete, DC therapy    Frequency     Barriers to discharge        Co-evaluation               AM-PAC PT "6 Clicks" Mobility  Outcome Measure Help needed turning from your back to your side while in a flat bed without using bedrails?: A Little Help needed moving from lying on your back to sitting on the side of a flat bed without using bedrails?: None Help needed moving to and from a bed to a chair (including a wheelchair)?: A Little Help needed standing up from a chair using your arms (e.g., wheelchair or bedside chair)?: A Little Help needed to walk in hospital room?: A Little Help needed climbing 3-5 steps with a railing? : A Little 6 Click Score: 19    End of Session Equipment Utilized During Treatment: Gait belt Activity Tolerance: Patient tolerated treatment well Patient left: in chair;with call bell/phone within reach;with nursing/sitter in room   PT Visit Diagnosis: Other abnormalities of gait and mobility (R26.89)    Time: 1332-1401 PT Time Calculation (min) (ACUTE ONLY): 29 min   Charges:   PT Evaluation $PT Eval Low Complexity: 1 Low PT Treatments $Gait Training: 8-22 mins        Baxter Flattery, PT  Acute Rehab Dept ([place]) [phone] Pager [phone]  07/03/2020   Kenyon Ana 07/03/2020, 2:26 PM

## 2020-07-03 NOTE — Transfer of Care (Signed)
Immediate Anesthesia Transfer of Care Note  Patient: Amber Werner  Procedure(s) Performed: TOTAL HIP ARTHROPLASTY (Right Hip)  Patient Location: PACU  Anesthesia Type:Spinal  Level of Consciousness: awake, alert  and oriented  Airway & Oxygen Therapy: Patient Spontanous Breathing and Patient connected to face mask oxygen  Post-op Assessment: Report given to RN and Post -op Vital signs reviewed and stable  Post vital signs: Reviewed and stable  Last Vitals:  Vitals Value Taken Time  BP 109/62 07/03/20 1024  Temp    Pulse 68 07/03/20 1026  Resp 14 07/03/20 1026  SpO2 100 % 07/03/20 1026  Vitals shown include unvalidated device data.  Last Pain:  Vitals:   07/03/20 0626  TempSrc: Oral         Complications: No complications documented.

## 2020-07-03 NOTE — Anesthesia Procedure Notes (Signed)
Procedure Name: MAC Date/Time: 07/03/2020 8:00 AM Performed by: Maxwell Caul, CRNA Pre-anesthesia Checklist: Patient identified, Emergency Drugs available, Suction available and Patient being monitored Oxygen Delivery Method: Simple face mask

## 2020-07-03 NOTE — Anesthesia Procedure Notes (Signed)
Spinal  Patient location during procedure: OR Start time: 07/03/2020 8:05 AM End time: 07/03/2020 8:10 AM Staffing Anesthesiologist: Janeece Riggers, MD Preanesthetic Checklist Completed: patient identified, IV checked, site marked, risks and benefits discussed, surgical consent, monitors and equipment checked, pre-op evaluation and timeout performed Spinal Block Patient position: sitting Prep: DuraPrep Patient monitoring: heart rate, cardiac monitor, continuous pulse ox and blood pressure Approach: midline Location: L3-4 Injection technique: single-shot Needle Needle type: Sprotte  Needle gauge: 24 G Needle length: 9 cm Assessment Sensory level: T4

## 2020-07-03 NOTE — Progress Notes (Signed)
After transition to phase 2, pt had increased bleeding on surgical dressing. This was an increase from previous blood markings. This nurse called Dr. Mardelle Matte who suggested we change the dressing sterilely. Update if needed.
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
## 2020-07-04 ENCOUNTER — Encounter (HOSPITAL_COMMUNITY): Payer: Self-pay | Admitting: Orthopedic Surgery

## 2020-07-04 NOTE — Anesthesia Postprocedure Evaluation (Signed)
Anesthesia Post Note  Patient: Amber Werner  Procedure(s) Performed: TOTAL HIP ARTHROPLASTY (Right Hip)     Patient location during evaluation: PACU Anesthesia Type: Spinal Level of consciousness: oriented and awake and alert Pain management: pain level controlled Vital Signs Assessment: post-procedure vital signs reviewed and stable Respiratory status: spontaneous breathing, respiratory function stable and patient connected to nasal cannula oxygen Cardiovascular status: blood pressure returned to baseline and stable Postop Assessment: no headache, no backache and no apparent nausea or vomiting Anesthetic complications: no   No complications documented.  Last Vitals:  Vitals:   07/03/20 1259 07/03/20 1415  BP: 125/80 (!) 149/83  Pulse: (!) 59 67  Resp: 14 14  Temp:  36.9 C  SpO2: 100% 100%    Last Pain:  Vitals:   07/03/20 1415  TempSrc: Axillary  PainSc: 4    Pain Goal:                   Neda Willenbring

## 2020-07-05 DIAGNOSIS — Z96641 Presence of right artificial hip joint: Secondary | ICD-10-CM | POA: Diagnosis not present

## 2020-07-05 DIAGNOSIS — M47812 Spondylosis without myelopathy or radiculopathy, cervical region: Secondary | ICD-10-CM | POA: Diagnosis not present

## 2020-07-05 DIAGNOSIS — I10 Essential (primary) hypertension: Secondary | ICD-10-CM | POA: Diagnosis not present

## 2020-07-05 DIAGNOSIS — M1611 Unilateral primary osteoarthritis, right hip: Secondary | ICD-10-CM | POA: Diagnosis not present

## 2020-07-05 DIAGNOSIS — Z471 Aftercare following joint replacement surgery: Secondary | ICD-10-CM | POA: Diagnosis not present

## 2020-07-05 DIAGNOSIS — E039 Hypothyroidism, unspecified: Secondary | ICD-10-CM | POA: Diagnosis not present

## 2020-07-05 DIAGNOSIS — G4700 Insomnia, unspecified: Secondary | ICD-10-CM | POA: Diagnosis not present

## 2020-07-05 DIAGNOSIS — F419 Anxiety disorder, unspecified: Secondary | ICD-10-CM | POA: Diagnosis not present

## 2020-07-05 DIAGNOSIS — Z981 Arthrodesis status: Secondary | ICD-10-CM | POA: Diagnosis not present

## 2020-07-09 DIAGNOSIS — Z96641 Presence of right artificial hip joint: Secondary | ICD-10-CM | POA: Diagnosis not present

## 2020-07-09 DIAGNOSIS — G4700 Insomnia, unspecified: Secondary | ICD-10-CM | POA: Diagnosis not present

## 2020-07-09 DIAGNOSIS — I10 Essential (primary) hypertension: Secondary | ICD-10-CM | POA: Diagnosis not present

## 2020-07-09 DIAGNOSIS — Z471 Aftercare following joint replacement surgery: Secondary | ICD-10-CM | POA: Diagnosis not present

## 2020-07-09 DIAGNOSIS — M1611 Unilateral primary osteoarthritis, right hip: Secondary | ICD-10-CM | POA: Diagnosis not present

## 2020-07-09 DIAGNOSIS — M47812 Spondylosis without myelopathy or radiculopathy, cervical region: Secondary | ICD-10-CM | POA: Diagnosis not present

## 2020-07-09 DIAGNOSIS — Z981 Arthrodesis status: Secondary | ICD-10-CM | POA: Diagnosis not present

## 2020-07-09 DIAGNOSIS — E039 Hypothyroidism, unspecified: Secondary | ICD-10-CM | POA: Diagnosis not present

## 2020-07-09 DIAGNOSIS — F419 Anxiety disorder, unspecified: Secondary | ICD-10-CM | POA: Diagnosis not present

## 2020-07-10 DIAGNOSIS — M47812 Spondylosis without myelopathy or radiculopathy, cervical region: Secondary | ICD-10-CM | POA: Diagnosis not present

## 2020-07-10 DIAGNOSIS — F419 Anxiety disorder, unspecified: Secondary | ICD-10-CM | POA: Diagnosis not present

## 2020-07-10 DIAGNOSIS — Z471 Aftercare following joint replacement surgery: Secondary | ICD-10-CM | POA: Diagnosis not present

## 2020-07-10 DIAGNOSIS — Z981 Arthrodesis status: Secondary | ICD-10-CM | POA: Diagnosis not present

## 2020-07-10 DIAGNOSIS — G4700 Insomnia, unspecified: Secondary | ICD-10-CM | POA: Diagnosis not present

## 2020-07-10 DIAGNOSIS — E039 Hypothyroidism, unspecified: Secondary | ICD-10-CM | POA: Diagnosis not present

## 2020-07-10 DIAGNOSIS — M1611 Unilateral primary osteoarthritis, right hip: Secondary | ICD-10-CM | POA: Diagnosis not present

## 2020-07-10 DIAGNOSIS — Z96641 Presence of right artificial hip joint: Secondary | ICD-10-CM | POA: Diagnosis not present

## 2020-07-10 DIAGNOSIS — I10 Essential (primary) hypertension: Secondary | ICD-10-CM | POA: Diagnosis not present

## 2020-07-11 DIAGNOSIS — M1611 Unilateral primary osteoarthritis, right hip: Secondary | ICD-10-CM | POA: Diagnosis not present

## 2020-07-12 DIAGNOSIS — G4700 Insomnia, unspecified: Secondary | ICD-10-CM | POA: Diagnosis not present

## 2020-07-12 DIAGNOSIS — M1611 Unilateral primary osteoarthritis, right hip: Secondary | ICD-10-CM | POA: Diagnosis not present

## 2020-07-12 DIAGNOSIS — Z981 Arthrodesis status: Secondary | ICD-10-CM | POA: Diagnosis not present

## 2020-07-12 DIAGNOSIS — I10 Essential (primary) hypertension: Secondary | ICD-10-CM | POA: Diagnosis not present

## 2020-07-12 DIAGNOSIS — E039 Hypothyroidism, unspecified: Secondary | ICD-10-CM | POA: Diagnosis not present

## 2020-07-12 DIAGNOSIS — Z96641 Presence of right artificial hip joint: Secondary | ICD-10-CM | POA: Diagnosis not present

## 2020-07-12 DIAGNOSIS — M47812 Spondylosis without myelopathy or radiculopathy, cervical region: Secondary | ICD-10-CM | POA: Diagnosis not present

## 2020-07-12 DIAGNOSIS — F419 Anxiety disorder, unspecified: Secondary | ICD-10-CM | POA: Diagnosis not present

## 2020-07-12 DIAGNOSIS — Z471 Aftercare following joint replacement surgery: Secondary | ICD-10-CM | POA: Diagnosis not present

## 2020-07-16 DIAGNOSIS — F419 Anxiety disorder, unspecified: Secondary | ICD-10-CM | POA: Diagnosis not present

## 2020-07-16 DIAGNOSIS — E039 Hypothyroidism, unspecified: Secondary | ICD-10-CM | POA: Diagnosis not present

## 2020-07-16 DIAGNOSIS — I10 Essential (primary) hypertension: Secondary | ICD-10-CM | POA: Diagnosis not present

## 2020-07-16 DIAGNOSIS — Z96641 Presence of right artificial hip joint: Secondary | ICD-10-CM | POA: Diagnosis not present

## 2020-07-16 DIAGNOSIS — Z981 Arthrodesis status: Secondary | ICD-10-CM | POA: Diagnosis not present

## 2020-07-16 DIAGNOSIS — Z471 Aftercare following joint replacement surgery: Secondary | ICD-10-CM | POA: Diagnosis not present

## 2020-07-16 DIAGNOSIS — M47812 Spondylosis without myelopathy or radiculopathy, cervical region: Secondary | ICD-10-CM | POA: Diagnosis not present

## 2020-07-16 DIAGNOSIS — M1611 Unilateral primary osteoarthritis, right hip: Secondary | ICD-10-CM | POA: Diagnosis not present

## 2020-07-16 DIAGNOSIS — G4700 Insomnia, unspecified: Secondary | ICD-10-CM | POA: Diagnosis not present

## 2020-07-18 DIAGNOSIS — I10 Essential (primary) hypertension: Secondary | ICD-10-CM | POA: Diagnosis not present

## 2020-07-18 DIAGNOSIS — G4700 Insomnia, unspecified: Secondary | ICD-10-CM | POA: Diagnosis not present

## 2020-07-18 DIAGNOSIS — Z981 Arthrodesis status: Secondary | ICD-10-CM | POA: Diagnosis not present

## 2020-07-18 DIAGNOSIS — Z471 Aftercare following joint replacement surgery: Secondary | ICD-10-CM | POA: Diagnosis not present

## 2020-07-18 DIAGNOSIS — E039 Hypothyroidism, unspecified: Secondary | ICD-10-CM | POA: Diagnosis not present

## 2020-07-18 DIAGNOSIS — Z96641 Presence of right artificial hip joint: Secondary | ICD-10-CM | POA: Diagnosis not present

## 2020-07-18 DIAGNOSIS — F419 Anxiety disorder, unspecified: Secondary | ICD-10-CM | POA: Diagnosis not present

## 2020-07-18 DIAGNOSIS — M47812 Spondylosis without myelopathy or radiculopathy, cervical region: Secondary | ICD-10-CM | POA: Diagnosis not present

## 2020-07-18 DIAGNOSIS — M1611 Unilateral primary osteoarthritis, right hip: Secondary | ICD-10-CM | POA: Diagnosis not present

## 2020-07-20 ENCOUNTER — Other Ambulatory Visit (HOSPITAL_COMMUNITY): Payer: Self-pay | Admitting: Radiology

## 2020-07-20 DIAGNOSIS — Z471 Aftercare following joint replacement surgery: Secondary | ICD-10-CM | POA: Diagnosis not present

## 2020-07-20 DIAGNOSIS — I10 Essential (primary) hypertension: Secondary | ICD-10-CM | POA: Diagnosis not present

## 2020-07-20 DIAGNOSIS — M1611 Unilateral primary osteoarthritis, right hip: Secondary | ICD-10-CM | POA: Diagnosis not present

## 2020-07-20 DIAGNOSIS — M47812 Spondylosis without myelopathy or radiculopathy, cervical region: Secondary | ICD-10-CM | POA: Diagnosis not present

## 2020-07-20 DIAGNOSIS — E039 Hypothyroidism, unspecified: Secondary | ICD-10-CM | POA: Diagnosis not present

## 2020-07-20 DIAGNOSIS — G4700 Insomnia, unspecified: Secondary | ICD-10-CM | POA: Diagnosis not present

## 2020-07-20 DIAGNOSIS — F419 Anxiety disorder, unspecified: Secondary | ICD-10-CM | POA: Diagnosis not present

## 2020-07-20 DIAGNOSIS — Z981 Arthrodesis status: Secondary | ICD-10-CM | POA: Diagnosis not present

## 2020-07-20 DIAGNOSIS — Z96641 Presence of right artificial hip joint: Secondary | ICD-10-CM | POA: Diagnosis not present

## 2020-07-20 MED FILL — SYNTHROID 125 MCG TABLET: 125 | 90 days supply | Qty: 90 | Fill #0

## 2020-07-20 MED FILL — valACYclovir HCL 1 GM TABS: 1 | 30 days supply | Qty: 30 | Fill #0

## 2020-07-20 MED FILL — LOSARTAN-HCTZ 100-25 MG TAB: 100-25 | 30 days supply | Qty: 30 | Fill #0

## 2020-07-23 DIAGNOSIS — Z981 Arthrodesis status: Secondary | ICD-10-CM | POA: Diagnosis not present

## 2020-07-23 DIAGNOSIS — I10 Essential (primary) hypertension: Secondary | ICD-10-CM | POA: Diagnosis not present

## 2020-07-23 DIAGNOSIS — F419 Anxiety disorder, unspecified: Secondary | ICD-10-CM | POA: Diagnosis not present

## 2020-07-23 DIAGNOSIS — G4700 Insomnia, unspecified: Secondary | ICD-10-CM | POA: Diagnosis not present

## 2020-07-23 DIAGNOSIS — M47812 Spondylosis without myelopathy or radiculopathy, cervical region: Secondary | ICD-10-CM | POA: Diagnosis not present

## 2020-07-23 DIAGNOSIS — Z471 Aftercare following joint replacement surgery: Secondary | ICD-10-CM | POA: Diagnosis not present

## 2020-07-23 DIAGNOSIS — M1611 Unilateral primary osteoarthritis, right hip: Secondary | ICD-10-CM | POA: Diagnosis not present

## 2020-07-23 DIAGNOSIS — E039 Hypothyroidism, unspecified: Secondary | ICD-10-CM | POA: Diagnosis not present

## 2020-07-23 DIAGNOSIS — Z96641 Presence of right artificial hip joint: Secondary | ICD-10-CM | POA: Diagnosis not present

## 2020-07-25 DIAGNOSIS — F419 Anxiety disorder, unspecified: Secondary | ICD-10-CM | POA: Diagnosis not present

## 2020-07-25 DIAGNOSIS — E039 Hypothyroidism, unspecified: Secondary | ICD-10-CM | POA: Diagnosis not present

## 2020-07-25 DIAGNOSIS — I10 Essential (primary) hypertension: Secondary | ICD-10-CM | POA: Diagnosis not present

## 2020-07-25 DIAGNOSIS — Z471 Aftercare following joint replacement surgery: Secondary | ICD-10-CM | POA: Diagnosis not present

## 2020-07-25 DIAGNOSIS — Z96641 Presence of right artificial hip joint: Secondary | ICD-10-CM | POA: Diagnosis not present

## 2020-07-25 DIAGNOSIS — M47812 Spondylosis without myelopathy or radiculopathy, cervical region: Secondary | ICD-10-CM | POA: Diagnosis not present

## 2020-07-25 DIAGNOSIS — Z981 Arthrodesis status: Secondary | ICD-10-CM | POA: Diagnosis not present

## 2020-07-25 DIAGNOSIS — G4700 Insomnia, unspecified: Secondary | ICD-10-CM | POA: Diagnosis not present

## 2020-07-25 DIAGNOSIS — M1611 Unilateral primary osteoarthritis, right hip: Secondary | ICD-10-CM | POA: Diagnosis not present

## 2020-07-26 MED FILL — LIVALO 4 MG TABLET: 4 | 90 days supply | Qty: 90 | Fill #0

## 2020-07-26 MED FILL — FUROSEMIDE 20 MG TABS: 20 | 90 days supply | Qty: 90 | Fill #0

## 2020-07-27 DIAGNOSIS — F419 Anxiety disorder, unspecified: Secondary | ICD-10-CM | POA: Diagnosis not present

## 2020-07-27 DIAGNOSIS — Z96641 Presence of right artificial hip joint: Secondary | ICD-10-CM | POA: Diagnosis not present

## 2020-07-27 DIAGNOSIS — M1611 Unilateral primary osteoarthritis, right hip: Secondary | ICD-10-CM | POA: Diagnosis not present

## 2020-07-27 DIAGNOSIS — I10 Essential (primary) hypertension: Secondary | ICD-10-CM | POA: Diagnosis not present

## 2020-07-27 DIAGNOSIS — E039 Hypothyroidism, unspecified: Secondary | ICD-10-CM | POA: Diagnosis not present

## 2020-07-27 DIAGNOSIS — G4700 Insomnia, unspecified: Secondary | ICD-10-CM | POA: Diagnosis not present

## 2020-07-27 DIAGNOSIS — M47812 Spondylosis without myelopathy or radiculopathy, cervical region: Secondary | ICD-10-CM | POA: Diagnosis not present

## 2020-07-27 DIAGNOSIS — Z471 Aftercare following joint replacement surgery: Secondary | ICD-10-CM | POA: Diagnosis not present

## 2020-07-27 DIAGNOSIS — Z981 Arthrodesis status: Secondary | ICD-10-CM | POA: Diagnosis not present

## 2020-07-30 DIAGNOSIS — Z96641 Presence of right artificial hip joint: Secondary | ICD-10-CM | POA: Diagnosis not present

## 2020-07-30 DIAGNOSIS — M25551 Pain in right hip: Secondary | ICD-10-CM | POA: Diagnosis not present

## 2020-08-07 DIAGNOSIS — M25551 Pain in right hip: Secondary | ICD-10-CM | POA: Diagnosis not present

## 2020-08-07 DIAGNOSIS — Z96641 Presence of right artificial hip joint: Secondary | ICD-10-CM | POA: Diagnosis not present

## 2020-08-08 DIAGNOSIS — M1611 Unilateral primary osteoarthritis, right hip: Secondary | ICD-10-CM | POA: Diagnosis not present

## 2020-08-14 DIAGNOSIS — Z96641 Presence of right artificial hip joint: Secondary | ICD-10-CM | POA: Diagnosis not present

## 2020-08-14 DIAGNOSIS — M25551 Pain in right hip: Secondary | ICD-10-CM | POA: Diagnosis not present

## 2020-08-16 DIAGNOSIS — M25551 Pain in right hip: Secondary | ICD-10-CM | POA: Diagnosis not present

## 2020-08-16 DIAGNOSIS — J329 Chronic sinusitis, unspecified: Secondary | ICD-10-CM | POA: Diagnosis not present

## 2020-08-16 DIAGNOSIS — Z96641 Presence of right artificial hip joint: Secondary | ICD-10-CM | POA: Diagnosis not present

## 2020-08-20 DIAGNOSIS — M25551 Pain in right hip: Secondary | ICD-10-CM | POA: Diagnosis not present

## 2020-08-20 DIAGNOSIS — Z96641 Presence of right artificial hip joint: Secondary | ICD-10-CM | POA: Diagnosis not present

## 2020-08-23 ENCOUNTER — Other Ambulatory Visit (HOSPITAL_COMMUNITY): Payer: Self-pay | Admitting: Radiology

## 2020-08-23 MED FILL — PREMARIN VAGINAL CREAM-APPL: 0.625 | 90 days supply | Qty: 30 | Fill #0

## 2020-08-28 DIAGNOSIS — Z96641 Presence of right artificial hip joint: Secondary | ICD-10-CM | POA: Diagnosis not present

## 2020-08-28 DIAGNOSIS — M25551 Pain in right hip: Secondary | ICD-10-CM | POA: Diagnosis not present

## 2020-09-04 ENCOUNTER — Other Ambulatory Visit (HOSPITAL_BASED_OUTPATIENT_CLINIC_OR_DEPARTMENT_OTHER): Payer: Self-pay

## 2020-09-07 DIAGNOSIS — M25551 Pain in right hip: Secondary | ICD-10-CM | POA: Diagnosis not present

## 2020-09-10 DIAGNOSIS — M25551 Pain in right hip: Secondary | ICD-10-CM | POA: Diagnosis not present

## 2020-09-10 DIAGNOSIS — Z96641 Presence of right artificial hip joint: Secondary | ICD-10-CM | POA: Diagnosis not present

## 2020-09-11 ENCOUNTER — Other Ambulatory Visit (HOSPITAL_COMMUNITY): Payer: Self-pay | Admitting: Family Medicine

## 2020-09-11 DIAGNOSIS — R739 Hyperglycemia, unspecified: Secondary | ICD-10-CM | POA: Diagnosis not present

## 2020-09-11 DIAGNOSIS — E785 Hyperlipidemia, unspecified: Secondary | ICD-10-CM | POA: Diagnosis not present

## 2020-09-11 DIAGNOSIS — I10 Essential (primary) hypertension: Secondary | ICD-10-CM | POA: Diagnosis not present

## 2020-09-11 DIAGNOSIS — M25552 Pain in left hip: Secondary | ICD-10-CM | POA: Diagnosis not present

## 2020-09-11 DIAGNOSIS — E039 Hypothyroidism, unspecified: Secondary | ICD-10-CM | POA: Diagnosis not present

## 2020-09-11 DIAGNOSIS — B009 Herpesviral infection, unspecified: Secondary | ICD-10-CM | POA: Diagnosis not present

## 2020-09-11 MED FILL — LOSARTAN-HCTZ 100-25 MG TAB: 100-25 | 30 days supply | Qty: 30 | Fill #0

## 2020-09-11 MED FILL — MELOXICAM 15 MG TABLET: 15 | 90 days supply | Qty: 90 | Fill #0

## 2020-09-13 DIAGNOSIS — Z96641 Presence of right artificial hip joint: Secondary | ICD-10-CM | POA: Diagnosis not present

## 2020-09-13 DIAGNOSIS — M25551 Pain in right hip: Secondary | ICD-10-CM | POA: Diagnosis not present

## 2020-09-20 DIAGNOSIS — M25551 Pain in right hip: Secondary | ICD-10-CM | POA: Diagnosis not present

## 2020-09-20 DIAGNOSIS — Z96641 Presence of right artificial hip joint: Secondary | ICD-10-CM | POA: Diagnosis not present

## 2020-09-24 DIAGNOSIS — Z96641 Presence of right artificial hip joint: Secondary | ICD-10-CM | POA: Diagnosis not present

## 2020-09-24 DIAGNOSIS — M25551 Pain in right hip: Secondary | ICD-10-CM | POA: Diagnosis not present

## 2020-10-08 ENCOUNTER — Other Ambulatory Visit (HOSPITAL_COMMUNITY): Payer: Self-pay

## 2020-10-08 DIAGNOSIS — U071 COVID-19: Secondary | ICD-10-CM | POA: Diagnosis not present

## 2020-10-08 MED FILL — Levothyroxine Sodium Tab 125 MCG: ORAL | 90 days supply | Qty: 90 | Fill #0 | Status: AC

## 2020-10-08 MED FILL — Valacyclovir HCl Tab 1 GM: ORAL | 30 days supply | Qty: 30 | Fill #0 | Status: AC

## 2020-10-08 MED FILL — Furosemide Tab 20 MG: ORAL | 90 days supply | Qty: 90 | Fill #0 | Status: AC

## 2020-10-16 ENCOUNTER — Other Ambulatory Visit (HOSPITAL_COMMUNITY): Payer: Self-pay

## 2020-10-16 DIAGNOSIS — Z6828 Body mass index (BMI) 28.0-28.9, adult: Secondary | ICD-10-CM | POA: Diagnosis not present

## 2020-10-16 DIAGNOSIS — Z01419 Encounter for gynecological examination (general) (routine) without abnormal findings: Secondary | ICD-10-CM | POA: Diagnosis not present

## 2020-10-16 MED ORDER — CEPHALEXIN 500 MG PO CAPS
ORAL_CAPSULE | ORAL | 0 refills | Status: DC
  Filled 2020-10-16: qty 28, 7d supply, fill #0

## 2020-11-01 DIAGNOSIS — Z760 Encounter for issue of repeat prescription: Secondary | ICD-10-CM | POA: Diagnosis not present

## 2020-12-10 ENCOUNTER — Other Ambulatory Visit (HOSPITAL_COMMUNITY): Payer: Self-pay

## 2020-12-10 MED FILL — Pitavastatin Calcium Tab 4 MG: ORAL | 90 days supply | Qty: 90 | Fill #0 | Status: AC

## 2020-12-10 MED FILL — Losartan Potassium & Hydrochlorothiazide Tab 100-25 MG: ORAL | 90 days supply | Qty: 90 | Fill #0 | Status: AC

## 2020-12-10 MED FILL — Valacyclovir HCl Tab 500 MG: ORAL | 90 days supply | Qty: 90 | Fill #0 | Status: AC

## 2021-02-02 ENCOUNTER — Other Ambulatory Visit (HOSPITAL_COMMUNITY): Payer: Self-pay

## 2021-02-02 MED FILL — Meloxicam Tab 15 MG: ORAL | 90 days supply | Qty: 90 | Fill #0 | Status: AC

## 2021-02-02 MED FILL — Levothyroxine Sodium Tab 125 MCG: ORAL | 90 days supply | Qty: 90 | Fill #1 | Status: AC

## 2021-02-04 ENCOUNTER — Other Ambulatory Visit (HOSPITAL_COMMUNITY): Payer: Self-pay

## 2021-02-04 MED FILL — Estrogens, Conjugated Vaginal Cream 0.625 MG/GM: VAGINAL | 90 days supply | Qty: 30 | Fill #0 | Status: AC

## 2021-02-04 MED FILL — Furosemide Tab 20 MG: ORAL | 90 days supply | Qty: 90 | Fill #1 | Status: AC

## 2021-03-14 IMAGING — CT CT HEAD WITHOUT AND WITH CONTRAST
3 of 4 series · 15 of 47 positions shown, 18 images · IV contrast (omnipaque)
Comparison: 02/06/2012

CLINICAL DATA: Headache and dizziness

EXAM:
CT HEAD WITHOUT AND WITH CONTRAST
TECHNIQUE: Contiguous axial images were obtained from the base of the skull
through the vertex without and with intravenous contrast
CONTRAST:  100mL OMNIPAQUE IOHEXOL 300 MG/ML  SOLN

[Series 3: head wo · axial · 0.47mm/px · z∈[-85,+35]mm · 9 of 29 slices shown, 12 images]
[im 3/29  brain]
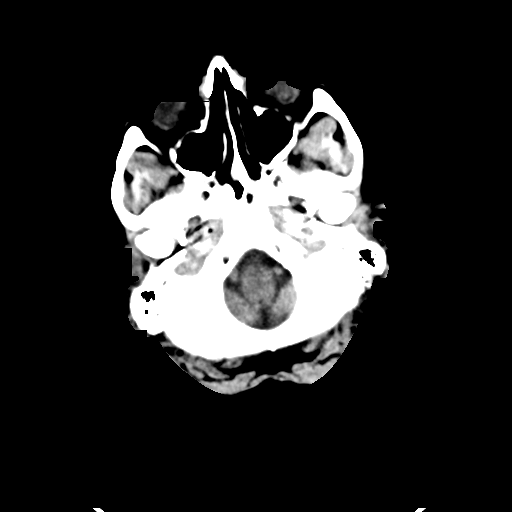
[im 3/29  bone]
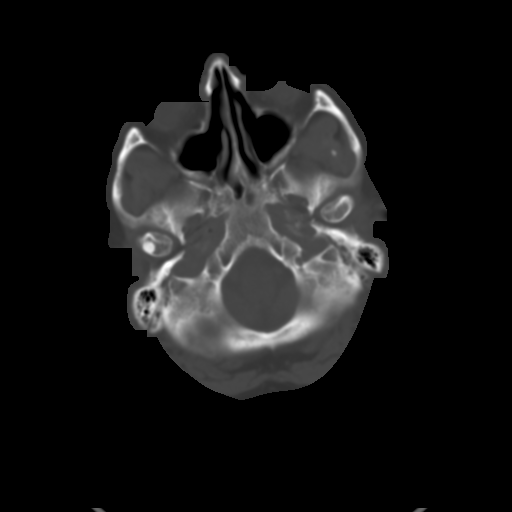
[im 7/29  brain]
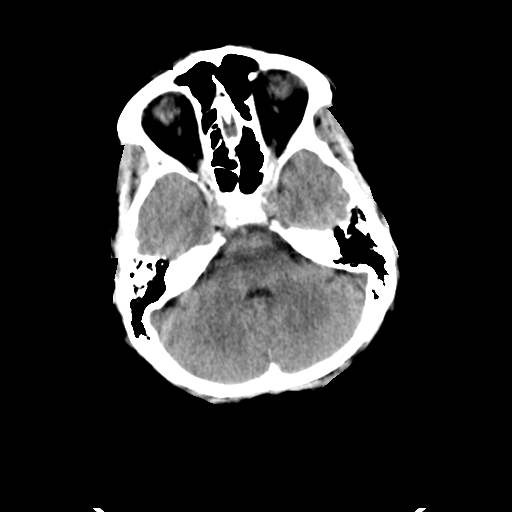
[im 9/29  brain]
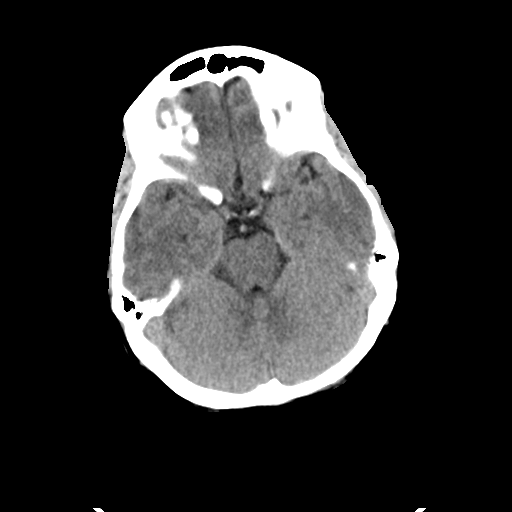
[im 13/29  brain]
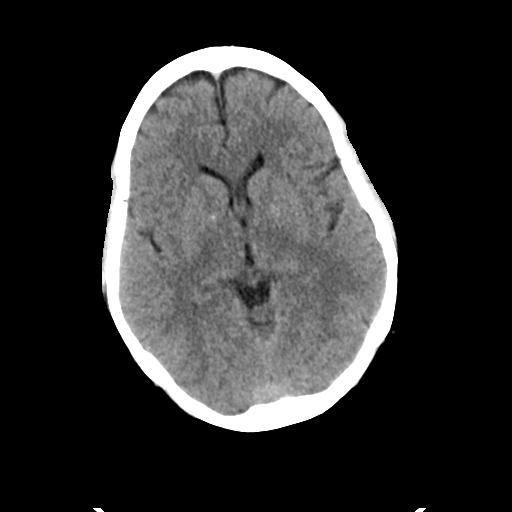
[im 15/29  brain]
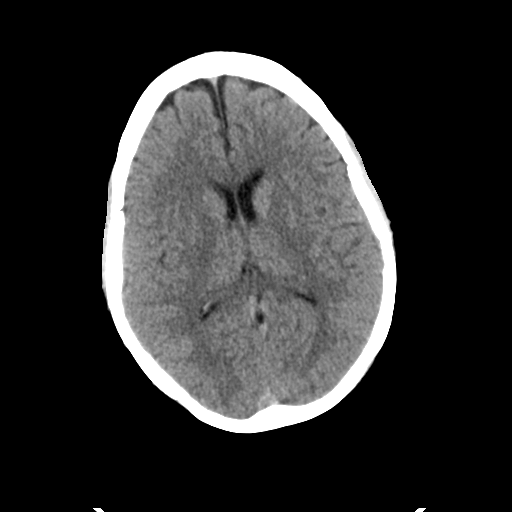
[im 15/29  bone]
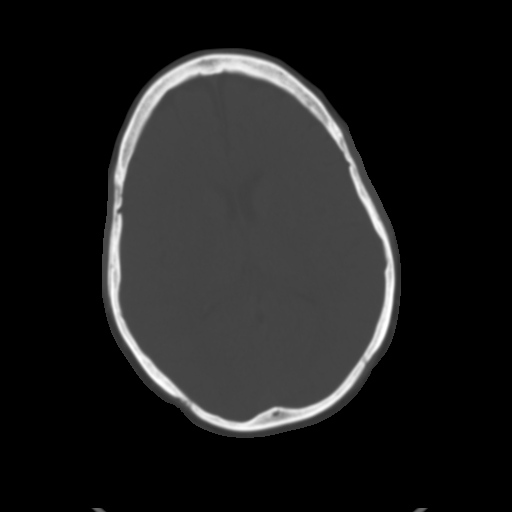
[im 17/29  brain]
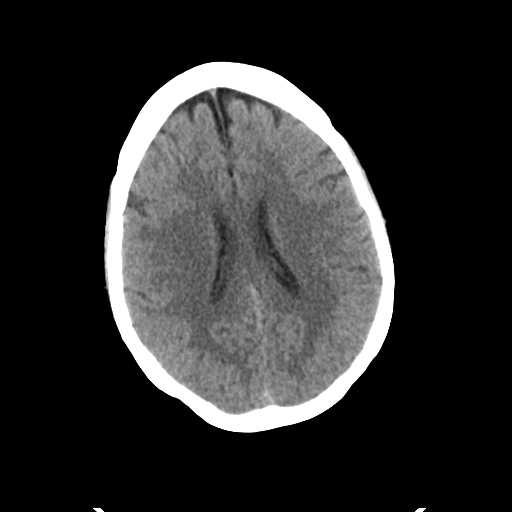
[im 21/29  brain]
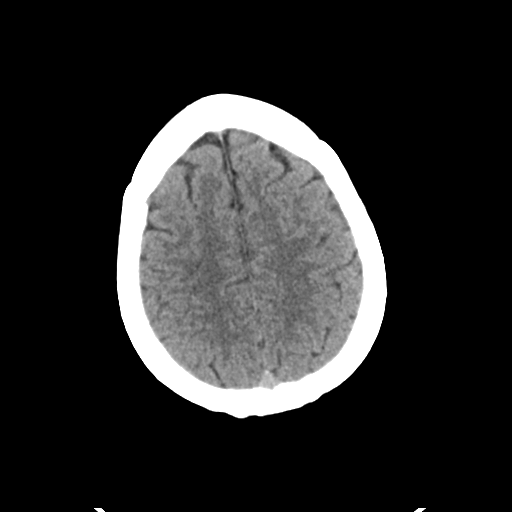
[im 23/29  brain]
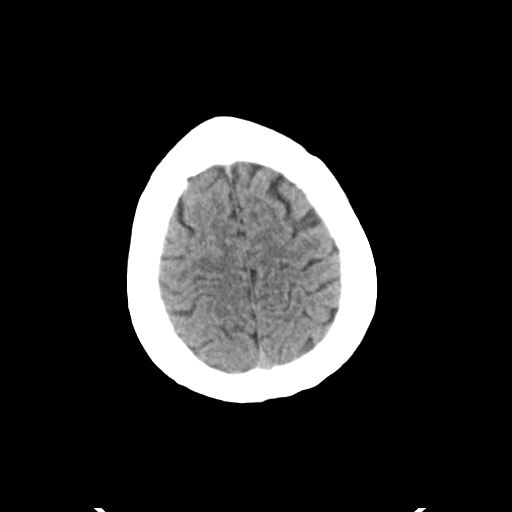
[im 27/29  brain]
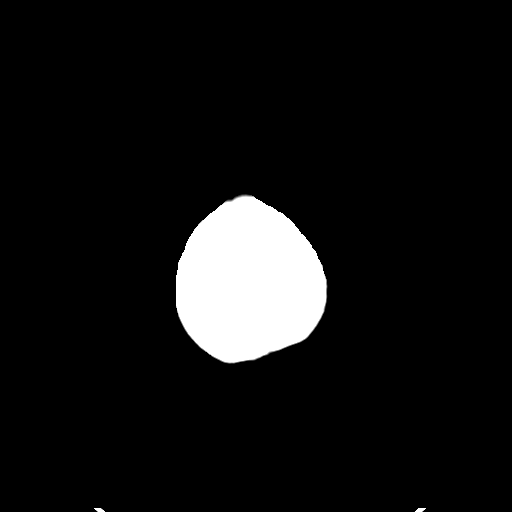
[im 27/29  bone]
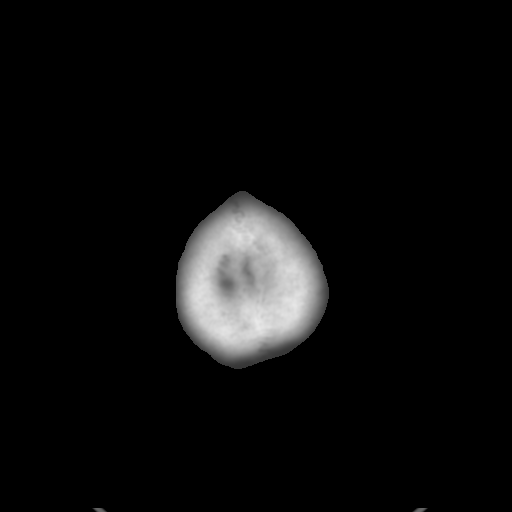

[Series 6: coronal soft tissue · coronal · 0.32mm/px · 3 of 62 slices shown]
[im 21/62  brain]
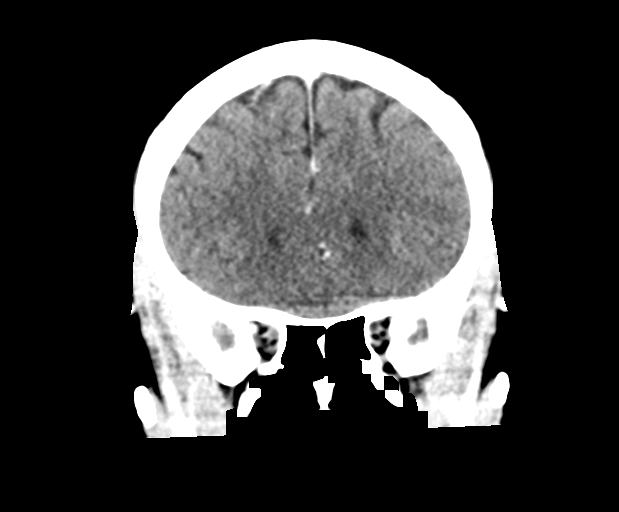
[im 28/62  brain]
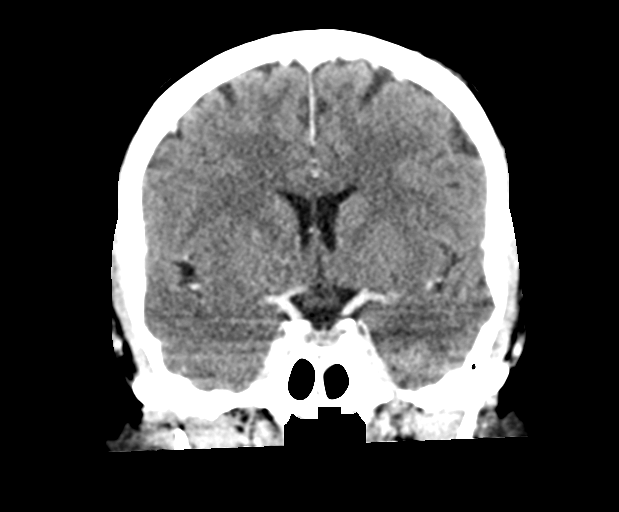
[im 34/62  brain]
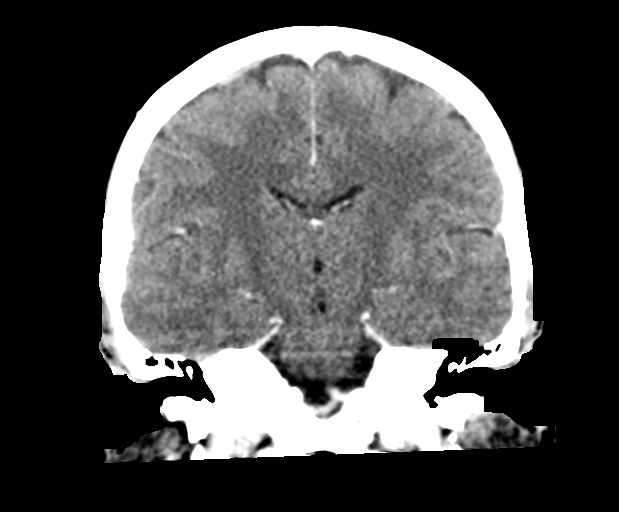

[Series 7: sagittal soft tissue · sagittal · 0.36mm/px · 3 of 48 slices shown]
[im 16/48  brain]
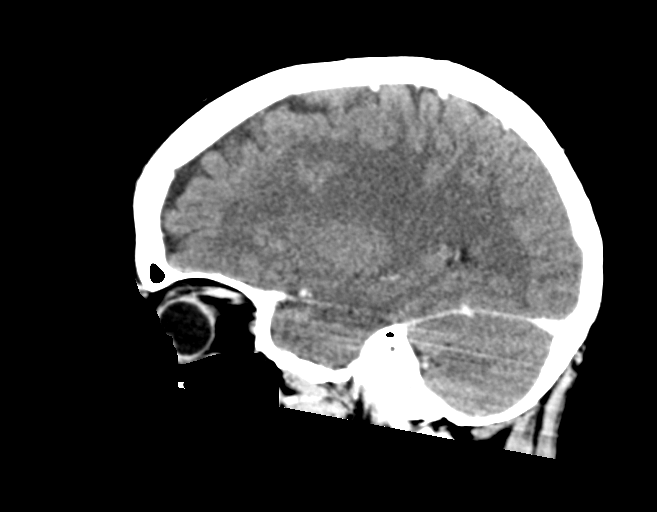
[im 24/48  brain]
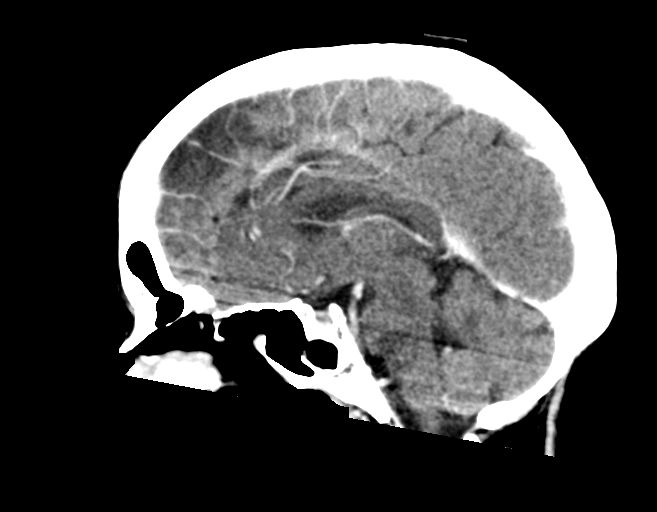
[im 32/48  brain]
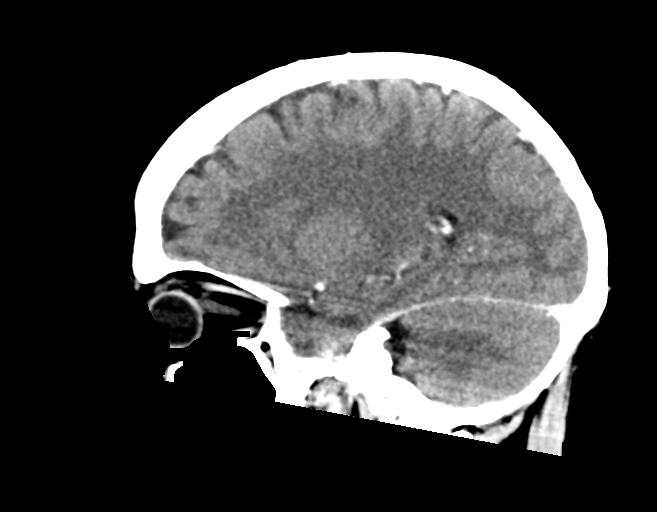

[15 of 47 positions shown; findings below may reference images not displayed]

FINDINGS: Brain: The brain shows a normal appearance without evidence of
malformation, atrophy, old or acute small or large vessel
infarction, mass lesion, hemorrhage, hydrocephalus or extra-axial
collection.

Vascular: No hyperdense vessel. No evidence of atherosclerotic
calcification.

Skull: Normal.  No traumatic finding.  No focal bone lesion.

Sinuses/Orbits: Previous functional endoscopic sinus surgery. No
active inflammatory disease presently. Orbits negative.

Other: Insignificant bone island in the right mandibular condyle.
IMPRESSION: Normal appearance of the brain. No cause of headache or dizziness
identified. Previous functional endoscopic sinus surgery.

## 2021-04-05 ENCOUNTER — Other Ambulatory Visit (HOSPITAL_COMMUNITY): Payer: Self-pay

## 2021-04-05 MED FILL — Pitavastatin Calcium Tab 4 MG: ORAL | 90 days supply | Qty: 90 | Fill #1 | Status: AC

## 2021-04-10 ENCOUNTER — Other Ambulatory Visit (HOSPITAL_COMMUNITY): Payer: Self-pay

## 2021-04-10 DIAGNOSIS — E785 Hyperlipidemia, unspecified: Secondary | ICD-10-CM | POA: Diagnosis not present

## 2021-04-10 DIAGNOSIS — R079 Chest pain, unspecified: Secondary | ICD-10-CM | POA: Diagnosis not present

## 2021-04-10 DIAGNOSIS — E039 Hypothyroidism, unspecified: Secondary | ICD-10-CM | POA: Diagnosis not present

## 2021-04-10 DIAGNOSIS — K219 Gastro-esophageal reflux disease without esophagitis: Secondary | ICD-10-CM | POA: Diagnosis not present

## 2021-04-10 DIAGNOSIS — B009 Herpesviral infection, unspecified: Secondary | ICD-10-CM | POA: Diagnosis not present

## 2021-04-10 DIAGNOSIS — R55 Syncope and collapse: Secondary | ICD-10-CM | POA: Diagnosis not present

## 2021-04-10 DIAGNOSIS — J329 Chronic sinusitis, unspecified: Secondary | ICD-10-CM | POA: Diagnosis not present

## 2021-04-10 DIAGNOSIS — I10 Essential (primary) hypertension: Secondary | ICD-10-CM | POA: Diagnosis not present

## 2021-04-10 MED ORDER — FUROSEMIDE 20 MG PO TABS
20.0000 mg | ORAL_TABLET | Freq: Every day | ORAL | 1 refills | Status: DC
  Filled 2021-04-10: qty 90, 90d supply, fill #0
  Filled 2021-07-31 – 2021-08-27 (×2): qty 90, 90d supply, fill #1

## 2021-04-10 MED ORDER — VALACYCLOVIR HCL 500 MG PO TABS
500.0000 mg | ORAL_TABLET | Freq: Every day | ORAL | 1 refills | Status: DC
  Filled 2021-04-10 (×2): qty 90, 90d supply, fill #0

## 2021-04-10 MED ORDER — ROSUVASTATIN CALCIUM 20 MG PO TABS
20.0000 mg | ORAL_TABLET | Freq: Every day | ORAL | 1 refills | Status: DC
  Filled 2021-05-25: qty 90, 90d supply, fill #0

## 2021-04-10 MED ORDER — ESOMEPRAZOLE MAGNESIUM 40 MG PO CPDR
40.0000 mg | DELAYED_RELEASE_CAPSULE | Freq: Every day | ORAL | 1 refills | Status: DC | PRN
  Filled 2021-04-10 (×2): qty 90, 90d supply, fill #0

## 2021-04-10 MED ORDER — LOSARTAN POTASSIUM-HCTZ 100-25 MG PO TABS
1.0000 | ORAL_TABLET | Freq: Every day | ORAL | 1 refills | Status: AC
  Filled 2021-04-10 (×2): qty 90, 90d supply, fill #0

## 2021-04-10 MED ORDER — MELOXICAM 15 MG PO TABS
15.0000 mg | ORAL_TABLET | Freq: Every day | ORAL | 1 refills | Status: DC
  Filled 2021-04-10: qty 90, 90d supply, fill #0

## 2021-04-10 MED ORDER — LEVOTHYROXINE SODIUM 125 MCG PO TABS
125.0000 ug | ORAL_TABLET | Freq: Every day | ORAL | 1 refills | Status: DC
  Filled 2021-04-10: qty 90, 90d supply, fill #0
  Filled 2021-07-31 (×2): qty 90, 90d supply, fill #1

## 2021-04-22 ENCOUNTER — Other Ambulatory Visit (HOSPITAL_COMMUNITY): Payer: Self-pay

## 2021-04-24 DIAGNOSIS — M79641 Pain in right hand: Secondary | ICD-10-CM | POA: Diagnosis not present

## 2021-04-24 DIAGNOSIS — M79642 Pain in left hand: Secondary | ICD-10-CM | POA: Diagnosis not present

## 2021-04-30 DIAGNOSIS — R55 Syncope and collapse: Secondary | ICD-10-CM | POA: Diagnosis not present

## 2021-05-02 DIAGNOSIS — R55 Syncope and collapse: Secondary | ICD-10-CM | POA: Diagnosis not present

## 2021-05-14 DIAGNOSIS — D224 Melanocytic nevi of scalp and neck: Secondary | ICD-10-CM | POA: Diagnosis not present

## 2021-05-14 DIAGNOSIS — L821 Other seborrheic keratosis: Secondary | ICD-10-CM | POA: Diagnosis not present

## 2021-05-14 DIAGNOSIS — D225 Melanocytic nevi of trunk: Secondary | ICD-10-CM | POA: Diagnosis not present

## 2021-05-14 DIAGNOSIS — D1801 Hemangioma of skin and subcutaneous tissue: Secondary | ICD-10-CM | POA: Diagnosis not present

## 2021-05-14 DIAGNOSIS — L570 Actinic keratosis: Secondary | ICD-10-CM | POA: Diagnosis not present

## 2021-05-14 DIAGNOSIS — L814 Other melanin hyperpigmentation: Secondary | ICD-10-CM | POA: Diagnosis not present

## 2021-05-25 ENCOUNTER — Other Ambulatory Visit (HOSPITAL_COMMUNITY): Payer: Self-pay

## 2021-06-14 ENCOUNTER — Other Ambulatory Visit (HOSPITAL_COMMUNITY): Payer: Self-pay

## 2021-06-14 MED ORDER — ESTROGENS CONJUGATED 0.625 MG/GM VA CREA
TOPICAL_CREAM | VAGINAL | 0 refills | Status: DC
  Filled 2021-06-14: qty 30, 90d supply, fill #0

## 2021-07-04 ENCOUNTER — Telehealth: Payer: 59 | Admitting: Physician Assistant

## 2021-07-04 DIAGNOSIS — J019 Acute sinusitis, unspecified: Secondary | ICD-10-CM | POA: Diagnosis not present

## 2021-07-04 DIAGNOSIS — B9689 Other specified bacterial agents as the cause of diseases classified elsewhere: Secondary | ICD-10-CM | POA: Diagnosis not present

## 2021-07-04 MED ORDER — DOXYCYCLINE HYCLATE 100 MG PO TABS: 100.0000 mg | ORAL_TABLET | Freq: Two times a day (BID) | ORAL | 0 refills | Status: DC

## 2021-07-04 MED ORDER — CLINDAMYCIN HCL 300 MG PO CAPS
300.0000 mg | ORAL_CAPSULE | Freq: Three times a day (TID) | ORAL | 0 refills | Status: AC
Start: 2021-07-04 — End: 2021-07-11

## 2021-07-04 NOTE — Progress Notes (Signed)

## 2021-07-04 NOTE — Progress Notes (Signed)
I have spent 5 minutes in review of e-visit questionnaire, review and updating patient chart, medical decision making and response to patient.   Quinita Kostelecky Cody Keith Cancio, PA-C    

## 2021-07-04 NOTE — Addendum Note (Signed)
Addended by: Brunetta Jeans on: 07/04/2021 11:21 AM   Modules accepted: Orders

## 2021-07-16 DIAGNOSIS — E785 Hyperlipidemia, unspecified: Secondary | ICD-10-CM | POA: Diagnosis not present

## 2021-07-16 DIAGNOSIS — Z7689 Persons encountering health services in other specified circumstances: Secondary | ICD-10-CM | POA: Diagnosis not present

## 2021-07-16 DIAGNOSIS — M1990 Unspecified osteoarthritis, unspecified site: Secondary | ICD-10-CM | POA: Diagnosis not present

## 2021-07-16 DIAGNOSIS — I10 Essential (primary) hypertension: Secondary | ICD-10-CM | POA: Diagnosis not present

## 2021-07-16 DIAGNOSIS — E669 Obesity, unspecified: Secondary | ICD-10-CM | POA: Diagnosis not present

## 2021-07-16 DIAGNOSIS — J302 Other seasonal allergic rhinitis: Secondary | ICD-10-CM | POA: Diagnosis not present

## 2021-07-16 DIAGNOSIS — K219 Gastro-esophageal reflux disease without esophagitis: Secondary | ICD-10-CM | POA: Diagnosis not present

## 2021-07-16 DIAGNOSIS — E039 Hypothyroidism, unspecified: Secondary | ICD-10-CM | POA: Diagnosis not present

## 2021-07-23 ENCOUNTER — Encounter (INDEPENDENT_AMBULATORY_CARE_PROVIDER_SITE_OTHER): Payer: Self-pay

## 2021-07-24 DIAGNOSIS — M25552 Pain in left hip: Secondary | ICD-10-CM | POA: Diagnosis not present

## 2021-07-24 DIAGNOSIS — M79642 Pain in left hand: Secondary | ICD-10-CM | POA: Diagnosis not present

## 2021-07-24 DIAGNOSIS — M79641 Pain in right hand: Secondary | ICD-10-CM | POA: Diagnosis not present

## 2021-07-31 ENCOUNTER — Other Ambulatory Visit (HOSPITAL_COMMUNITY): Payer: Self-pay

## 2021-08-07 ENCOUNTER — Ambulatory Visit: Payer: 59 | Admitting: Family Medicine

## 2021-08-09 ENCOUNTER — Other Ambulatory Visit (HOSPITAL_COMMUNITY): Payer: Self-pay

## 2021-08-14 DIAGNOSIS — E039 Hypothyroidism, unspecified: Secondary | ICD-10-CM | POA: Diagnosis not present

## 2021-08-14 DIAGNOSIS — E785 Hyperlipidemia, unspecified: Secondary | ICD-10-CM | POA: Diagnosis not present

## 2021-08-14 DIAGNOSIS — I10 Essential (primary) hypertension: Secondary | ICD-10-CM | POA: Diagnosis not present

## 2021-08-14 DIAGNOSIS — J069 Acute upper respiratory infection, unspecified: Secondary | ICD-10-CM | POA: Diagnosis not present

## 2021-08-27 ENCOUNTER — Other Ambulatory Visit (HOSPITAL_COMMUNITY): Payer: Self-pay

## 2021-09-24 DIAGNOSIS — E785 Hyperlipidemia, unspecified: Secondary | ICD-10-CM | POA: Diagnosis not present

## 2021-09-24 DIAGNOSIS — I10 Essential (primary) hypertension: Secondary | ICD-10-CM | POA: Diagnosis not present

## 2021-09-24 DIAGNOSIS — F419 Anxiety disorder, unspecified: Secondary | ICD-10-CM | POA: Diagnosis not present

## 2021-09-24 DIAGNOSIS — G4700 Insomnia, unspecified: Secondary | ICD-10-CM | POA: Diagnosis not present

## 2021-10-23 DIAGNOSIS — F418 Other specified anxiety disorders: Secondary | ICD-10-CM | POA: Diagnosis not present

## 2021-10-23 DIAGNOSIS — E559 Vitamin D deficiency, unspecified: Secondary | ICD-10-CM | POA: Diagnosis not present

## 2021-10-23 DIAGNOSIS — E039 Hypothyroidism, unspecified: Secondary | ICD-10-CM | POA: Diagnosis not present

## 2021-10-23 DIAGNOSIS — I10 Essential (primary) hypertension: Secondary | ICD-10-CM | POA: Diagnosis not present

## 2021-10-23 DIAGNOSIS — L659 Nonscarring hair loss, unspecified: Secondary | ICD-10-CM | POA: Diagnosis not present

## 2021-10-23 DIAGNOSIS — R0602 Shortness of breath: Secondary | ICD-10-CM | POA: Diagnosis not present

## 2021-10-23 DIAGNOSIS — G4700 Insomnia, unspecified: Secondary | ICD-10-CM | POA: Diagnosis not present

## 2021-10-23 DIAGNOSIS — R7303 Prediabetes: Secondary | ICD-10-CM | POA: Diagnosis not present

## 2021-10-23 DIAGNOSIS — E785 Hyperlipidemia, unspecified: Secondary | ICD-10-CM | POA: Diagnosis not present

## 2021-10-23 DIAGNOSIS — R5383 Other fatigue: Secondary | ICD-10-CM | POA: Diagnosis not present

## 2021-11-06 DIAGNOSIS — Z9189 Other specified personal risk factors, not elsewhere classified: Secondary | ICD-10-CM | POA: Diagnosis not present

## 2021-11-06 DIAGNOSIS — E7800 Pure hypercholesterolemia, unspecified: Secondary | ICD-10-CM | POA: Diagnosis not present

## 2021-11-06 DIAGNOSIS — K5909 Other constipation: Secondary | ICD-10-CM | POA: Diagnosis not present

## 2021-11-06 DIAGNOSIS — R7303 Prediabetes: Secondary | ICD-10-CM | POA: Diagnosis not present

## 2021-11-06 DIAGNOSIS — E663 Overweight: Secondary | ICD-10-CM | POA: Diagnosis not present

## 2021-11-06 DIAGNOSIS — F430 Acute stress reaction: Secondary | ICD-10-CM | POA: Diagnosis not present

## 2021-11-07 ENCOUNTER — Other Ambulatory Visit (HOSPITAL_COMMUNITY): Payer: Self-pay

## 2021-11-07 MED ORDER — WEGOVY 1.7 MG/0.75ML ~~LOC~~ SOAJ: SUBCUTANEOUS | 0 refills | Status: DC

## 2021-11-07 MED ORDER — WEGOVY 0.5 MG/0.5ML ~~LOC~~ SOAJ
SUBCUTANEOUS | 0 refills | Status: DC
  Filled 2021-11-07: qty 2, 28d supply, fill #0

## 2021-11-07 MED ORDER — WEGOVY 1 MG/0.5ML ~~LOC~~ SOAJ: SUBCUTANEOUS | 0 refills | Status: DC

## 2021-11-08 ENCOUNTER — Other Ambulatory Visit (HOSPITAL_COMMUNITY): Payer: Self-pay

## 2021-11-08 DIAGNOSIS — H524 Presbyopia: Secondary | ICD-10-CM | POA: Diagnosis not present

## 2021-11-12 ENCOUNTER — Other Ambulatory Visit (HOSPITAL_COMMUNITY): Payer: Self-pay

## 2021-11-13 ENCOUNTER — Other Ambulatory Visit (HOSPITAL_COMMUNITY): Payer: Self-pay

## 2021-11-15 ENCOUNTER — Other Ambulatory Visit (HOSPITAL_COMMUNITY): Payer: Self-pay

## 2021-11-18 ENCOUNTER — Other Ambulatory Visit (HOSPITAL_COMMUNITY): Payer: Self-pay

## 2021-11-18 MED ORDER — SAXENDA 18 MG/3ML ~~LOC~~ SOPN
PEN_INJECTOR | SUBCUTANEOUS | 0 refills | Status: DC
  Filled 2021-11-18: qty 15, 30d supply, fill #0

## 2021-11-18 MED ORDER — LEVOTHYROXINE SODIUM 125 MCG PO TABS
ORAL_TABLET | ORAL | 1 refills | Status: DC
  Filled 2021-11-18: qty 90, 90d supply, fill #0
  Filled 2022-01-25: qty 90, 90d supply, fill #1

## 2021-11-21 ENCOUNTER — Other Ambulatory Visit (HOSPITAL_COMMUNITY): Payer: Self-pay

## 2021-11-21 DIAGNOSIS — Z6828 Body mass index (BMI) 28.0-28.9, adult: Secondary | ICD-10-CM | POA: Diagnosis not present

## 2021-11-21 DIAGNOSIS — N760 Acute vaginitis: Secondary | ICD-10-CM | POA: Diagnosis not present

## 2021-11-21 DIAGNOSIS — B009 Herpesviral infection, unspecified: Secondary | ICD-10-CM | POA: Diagnosis not present

## 2021-11-21 DIAGNOSIS — Z1151 Encounter for screening for human papillomavirus (HPV): Secondary | ICD-10-CM | POA: Diagnosis not present

## 2021-11-21 DIAGNOSIS — F419 Anxiety disorder, unspecified: Secondary | ICD-10-CM | POA: Diagnosis not present

## 2021-11-21 DIAGNOSIS — Z760 Encounter for issue of repeat prescription: Secondary | ICD-10-CM | POA: Diagnosis not present

## 2021-11-21 DIAGNOSIS — Z01419 Encounter for gynecological examination (general) (routine) without abnormal findings: Secondary | ICD-10-CM | POA: Diagnosis not present

## 2021-11-21 DIAGNOSIS — Z124 Encounter for screening for malignant neoplasm of cervix: Secondary | ICD-10-CM | POA: Diagnosis not present

## 2021-11-21 MED ORDER — VALACYCLOVIR HCL 1 G PO TABS
1000.0000 mg | ORAL_TABLET | Freq: Every day | ORAL | 6 refills | Status: AC | PRN
  Filled 2021-11-21: qty 30, 30d supply, fill #0

## 2021-11-21 MED ORDER — PREMARIN 0.625 MG/GM VA CREA
TOPICAL_CREAM | VAGINAL | 2 refills | Status: DC
  Filled 2021-11-21: qty 30, 90d supply, fill #0

## 2021-11-21 MED ORDER — ALPRAZOLAM 0.5 MG PO TABS
0.5000 mg | ORAL_TABLET | Freq: Three times a day (TID) | ORAL | 2 refills | Status: DC
  Filled 2021-11-21: qty 30, 10d supply, fill #0
  Filled 2021-12-18: qty 30, 10d supply, fill #1
  Filled 2022-01-06: qty 30, 10d supply, fill #2

## 2021-12-03 ENCOUNTER — Other Ambulatory Visit (HOSPITAL_COMMUNITY): Payer: Self-pay

## 2021-12-03 MED ORDER — CLINDAMYCIN HCL 300 MG PO CAPS
300.0000 mg | ORAL_CAPSULE | Freq: Three times a day (TID) | ORAL | 1 refills | Status: DC
  Filled 2021-12-03 – 2021-12-18 (×2): qty 30, 10d supply, fill #0

## 2021-12-03 MED ORDER — AZITHROMYCIN 250 MG PO TABS
ORAL_TABLET | ORAL | 0 refills | Status: DC
  Filled 2021-12-03: qty 6, 5d supply, fill #0

## 2021-12-12 DIAGNOSIS — Z1231 Encounter for screening mammogram for malignant neoplasm of breast: Secondary | ICD-10-CM | POA: Diagnosis not present

## 2021-12-18 ENCOUNTER — Other Ambulatory Visit (HOSPITAL_COMMUNITY): Payer: Self-pay

## 2021-12-18 MED ORDER — LIVALO 4 MG PO TABS
1.0000 | ORAL_TABLET | Freq: Every day | ORAL | 0 refills | Status: DC
  Filled 2021-12-18: qty 90, 90d supply, fill #0

## 2021-12-18 MED ORDER — FUROSEMIDE 20 MG PO TABS
20.0000 mg | ORAL_TABLET | Freq: Every day | ORAL | 0 refills | Status: DC
  Filled 2021-12-18: qty 90, 90d supply, fill #0

## 2022-01-06 ENCOUNTER — Other Ambulatory Visit (HOSPITAL_COMMUNITY): Payer: Self-pay

## 2022-01-06 MED ORDER — TECHLITE PEN NEEDLES 32G X 4 MM MISC
0 refills | Status: AC
  Filled 2022-01-06: qty 100, 90d supply, fill #0

## 2022-01-08 ENCOUNTER — Other Ambulatory Visit (HOSPITAL_COMMUNITY): Payer: Self-pay

## 2022-01-10 ENCOUNTER — Other Ambulatory Visit (HOSPITAL_COMMUNITY): Payer: Self-pay

## 2022-01-13 ENCOUNTER — Other Ambulatory Visit (HOSPITAL_COMMUNITY): Payer: Self-pay

## 2022-01-13 DIAGNOSIS — C44722 Squamous cell carcinoma of skin of right lower limb, including hip: Secondary | ICD-10-CM | POA: Diagnosis not present

## 2022-01-13 DIAGNOSIS — C44729 Squamous cell carcinoma of skin of left lower limb, including hip: Secondary | ICD-10-CM | POA: Diagnosis not present

## 2022-01-13 DIAGNOSIS — R208 Other disturbances of skin sensation: Secondary | ICD-10-CM | POA: Diagnosis not present

## 2022-01-13 DIAGNOSIS — Z85828 Personal history of other malignant neoplasm of skin: Secondary | ICD-10-CM | POA: Diagnosis not present

## 2022-01-13 DIAGNOSIS — L578 Other skin changes due to chronic exposure to nonionizing radiation: Secondary | ICD-10-CM | POA: Diagnosis not present

## 2022-01-13 MED ORDER — MUPIROCIN 2 % EX OINT
TOPICAL_OINTMENT | CUTANEOUS | 2 refills | Status: AC
  Filled 2022-01-13: qty 22, 10d supply, fill #0

## 2022-01-15 ENCOUNTER — Other Ambulatory Visit (HOSPITAL_COMMUNITY): Payer: Self-pay

## 2022-01-15 MED ORDER — NEOMYCIN-POLYMYXIN-DEXAMETH 0.1 % OP OINT
TOPICAL_OINTMENT | OPHTHALMIC | 1 refills | Status: DC
  Filled 2022-01-15: qty 3.5, 7d supply, fill #0

## 2022-01-15 MED ORDER — ERYTHROMYCIN 5 MG/GM OP OINT
TOPICAL_OINTMENT | OPHTHALMIC | 1 refills | Status: DC
  Filled 2022-01-15: qty 3.5, 7d supply, fill #0

## 2022-01-16 ENCOUNTER — Other Ambulatory Visit (HOSPITAL_COMMUNITY): Payer: Self-pay

## 2022-01-22 ENCOUNTER — Other Ambulatory Visit (HOSPITAL_COMMUNITY): Payer: Self-pay

## 2022-01-22 ENCOUNTER — Encounter (INDEPENDENT_AMBULATORY_CARE_PROVIDER_SITE_OTHER): Payer: Self-pay

## 2022-01-22 MED ORDER — CLINDAMYCIN HCL 300 MG PO CAPS: 300.0000 mg | ORAL_CAPSULE | Freq: Four times a day (QID) | ORAL | 1 refills | Status: DC

## 2022-01-22 MED ORDER — CLINDAMYCIN HCL 300 MG PO CAPS
300.0000 mg | ORAL_CAPSULE | Freq: Four times a day (QID) | ORAL | 0 refills | Status: DC
  Filled 2022-01-22: qty 40, 10d supply, fill #0

## 2022-01-25 ENCOUNTER — Other Ambulatory Visit (HOSPITAL_COMMUNITY): Payer: Self-pay

## 2022-01-27 ENCOUNTER — Other Ambulatory Visit (HOSPITAL_COMMUNITY): Payer: Self-pay

## 2022-01-27 DIAGNOSIS — E039 Hypothyroidism, unspecified: Secondary | ICD-10-CM | POA: Diagnosis not present

## 2022-01-27 DIAGNOSIS — I10 Essential (primary) hypertension: Secondary | ICD-10-CM | POA: Diagnosis not present

## 2022-01-27 DIAGNOSIS — E782 Mixed hyperlipidemia: Secondary | ICD-10-CM | POA: Diagnosis not present

## 2022-01-27 DIAGNOSIS — B009 Herpesviral infection, unspecified: Secondary | ICD-10-CM | POA: Diagnosis not present

## 2022-01-27 DIAGNOSIS — G4709 Other insomnia: Secondary | ICD-10-CM | POA: Diagnosis not present

## 2022-01-27 DIAGNOSIS — K219 Gastro-esophageal reflux disease without esophagitis: Secondary | ICD-10-CM | POA: Diagnosis not present

## 2022-01-27 DIAGNOSIS — F411 Generalized anxiety disorder: Secondary | ICD-10-CM | POA: Diagnosis not present

## 2022-01-27 DIAGNOSIS — R7303 Prediabetes: Secondary | ICD-10-CM | POA: Diagnosis not present

## 2022-01-27 DIAGNOSIS — E538 Deficiency of other specified B group vitamins: Secondary | ICD-10-CM | POA: Diagnosis not present

## 2022-01-28 ENCOUNTER — Other Ambulatory Visit (HOSPITAL_COMMUNITY): Payer: Self-pay

## 2022-01-28 MED ORDER — ZOLPIDEM TARTRATE 10 MG PO TABS
ORAL_TABLET | ORAL | 5 refills | Status: AC
  Filled 2022-01-28: qty 30, 30d supply, fill #0
  Filled 2022-03-08: qty 30, 30d supply, fill #1
  Filled 2022-04-07: qty 30, 30d supply, fill #2
  Filled 2022-05-07: qty 30, 30d supply, fill #3
  Filled 2022-06-10: qty 30, 30d supply, fill #4
  Filled 2022-07-04 – 2022-07-15 (×2): qty 30, 30d supply, fill #5

## 2022-01-28 MED ORDER — ESOMEPRAZOLE MAGNESIUM 40 MG PO CPDR
DELAYED_RELEASE_CAPSULE | ORAL | 2 refills | Status: AC
  Filled 2022-01-28 – 2022-04-25 (×2): qty 90, 90d supply, fill #0
  Filled 2022-08-30: qty 90, 90d supply, fill #1

## 2022-01-28 MED ORDER — LEVOTHYROXINE SODIUM 125 MCG PO TABS
ORAL_TABLET | ORAL | 2 refills | Status: DC
  Filled 2022-01-28: qty 90, 90d supply, fill #0

## 2022-01-28 MED ORDER — VALACYCLOVIR HCL 500 MG PO TABS
ORAL_TABLET | ORAL | 2 refills | Status: AC
  Filled 2022-01-28: qty 90, 90d supply, fill #0
  Filled 2022-04-25: qty 90, 90d supply, fill #1
  Filled 2022-08-30: qty 90, 90d supply, fill #2

## 2022-01-28 MED ORDER — ALPRAZOLAM 0.5 MG PO TABS
ORAL_TABLET | ORAL | 3 refills | Status: AC
  Filled 2022-01-28: qty 60, 30d supply, fill #0
  Filled 2022-03-08: qty 60, 30d supply, fill #1
  Filled 2022-04-07: qty 60, 30d supply, fill #2
  Filled 2022-06-10: qty 60, 30d supply, fill #3

## 2022-01-28 MED ORDER — CYANOCOBALAMIN 1000 MCG/ML IJ SOLN
INTRAMUSCULAR | 3 refills | Status: AC
  Filled 2022-01-28: qty 4, 28d supply, fill #0
  Filled 2022-03-08: qty 4, 28d supply, fill #1
  Filled 2022-04-25: qty 4, 28d supply, fill #2
  Filled 2022-08-30: qty 4, 28d supply, fill #3

## 2022-01-28 MED ORDER — LIVALO 4 MG PO TABS
ORAL_TABLET | ORAL | 2 refills | Status: AC
  Filled 2022-01-28 – 2022-04-25 (×2): qty 90, 90d supply, fill #0
  Filled 2022-08-30: qty 90, 90d supply, fill #1

## 2022-01-29 ENCOUNTER — Other Ambulatory Visit (HOSPITAL_COMMUNITY): Payer: Self-pay | Admitting: Pediatric Emergency Medicine

## 2022-01-29 ENCOUNTER — Ambulatory Visit: Payer: 59 | Admitting: Internal Medicine

## 2022-01-29 ENCOUNTER — Encounter: Payer: Self-pay | Admitting: Internal Medicine

## 2022-01-29 ENCOUNTER — Other Ambulatory Visit (HOSPITAL_COMMUNITY): Payer: Self-pay

## 2022-01-29 VITALS — BP 116/79 | HR 71 | Ht 63.0 in | Wt 161.2 lb

## 2022-01-29 DIAGNOSIS — E785 Hyperlipidemia, unspecified: Secondary | ICD-10-CM

## 2022-01-29 DIAGNOSIS — I4729 Other ventricular tachycardia: Secondary | ICD-10-CM | POA: Diagnosis not present

## 2022-01-29 DIAGNOSIS — R079 Chest pain, unspecified: Secondary | ICD-10-CM | POA: Diagnosis not present

## 2022-01-29 DIAGNOSIS — Z01812 Encounter for preprocedural laboratory examination: Secondary | ICD-10-CM

## 2022-01-29 DIAGNOSIS — Z8249 Family history of ischemic heart disease and other diseases of the circulatory system: Secondary | ICD-10-CM | POA: Diagnosis not present

## 2022-01-29 MED ORDER — METOPROLOL TARTRATE 50 MG PO TABS
50.0000 mg | ORAL_TABLET | ORAL | 0 refills | Status: AC
  Filled 2022-01-29 (×2): qty 1, 1d supply, fill #0

## 2022-01-29 MED ORDER — REPATHA SURECLICK 140 MG/ML ~~LOC~~ SOAJ
1.0000 | SUBCUTANEOUS | 3 refills | Status: DC
  Filled 2022-01-29 (×2): qty 6, 84d supply, fill #0
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
## 2022-01-29 NOTE — Patient Instructions (Addendum)
Medication Instructions:  Dr. Debara Pickett recommends Repatha '140mg'$ /mL (PCSK9). This is an injectable cholesterol medication self-administered once every 14 days. This medication will likely need prior approval with your insurance company, which we will work on. If the medication is not approved initially, we may need to do an appeal with your insurance.   Administer medication in area of fatty tissue such as abdomen, outer thigh, back of upper arm - and rotate site with each injection Store medication in refrigerator until ready to administer - allow to sit at room temp for 30 mins - 1 hour prior to injection Dispose of medication in a SHARPS container - your pharmacy should be able to direct you on this and proper disposal   If you need a co-pay card for Repatha: MicroLists.at If you need a co-pay card for Praluent: https://praluentpatientsupport.[email or web address].uy  Patient Assistance:    These foundations have funds at various times.   The PAN Foundation: https://www.panfoundation.org/disease-funds/hypercholesterolemia/ -- can sign up for wait list  The [facility] offers assistance to help pay for medication copays.  They will cover copays for all cholesterol lowering meds, including statins, fibrates, omega-3 fish oils like Vascepa, ezetimibe, Repatha, Praluent, Nexletol, Nexlizet.  The cards are usually good for $2,500 or 12 months, whichever comes first. Go to healthwellfoundation.org Click on "Apply Now" Answer questions as to whom is applying (patient or representative) Your disease fund will be "hypercholesterolemia - Medicare access" They will ask questions about finances and which medications you are taking for cholesterol When you submit, the approval is usually within minutes.  You will need to print the card information from the site You will need to show this information to your pharmacy, they will bill your Medicare Part D plan first -then bill Health Well  --for the copay.   You can also call them at [phone], although the hold times can be quite long.     *If you need a refill on your cardiac medications before your next appointment, please call your pharmacy*   Lab Work: Non-Fasting lab work prior to CT test -- BMET, LPa  Fasting NMR lipoprofile prior to next appointment with Dr. Debara Pickett  If you have labs (blood work) drawn today and your tests are completely normal, you will receive your results only by: [place] (if you have MyChart) OR A paper copy in the mail If you have any lab test that is abnormal or we need to change your treatment, we will call you to review the results.   Testing/Procedures: Coronary CT at [place]: At [facility], you and your health needs are our priority.  As part of our continuing mission to provide you with exceptional heart care, we have created designated Provider Care Teams.  These Care Teams include your primary Cardiologist (physician) and Advanced Practice Providers (APPs -  Physician Assistants and Nurse Practitioners) who all work together to provide you with the care you need, when you need it.  We recommend signing up for the patient portal called "MyChart".  Sign up information is provided on this After Visit Summary.  MyChart is used to connect with patients for Virtual Visits (Telemedicine).  Patients are able to view lab/test results, encounter notes, upcoming appointments, etc.  Non-urgent messages can be sent to your provider as well.   To learn more about what you can do with MyChart, go to [email or web address].    Your next appointment:   4 month(s)  The format for your next appointment:  In Person  Provider:   Dr. Lyman Bishop     Your cardiac CT will be scheduled at one of the below locations:   [facility] [address] [place], [place] [address] [phone]  [place] [address] [place], [place] [address] [phone]  If scheduled at [facility], please arrive at the [facility] and Children's Entrance (Entrance C2) of [facility] 30 minutes prior to test start time. You can use the FREE valet parking offered at entrance C (encouraged to control the heart rate for the test)  Proceed to the [facility] Radiology Department (first floor) to check-in and test prep.  All radiology patients and guests should use entrance C2 at [facility], accessed from [facility], even though the hospital's physical address listed is [address]    If scheduled at [facility], please arrive 15 mins early for check-in and test prep.  Please follow these instructions carefully (unless otherwise directed):   On the Night Before the Test: Be sure to Drink plenty of water. Do not consume any caffeinated/decaffeinated beverages or chocolate 12 hours prior to your test. Do not take any antihistamines 12 hours prior to your test.  On the Day of the Test: Drink plenty of water until 1 hour prior to the test. Do not eat any food 4 hours prior to the test. You may take your regular medications prior to the test.  Take metoprolol (Lopressor) two hours prior to test. HOLD Furosemide/Hydrochlorothiazide morning of the test. FEMALES- please wear underwire-free bra if available, avoid dresses & tight clothing  After the Test: Drink plenty of water. After receiving IV contrast, you may experience a mild flushed feeling. This is normal. On occasion, you may experience a mild rash up to 24 hours after the test. This is not dangerous. If this occurs, you can take Benadryl 25 mg and increase your fluid intake. If you experience trouble breathing, this can be serious. If it is severe call 911 IMMEDIATELY. If it is mild, please call our office. If you take any of these medications:  Glipizide/Metformin, Avandament, Glucavance, please do not take 48 hours after completing test unless otherwise instructed.  We will call to schedule your test 2-4 weeks out understanding that some insurance companies will need an authorization prior to the service being performed.   For non-scheduling related questions, please contact the cardiac imaging nurse navigator should you have any questions/concerns: Marchia Bond, Cardiac Imaging Nurse Navigator Gordy Clement, Cardiac Imaging Nurse Navigator [facility] Direct Office Dial: [phone]   For scheduling needs, including cancellations and rescheduling, please call [place], [phone].

## 2022-01-30 ENCOUNTER — Other Ambulatory Visit (HOSPITAL_COMMUNITY): Payer: Self-pay

## 2022-01-30 ENCOUNTER — Telehealth: Payer: Self-pay | Admitting: *Deleted

## 2022-01-30 MED ORDER — CLINDAMYCIN HCL 300 MG PO CAPS
300.0000 mg | ORAL_CAPSULE | Freq: Four times a day (QID) | ORAL | 0 refills | Status: DC
  Filled 2022-01-30: qty 40, 10d supply, fill #0

## 2022-01-30 NOTE — Telephone Encounter (Signed)
Repatha PA submitted via CMM.    Key: [id number]

## 2022-01-31 ENCOUNTER — Other Ambulatory Visit (HOSPITAL_COMMUNITY): Payer: Self-pay

## 2022-02-01 ENCOUNTER — Other Ambulatory Visit (HOSPITAL_COMMUNITY): Payer: Self-pay

## 2022-02-03 NOTE — Telephone Encounter (Signed)
Repatha PA approved.    The authorization is effective from 02/01/2022 to 02/01/2023, as long as the member is enrolled in their current health plan. The request was approved as submitted. A written notification letter will follow with additional details.

## 2022-02-05 ENCOUNTER — Other Ambulatory Visit (HOSPITAL_COMMUNITY): Payer: Self-pay

## 2022-02-27 ENCOUNTER — Other Ambulatory Visit (HOSPITAL_COMMUNITY): Payer: Self-pay

## 2022-03-04 DIAGNOSIS — D2239 Melanocytic nevi of other parts of face: Secondary | ICD-10-CM | POA: Diagnosis not present

## 2022-03-04 DIAGNOSIS — L570 Actinic keratosis: Secondary | ICD-10-CM | POA: Diagnosis not present

## 2022-03-04 DIAGNOSIS — L814 Other melanin hyperpigmentation: Secondary | ICD-10-CM | POA: Diagnosis not present

## 2022-03-04 DIAGNOSIS — L821 Other seborrheic keratosis: Secondary | ICD-10-CM | POA: Diagnosis not present

## 2022-03-04 DIAGNOSIS — C44722 Squamous cell carcinoma of skin of right lower limb, including hip: Secondary | ICD-10-CM | POA: Diagnosis not present

## 2022-03-04 DIAGNOSIS — Z85828 Personal history of other malignant neoplasm of skin: Secondary | ICD-10-CM | POA: Diagnosis not present

## 2022-03-04 DIAGNOSIS — D1801 Hemangioma of skin and subcutaneous tissue: Secondary | ICD-10-CM | POA: Diagnosis not present

## 2022-03-08 ENCOUNTER — Other Ambulatory Visit (HOSPITAL_COMMUNITY): Payer: Self-pay

## 2022-03-11 ENCOUNTER — Other Ambulatory Visit (HOSPITAL_COMMUNITY): Payer: Self-pay

## 2022-03-21 ENCOUNTER — Other Ambulatory Visit (HOSPITAL_COMMUNITY): Payer: Self-pay

## 2022-03-21 ENCOUNTER — Other Ambulatory Visit: Payer: Self-pay | Admitting: Internal Medicine

## 2022-03-21 DIAGNOSIS — E785 Hyperlipidemia, unspecified: Secondary | ICD-10-CM

## 2022-03-21 DIAGNOSIS — R079 Chest pain, unspecified: Secondary | ICD-10-CM

## 2022-03-25 ENCOUNTER — Telehealth: Payer: 59 | Admitting: Physician Assistant

## 2022-03-25 DIAGNOSIS — B9689 Other specified bacterial agents as the cause of diseases classified elsewhere: Secondary | ICD-10-CM | POA: Diagnosis not present

## 2022-03-25 DIAGNOSIS — J019 Acute sinusitis, unspecified: Secondary | ICD-10-CM | POA: Diagnosis not present

## 2022-03-25 MED ORDER — FLUCONAZOLE 150 MG PO TABS
150.0000 mg | ORAL_TABLET | Freq: Once | ORAL | 0 refills | Status: AC
Start: 2022-03-25 — End: 2022-03-25

## 2022-03-25 MED ORDER — CLINDAMYCIN HCL 300 MG PO CAPS: 300.0000 mg | ORAL_CAPSULE | Freq: Four times a day (QID) | ORAL | 0 refills | Status: DC

## 2022-03-25 NOTE — Progress Notes (Signed)
E-Visit for Sinus Problems  We are sorry that you are not feeling well.  Here is how we plan to help!  Based on what you have shared with me it looks like you have sinusitis.  Sinusitis is inflammation and infection in the sinus cavities of the head.  Based on your presentation I believe you most likely have Acute Bacterial Sinusitis.  This is an infection caused by bacteria and is treated with antibiotics. I have prescribed Clindamycin. I have also sent in Diflucan in case of an antibiotic-induced yeast infection.    You may use an oral decongestant such as Mucinex D or if you have glaucoma or high blood pressure use plain Mucinex. Saline nasal spray help and can safely be used as often as needed for congestion.  If you develop worsening sinus pain, fever or notice severe headache and vision changes, or if symptoms are not better after completion of antibiotic, please schedule an appointment with a health care provider.    Sinus infections are not as easily transmitted as other respiratory infection, however we still recommend that you avoid close contact with loved ones, especially the very young and elderly.  Remember to wash your hands thoroughly throughout the day as this is the number one way to prevent the spread of infection!  Home Care: Only take medications as instructed by your medical team. Complete the entire course of an antibiotic. Do not take these medications with alcohol. A steam or ultrasonic humidifier can help congestion.  You can place a towel over your head and breathe in the steam from hot water coming from a faucet. Avoid close contacts especially the very young and the elderly. Cover your mouth when you cough or sneeze. Always remember to wash your hands.  Get Help Right Away If: You develop worsening fever or sinus pain. You develop a severe head ache or visual changes. Your symptoms persist after you have completed your treatment plan.  Make sure you Understand  these instructions. Will watch your condition. Will get help right away if you are not doing well or get worse.  Thank you for choosing an e-visit.  Your e-visit answers were reviewed by a board certified advanced clinical practitioner to complete your personal care plan. Depending upon the condition, your plan could have included both over the counter or prescription medications.  Please review your pharmacy choice. Make sure the pharmacy is open so you can pick up prescription now. If there is a problem, you may contact your provider through [employer] and have the prescription routed to another pharmacy.  Your safety is important to [place]. If you have drug allergies check your prescription carefully.   For the next 24 hours you can use MyChart to ask questions about today's visit, request a non-urgent call back, or ask for a work or school excuse. You will get an email in the next two days asking about your experience. I hope that your e-visit has been valuable and will speed your recovery.

## 2022-03-25 NOTE — Progress Notes (Signed)
I have spent 5 minutes in review of e-visit questionnaire, review and updating patient chart, medical decision making and response to patient.   Pete Schnitzer Cody Chiquitta Matty, PA-C    

## 2022-03-26 ENCOUNTER — Other Ambulatory Visit (HOSPITAL_COMMUNITY): Payer: Self-pay

## 2022-03-26 MED ORDER — REPATHA SURECLICK 140 MG/ML ~~LOC~~ SOAJ
1.0000 mL | SUBCUTANEOUS | 3 refills | Status: DC
  Filled 2022-03-26: qty 6, 84d supply, fill #0
  Filled 2022-08-30: qty 6, 84d supply, fill #1

## 2022-04-01 ENCOUNTER — Other Ambulatory Visit (HOSPITAL_COMMUNITY): Payer: Self-pay

## 2022-04-01 ENCOUNTER — Other Ambulatory Visit: Payer: Self-pay

## 2022-04-03 ENCOUNTER — Other Ambulatory Visit (HOSPITAL_COMMUNITY): Payer: Self-pay

## 2022-04-03 MED ORDER — PREMARIN 0.625 MG/GM VA CREA
TOPICAL_CREAM | VAGINAL | 3 refills | Status: AC
  Filled 2022-04-03: qty 30, 90d supply, fill #0
  Filled 2022-06-10 – 2022-06-14 (×2): qty 30, 90d supply, fill #1
  Filled 2022-12-31 (×2): qty 30, 90d supply, fill #2

## 2022-04-07 ENCOUNTER — Other Ambulatory Visit (HOSPITAL_COMMUNITY): Payer: Self-pay

## 2022-04-07 ENCOUNTER — Telehealth: Payer: 59 | Admitting: Family

## 2022-04-07 DIAGNOSIS — R399 Unspecified symptoms and signs involving the genitourinary system: Secondary | ICD-10-CM | POA: Diagnosis not present

## 2022-04-07 MED ORDER — NITROFURANTOIN MONOHYD MACRO 100 MG PO CAPS: 100.0000 mg | ORAL_CAPSULE | Freq: Two times a day (BID) | ORAL | 0 refills | Status: AC

## 2022-04-07 NOTE — Progress Notes (Signed)
E-Visit for Urinary Problems  We are sorry that you are not feeling well.  Here is how we plan to help!  Based on what you shared with me it looks like you most likely have a simple urinary tract infection.  A UTI (Urinary Tract Infection) is a bacterial infection of the bladder.  Most cases of urinary tract infections are simple to treat but a key part of your care is to encourage you to drink plenty of fluids and watch your symptoms carefully.  I have prescribed MacroBid 100 mg twice a day for 7 days.  Your symptoms should gradually improve. Call us if the burning in your urine worsens, you develop worsening fever, back pain or pelvic pain or if your symptoms do not resolve after completing the antibiotic.  Urinary tract infections can be prevented by drinking plenty of water to keep your body hydrated.  Also be sure when you wipe, wipe from front to back and don't hold it in!  If possible, empty your bladder every 4 hours.  HOME CARE Drink plenty of fluids Compete the full course of the antibiotics even if the symptoms resolve Remember, when you need to go.go. Holding in your urine can increase the likelihood of getting a UTI! GET HELP RIGHT AWAY IF: You cannot urinate You get a high fever Worsening back pain occurs You see blood in your urine You feel sick to your stomach or throw up You feel like you are going to pass out  MAKE SURE YOU  Understand these instructions. Will watch your condition. Will get help right away if you are not doing well or get worse.   Thank you for choosing an e-visit.  Your e-visit answers were reviewed by a board certified advanced clinical practitioner to complete your personal care plan. Depending upon the condition, your plan could have included both over the counter or prescription medications.  Please review your pharmacy choice. Make sure the pharmacy is open so you can pick up prescription now. If there is a problem, you may contact your  provider through [employer] and have the prescription routed to another pharmacy.  Your safety is important to [place]. If you have drug allergies check your prescription carefully.   For the next 24 hours you can use MyChart to ask questions about today's visit, request a non-urgent call back, or ask for a work or school excuse. You will get an email in the next two days asking about your experience. I hope that your e-visit has been valuable and will speed your recovery.   Approximately 5 minutes was spent documenting and reviewing patient's chart.

## 2022-04-08 ENCOUNTER — Other Ambulatory Visit (HOSPITAL_COMMUNITY): Payer: Self-pay

## 2022-04-14 DIAGNOSIS — U071 COVID-19: Secondary | ICD-10-CM | POA: Diagnosis not present

## 2022-04-14 DIAGNOSIS — J329 Chronic sinusitis, unspecified: Secondary | ICD-10-CM | POA: Diagnosis not present

## 2022-04-25 ENCOUNTER — Other Ambulatory Visit (HOSPITAL_COMMUNITY): Payer: Self-pay

## 2022-04-29 DIAGNOSIS — B3731 Acute candidiasis of vulva and vagina: Secondary | ICD-10-CM | POA: Diagnosis not present

## 2022-04-29 DIAGNOSIS — R7303 Prediabetes: Secondary | ICD-10-CM | POA: Diagnosis not present

## 2022-04-29 DIAGNOSIS — I10 Essential (primary) hypertension: Secondary | ICD-10-CM | POA: Diagnosis not present

## 2022-04-29 DIAGNOSIS — Z79899 Other long term (current) drug therapy: Secondary | ICD-10-CM | POA: Diagnosis not present

## 2022-04-29 DIAGNOSIS — E785 Hyperlipidemia, unspecified: Secondary | ICD-10-CM | POA: Diagnosis not present

## 2022-04-29 DIAGNOSIS — E039 Hypothyroidism, unspecified: Secondary | ICD-10-CM | POA: Diagnosis not present

## 2022-04-29 DIAGNOSIS — Z6826 Body mass index (BMI) 26.0-26.9, adult: Secondary | ICD-10-CM | POA: Diagnosis not present

## 2022-05-01 ENCOUNTER — Other Ambulatory Visit (HOSPITAL_COMMUNITY): Payer: Self-pay

## 2022-05-01 MED ORDER — FLUCONAZOLE 200 MG PO TABS
ORAL_TABLET | ORAL | 3 refills | Status: DC
  Filled 2022-05-01: qty 2, 4d supply, fill #0
  Filled 2022-05-26: qty 2, 4d supply, fill #1
  Filled 2022-06-10: qty 2, 4d supply, fill #2
  Filled 2022-07-04: qty 2, 4d supply, fill #3

## 2022-05-02 ENCOUNTER — Other Ambulatory Visit (HOSPITAL_COMMUNITY): Payer: Self-pay

## 2022-05-02 MED ORDER — LEVOTHYROXINE SODIUM 112 MCG PO TABS
112.0000 ug | ORAL_TABLET | ORAL | 1 refills | Status: DC
  Filled 2022-05-02: qty 90, 90d supply, fill #0

## 2022-05-02 MED ORDER — FUROSEMIDE 20 MG PO TABS
20.0000 mg | ORAL_TABLET | Freq: Every day | ORAL | 1 refills | Status: DC | PRN
  Filled 2022-05-02: qty 90, 90d supply, fill #0
  Filled 2022-08-04: qty 90, 90d supply, fill #1

## 2022-05-02 MED ORDER — ESOMEPRAZOLE MAGNESIUM 40 MG PO CPDR
40.0000 mg | DELAYED_RELEASE_CAPSULE | Freq: Every day | ORAL | 2 refills | Status: AC
  Filled 2022-05-02 – 2023-03-25 (×2): qty 90, 90d supply, fill #0

## 2022-05-02 MED ORDER — CLINDAMYCIN HCL 150 MG PO CAPS
150.0000 mg | ORAL_CAPSULE | Freq: Every day | ORAL | 4 refills | Status: AC
  Filled 2022-05-02: qty 30, 30d supply, fill #0
  Filled 2022-08-04: qty 30, 30d supply, fill #1
  Filled 2022-08-30: qty 30, 30d supply, fill #2
  Filled 2022-09-30: qty 30, 30d supply, fill #3
  Filled 2022-10-19: qty 30, 30d supply, fill #4

## 2022-05-02 MED ORDER — PITAVASTATIN CALCIUM 4 MG PO TABS: 4.0000 mg | ORAL_TABLET | Freq: Every day | ORAL | 2 refills | Status: AC

## 2022-05-02 MED ORDER — PREMARIN 0.625 MG/GM VA CREA
1.0000 | TOPICAL_CREAM | VAGINAL | 3 refills | Status: AC
  Filled 2022-05-02: qty 30, 30d supply, fill #0
  Filled 2022-08-30: qty 30, 52d supply, fill #0

## 2022-05-02 MED ORDER — CYANOCOBALAMIN 1000 MCG/ML IJ SOLN
1000.0000 ug | INTRAMUSCULAR | 0 refills | Status: AC
  Filled 2022-05-02 – 2022-05-26 (×2): qty 4, 28d supply, fill #0

## 2022-05-02 MED ORDER — LOSARTAN POTASSIUM-HCTZ 50-12.5 MG PO TABS
1.0000 | ORAL_TABLET | Freq: Every day | ORAL | 1 refills | Status: AC
  Filled 2022-05-02: qty 90, 90d supply, fill #0

## 2022-05-02 MED ORDER — VALACYCLOVIR HCL 500 MG PO TABS
500.0000 mg | ORAL_TABLET | Freq: Every day | ORAL | 2 refills | Status: AC
  Filled 2022-05-02 – 2022-08-30 (×2): qty 90, 90d supply, fill #0

## 2022-05-05 ENCOUNTER — Other Ambulatory Visit (HOSPITAL_COMMUNITY): Payer: Self-pay

## 2022-05-06 ENCOUNTER — Other Ambulatory Visit (HOSPITAL_COMMUNITY): Payer: Self-pay

## 2022-05-07 ENCOUNTER — Other Ambulatory Visit (HOSPITAL_COMMUNITY): Payer: Self-pay

## 2022-05-09 ENCOUNTER — Other Ambulatory Visit (HOSPITAL_COMMUNITY): Payer: Self-pay

## 2022-05-12 ENCOUNTER — Other Ambulatory Visit (HOSPITAL_COMMUNITY): Payer: Self-pay

## 2022-05-12 MED ORDER — SYNTHROID 112 MCG PO TABS
112.0000 ug | ORAL_TABLET | Freq: Every morning | ORAL | 1 refills | Status: AC
  Filled 2022-05-12 (×2): qty 90, 90d supply, fill #0
  Filled 2022-08-30: qty 90, 90d supply, fill #1

## 2022-05-12 MED ORDER — CLINDAMYCIN HCL 300 MG PO CAPS
300.0000 mg | ORAL_CAPSULE | Freq: Four times a day (QID) | ORAL | 0 refills | Status: DC
  Filled 2022-05-12: qty 120, 30d supply, fill #0

## 2022-05-13 ENCOUNTER — Other Ambulatory Visit (HOSPITAL_COMMUNITY): Payer: Self-pay

## 2022-05-14 ENCOUNTER — Other Ambulatory Visit (HOSPITAL_COMMUNITY): Payer: Self-pay

## 2022-05-26 ENCOUNTER — Other Ambulatory Visit (HOSPITAL_COMMUNITY): Payer: Self-pay

## 2022-06-05 ENCOUNTER — Ambulatory Visit: Payer: 59 | Admitting: Internal Medicine

## 2022-06-10 ENCOUNTER — Other Ambulatory Visit (HOSPITAL_COMMUNITY): Payer: Self-pay

## 2022-06-10 ENCOUNTER — Other Ambulatory Visit: Payer: Self-pay

## 2022-06-14 ENCOUNTER — Other Ambulatory Visit (HOSPITAL_COMMUNITY): Payer: Self-pay

## 2022-07-04 ENCOUNTER — Other Ambulatory Visit: Payer: Self-pay

## 2022-07-04 ENCOUNTER — Other Ambulatory Visit (HOSPITAL_COMMUNITY): Payer: Self-pay

## 2022-07-07 ENCOUNTER — Other Ambulatory Visit (HOSPITAL_COMMUNITY): Payer: Self-pay

## 2022-07-07 MED ORDER — CYANOCOBALAMIN 1000 MCG/ML IJ SOLN
1000.0000 ug | INTRAMUSCULAR | 0 refills | Status: DC
  Filled 2022-07-07: qty 4, 28d supply, fill #0

## 2022-07-15 ENCOUNTER — Other Ambulatory Visit (HOSPITAL_COMMUNITY): Payer: Self-pay

## 2022-07-16 ENCOUNTER — Other Ambulatory Visit (HOSPITAL_COMMUNITY): Payer: Self-pay

## 2022-07-16 ENCOUNTER — Other Ambulatory Visit: Payer: Self-pay

## 2022-07-16 MED ORDER — CLINDAMYCIN HCL 300 MG PO CAPS
300.0000 mg | ORAL_CAPSULE | Freq: Four times a day (QID) | ORAL | 0 refills | Status: DC
  Filled 2022-07-16: qty 90, 22d supply, fill #0
  Filled 2022-07-16: qty 30, 8d supply, fill #0

## 2022-07-17 ENCOUNTER — Other Ambulatory Visit: Payer: Self-pay

## 2022-07-31 DIAGNOSIS — Z22322 Carrier or suspected carrier of Methicillin resistant Staphylococcus aureus: Secondary | ICD-10-CM | POA: Diagnosis not present

## 2022-07-31 DIAGNOSIS — R7303 Prediabetes: Secondary | ICD-10-CM | POA: Diagnosis not present

## 2022-07-31 DIAGNOSIS — E2839 Other primary ovarian failure: Secondary | ICD-10-CM | POA: Diagnosis not present

## 2022-07-31 DIAGNOSIS — E039 Hypothyroidism, unspecified: Secondary | ICD-10-CM | POA: Diagnosis not present

## 2022-07-31 DIAGNOSIS — B009 Herpesviral infection, unspecified: Secondary | ICD-10-CM | POA: Diagnosis not present

## 2022-07-31 DIAGNOSIS — E785 Hyperlipidemia, unspecified: Secondary | ICD-10-CM | POA: Diagnosis not present

## 2022-07-31 DIAGNOSIS — K219 Gastro-esophageal reflux disease without esophagitis: Secondary | ICD-10-CM | POA: Diagnosis not present

## 2022-07-31 DIAGNOSIS — I10 Essential (primary) hypertension: Secondary | ICD-10-CM | POA: Diagnosis not present

## 2022-07-31 DIAGNOSIS — J329 Chronic sinusitis, unspecified: Secondary | ICD-10-CM | POA: Diagnosis not present

## 2022-07-31 DIAGNOSIS — Z6823 Body mass index (BMI) 23.0-23.9, adult: Secondary | ICD-10-CM | POA: Diagnosis not present

## 2022-08-04 ENCOUNTER — Other Ambulatory Visit (HOSPITAL_COMMUNITY): Payer: Self-pay

## 2022-08-05 ENCOUNTER — Other Ambulatory Visit (HOSPITAL_COMMUNITY): Payer: Self-pay

## 2022-08-05 MED ORDER — PITAVASTATIN CALCIUM 4 MG PO TABS
4.0000 mg | ORAL_TABLET | Freq: Every day | ORAL | 1 refills | Status: DC
  Filled 2022-08-05 – 2022-08-06 (×2): qty 90, 90d supply, fill #0
  Filled 2023-08-01: qty 90, 90d supply, fill #1

## 2022-08-05 MED ORDER — FLUCONAZOLE 200 MG PO TABS
200.0000 mg | ORAL_TABLET | Freq: Once | ORAL | 3 refills | Status: AC
  Filled 2022-08-05: qty 2, 3d supply, fill #0
  Filled 2022-08-08: qty 2, 2d supply, fill #0
  Filled 2022-08-11: qty 2, 4d supply, fill #0
  Filled 2022-09-30: qty 2, 4d supply, fill #1
  Filled 2023-04-22: qty 2, 3d supply, fill #2

## 2022-08-05 MED ORDER — CLARITHROMYCIN 500 MG PO TABS
500.0000 mg | ORAL_TABLET | Freq: Two times a day (BID) | ORAL | 0 refills | Status: DC
  Filled 2022-08-05 – 2022-08-06 (×2): qty 20, 10d supply, fill #0

## 2022-08-05 MED ORDER — LOSARTAN POTASSIUM-HCTZ 50-12.5 MG PO TABS
1.0000 | ORAL_TABLET | Freq: Every day | ORAL | 1 refills | Status: DC
  Filled 2022-08-05 – 2022-08-06 (×2): qty 90, 90d supply, fill #0
  Filled 2023-08-01: qty 90, 90d supply, fill #1

## 2022-08-05 MED ORDER — ALPRAZOLAM 0.5 MG PO TABS
0.5000 mg | ORAL_TABLET | Freq: Two times a day (BID) | ORAL | 3 refills | Status: DC | PRN
  Filled 2022-08-05 – 2022-08-06 (×2): qty 60, 30d supply, fill #0
  Filled 2022-09-23: qty 60, 30d supply, fill #1

## 2022-08-05 MED ORDER — ESOMEPRAZOLE MAGNESIUM 40 MG PO CPDR
40.0000 mg | DELAYED_RELEASE_CAPSULE | Freq: Every day | ORAL | 1 refills | Status: DC
  Filled 2022-08-05 – 2022-08-06 (×2): qty 90, 90d supply, fill #0
  Filled 2023-05-25 – 2023-06-25 (×2): qty 90, 90d supply, fill #1

## 2022-08-05 MED ORDER — FUROSEMIDE 20 MG PO TABS
20.0000 mg | ORAL_TABLET | Freq: Every day | ORAL | 1 refills | Status: AC | PRN
  Filled 2022-08-05 – 2023-01-28 (×3): qty 90, 90d supply, fill #0
  Filled 2023-04-22: qty 90, 90d supply, fill #1

## 2022-08-05 MED ORDER — CYANOCOBALAMIN 1000 MCG/ML IJ SOLN
1000.0000 ug | INTRAMUSCULAR | 0 refills | Status: DC
  Filled 2022-08-05 – 2022-08-06 (×2): qty 4, 28d supply, fill #0

## 2022-08-05 MED ORDER — FLUTICASONE PROPIONATE 50 MCG/ACT NA SUSP
1.0000 | Freq: Two times a day (BID) | NASAL | 3 refills | Status: DC
  Filled 2022-08-05 – 2022-08-06 (×2): qty 16, 30d supply, fill #0
  Filled 2022-08-30: qty 16, 30d supply, fill #1

## 2022-08-05 MED ORDER — ZOLPIDEM TARTRATE 10 MG PO TABS
10.0000 mg | ORAL_TABLET | Freq: Every evening | ORAL | 5 refills | Status: DC | PRN
  Filled 2022-08-05 – 2022-08-22 (×4): qty 30, 30d supply, fill #0
  Filled 2022-09-23: qty 30, 30d supply, fill #1
  Filled 2022-10-27: qty 30, 30d supply, fill #2

## 2022-08-05 MED ORDER — SYNTHROID 112 MCG PO TABS
112.0000 ug | ORAL_TABLET | Freq: Every day | ORAL | 1 refills | Status: DC
  Filled 2022-08-05: qty 90, 90d supply, fill #0
  Filled 2023-05-25: qty 90, 90d supply, fill #1

## 2022-08-06 ENCOUNTER — Other Ambulatory Visit (HOSPITAL_COMMUNITY): Payer: Self-pay

## 2022-08-06 ENCOUNTER — Other Ambulatory Visit: Payer: Self-pay

## 2022-08-06 MED ORDER — PREMARIN 0.625 MG/GM VA CREA
TOPICAL_CREAM | VAGINAL | 5 refills | Status: DC
  Filled 2022-08-06 – 2022-08-25 (×2): qty 30, 90d supply, fill #0

## 2022-08-07 ENCOUNTER — Other Ambulatory Visit (HOSPITAL_COMMUNITY): Payer: Self-pay

## 2022-08-08 ENCOUNTER — Other Ambulatory Visit: Payer: Self-pay

## 2022-08-08 ENCOUNTER — Other Ambulatory Visit (HOSPITAL_COMMUNITY): Payer: Self-pay

## 2022-08-09 ENCOUNTER — Other Ambulatory Visit (HOSPITAL_COMMUNITY): Payer: Self-pay

## 2022-08-11 ENCOUNTER — Other Ambulatory Visit: Payer: Self-pay

## 2022-08-11 ENCOUNTER — Other Ambulatory Visit (HOSPITAL_COMMUNITY): Payer: Self-pay

## 2022-08-12 ENCOUNTER — Other Ambulatory Visit: Payer: Self-pay

## 2022-08-22 ENCOUNTER — Other Ambulatory Visit (HOSPITAL_COMMUNITY): Payer: Self-pay

## 2022-08-25 ENCOUNTER — Other Ambulatory Visit: Payer: Self-pay

## 2022-08-30 ENCOUNTER — Other Ambulatory Visit (HOSPITAL_COMMUNITY): Payer: Self-pay

## 2022-09-01 ENCOUNTER — Other Ambulatory Visit: Payer: Self-pay

## 2022-09-02 ENCOUNTER — Other Ambulatory Visit (HOSPITAL_COMMUNITY): Payer: Self-pay

## 2022-09-11 ENCOUNTER — Other Ambulatory Visit (HOSPITAL_COMMUNITY): Payer: Self-pay

## 2022-09-16 DIAGNOSIS — C44729 Squamous cell carcinoma of skin of left lower limb, including hip: Secondary | ICD-10-CM | POA: Diagnosis not present

## 2022-09-16 DIAGNOSIS — L570 Actinic keratosis: Secondary | ICD-10-CM | POA: Diagnosis not present

## 2022-09-16 DIAGNOSIS — L820 Inflamed seborrheic keratosis: Secondary | ICD-10-CM | POA: Diagnosis not present

## 2022-09-16 DIAGNOSIS — L814 Other melanin hyperpigmentation: Secondary | ICD-10-CM | POA: Diagnosis not present

## 2022-09-16 DIAGNOSIS — Z85828 Personal history of other malignant neoplasm of skin: Secondary | ICD-10-CM | POA: Diagnosis not present

## 2022-09-16 DIAGNOSIS — L309 Dermatitis, unspecified: Secondary | ICD-10-CM | POA: Diagnosis not present

## 2022-09-23 ENCOUNTER — Other Ambulatory Visit (HOSPITAL_COMMUNITY): Payer: Self-pay

## 2022-09-23 ENCOUNTER — Other Ambulatory Visit: Payer: Self-pay

## 2022-09-30 ENCOUNTER — Other Ambulatory Visit (HOSPITAL_COMMUNITY): Payer: Self-pay

## 2022-09-30 ENCOUNTER — Other Ambulatory Visit: Payer: Self-pay

## 2022-10-20 ENCOUNTER — Other Ambulatory Visit (HOSPITAL_COMMUNITY): Payer: Self-pay

## 2022-10-27 ENCOUNTER — Other Ambulatory Visit: Payer: Self-pay

## 2022-10-27 ENCOUNTER — Other Ambulatory Visit (HOSPITAL_COMMUNITY): Payer: Self-pay

## 2022-10-30 ENCOUNTER — Other Ambulatory Visit (HOSPITAL_COMMUNITY): Payer: Self-pay

## 2022-10-30 ENCOUNTER — Other Ambulatory Visit: Payer: Self-pay

## 2022-10-30 DIAGNOSIS — G4700 Insomnia, unspecified: Secondary | ICD-10-CM | POA: Diagnosis not present

## 2022-10-30 DIAGNOSIS — E785 Hyperlipidemia, unspecified: Secondary | ICD-10-CM | POA: Diagnosis not present

## 2022-10-30 DIAGNOSIS — I10 Essential (primary) hypertension: Secondary | ICD-10-CM | POA: Diagnosis not present

## 2022-10-30 DIAGNOSIS — E039 Hypothyroidism, unspecified: Secondary | ICD-10-CM | POA: Diagnosis not present

## 2022-10-30 DIAGNOSIS — Z79899 Other long term (current) drug therapy: Secondary | ICD-10-CM | POA: Diagnosis not present

## 2022-10-30 DIAGNOSIS — E2839 Other primary ovarian failure: Secondary | ICD-10-CM | POA: Diagnosis not present

## 2022-10-30 DIAGNOSIS — J329 Chronic sinusitis, unspecified: Secondary | ICD-10-CM | POA: Diagnosis not present

## 2022-10-30 DIAGNOSIS — R7303 Prediabetes: Secondary | ICD-10-CM | POA: Diagnosis not present

## 2022-10-30 DIAGNOSIS — B009 Herpesviral infection, unspecified: Secondary | ICD-10-CM | POA: Diagnosis not present

## 2022-10-30 DIAGNOSIS — F418 Other specified anxiety disorders: Secondary | ICD-10-CM | POA: Diagnosis not present

## 2022-10-30 MED ORDER — PREMARIN 0.625 MG/GM VA CREA
TOPICAL_CREAM | VAGINAL | 5 refills | Status: AC
  Filled 2022-10-30: qty 30, 90d supply, fill #0

## 2022-10-30 MED ORDER — PITAVASTATIN CALCIUM 4 MG PO TABS
4.0000 mg | ORAL_TABLET | Freq: Every day | ORAL | 1 refills | Status: DC
  Filled 2022-10-30: qty 90, 90d supply, fill #0
  Filled 2023-10-27: qty 90, 90d supply, fill #1

## 2022-10-30 MED ORDER — FUROSEMIDE 20 MG PO TABS
20.0000 mg | ORAL_TABLET | Freq: Every day | ORAL | 1 refills | Status: DC | PRN
  Filled 2022-10-30: qty 90, 90d supply, fill #0
  Filled 2023-03-25 – 2023-06-15 (×3): qty 90, 90d supply, fill #1

## 2022-10-30 MED ORDER — FLUTICASONE PROPIONATE 50 MCG/ACT NA SUSP
1.0000 | Freq: Two times a day (BID) | NASAL | 3 refills | Status: DC
  Filled 2022-10-30: qty 16, 30d supply, fill #0

## 2022-10-30 MED ORDER — VALACYCLOVIR HCL 500 MG PO TABS
500.0000 mg | ORAL_TABLET | Freq: Every day | ORAL | 1 refills | Status: AC
  Filled 2023-02-02: qty 90, 90d supply, fill #0
  Filled 2023-05-25: qty 90, 90d supply, fill #1

## 2022-10-30 MED ORDER — LOSARTAN POTASSIUM-HCTZ 50-12.5 MG PO TABS
1.0000 | ORAL_TABLET | Freq: Every day | ORAL | 1 refills | Status: AC
  Filled 2022-10-30: qty 90, 90d supply, fill #0
  Filled 2023-06-15: qty 90, 90d supply, fill #1

## 2022-10-30 MED ORDER — FLUCONAZOLE 200 MG PO TABS
ORAL_TABLET | ORAL | 3 refills | Status: DC
  Filled 2022-10-30: qty 2, 3d supply, fill #0
  Filled 2022-11-16 – 2022-11-21 (×2): qty 2, 3d supply, fill #1
  Filled 2022-12-23 (×2): qty 2, 3d supply, fill #2

## 2022-10-30 MED ORDER — SYNTHROID 112 MCG PO TABS
112.0000 ug | ORAL_TABLET | Freq: Every morning | ORAL | 1 refills | Status: AC
  Filled 2022-10-30 – 2023-04-22 (×2): qty 90, 90d supply, fill #0

## 2022-10-31 ENCOUNTER — Other Ambulatory Visit (HOSPITAL_COMMUNITY): Payer: Self-pay

## 2022-10-31 ENCOUNTER — Other Ambulatory Visit: Payer: Self-pay

## 2022-11-05 ENCOUNTER — Other Ambulatory Visit (HOSPITAL_COMMUNITY): Payer: Self-pay

## 2022-11-05 MED ORDER — ZOLPIDEM TARTRATE 10 MG PO TABS
10.0000 mg | ORAL_TABLET | Freq: Every evening | ORAL | 2 refills | Status: DC | PRN
  Filled 2022-11-24: qty 30, 30d supply, fill #0
  Filled 2022-12-23 (×2): qty 30, 30d supply, fill #1
  Filled 2023-01-22: qty 30, 30d supply, fill #2

## 2022-11-05 MED ORDER — ALPRAZOLAM 0.5 MG PO TABS
0.5000 mg | ORAL_TABLET | Freq: Two times a day (BID) | ORAL | 2 refills | Status: AC | PRN
  Filled 2022-11-05: qty 60, 30d supply, fill #0
  Filled 2022-12-23 (×2): qty 60, 30d supply, fill #1

## 2022-11-17 ENCOUNTER — Other Ambulatory Visit (HOSPITAL_COMMUNITY): Payer: Self-pay

## 2022-11-17 ENCOUNTER — Other Ambulatory Visit: Payer: Self-pay

## 2022-11-18 ENCOUNTER — Encounter: Payer: Self-pay | Admitting: Pharmacist

## 2022-11-18 ENCOUNTER — Other Ambulatory Visit: Payer: Self-pay

## 2022-11-21 ENCOUNTER — Other Ambulatory Visit (HOSPITAL_COMMUNITY): Payer: Self-pay

## 2022-11-21 ENCOUNTER — Other Ambulatory Visit: Payer: Self-pay

## 2022-11-24 ENCOUNTER — Other Ambulatory Visit: Payer: Self-pay

## 2022-11-27 ENCOUNTER — Other Ambulatory Visit (HOSPITAL_COMMUNITY): Payer: Self-pay

## 2022-11-27 MED ORDER — CYANOCOBALAMIN 1000 MCG/ML IJ SOLN
1000.0000 ug | INTRAMUSCULAR | 0 refills | Status: DC
  Filled 2022-11-27 – 2023-01-28 (×2): qty 4, 28d supply, fill #0

## 2022-12-02 ENCOUNTER — Other Ambulatory Visit (HOSPITAL_COMMUNITY): Payer: Self-pay | Admitting: Physician Assistant

## 2022-12-02 DIAGNOSIS — J329 Chronic sinusitis, unspecified: Secondary | ICD-10-CM

## 2022-12-02 DIAGNOSIS — R519 Headache, unspecified: Secondary | ICD-10-CM | POA: Diagnosis not present

## 2022-12-02 DIAGNOSIS — Z9889 Other specified postprocedural states: Secondary | ICD-10-CM | POA: Diagnosis not present

## 2022-12-11 ENCOUNTER — Other Ambulatory Visit (HOSPITAL_COMMUNITY): Payer: Self-pay

## 2022-12-17 IMAGING — DX DG PORTABLE PELVIS
1 series · 1 of 1 positions shown · non-contrast
Comparison: Portable exam 9596 hours compared to 10/26/2018

CLINICAL DATA: Post RIGHT total hip arthroplasty

EXAM:
PORTABLE PELVIS 1-2 VIEWS

[pelvis ap]
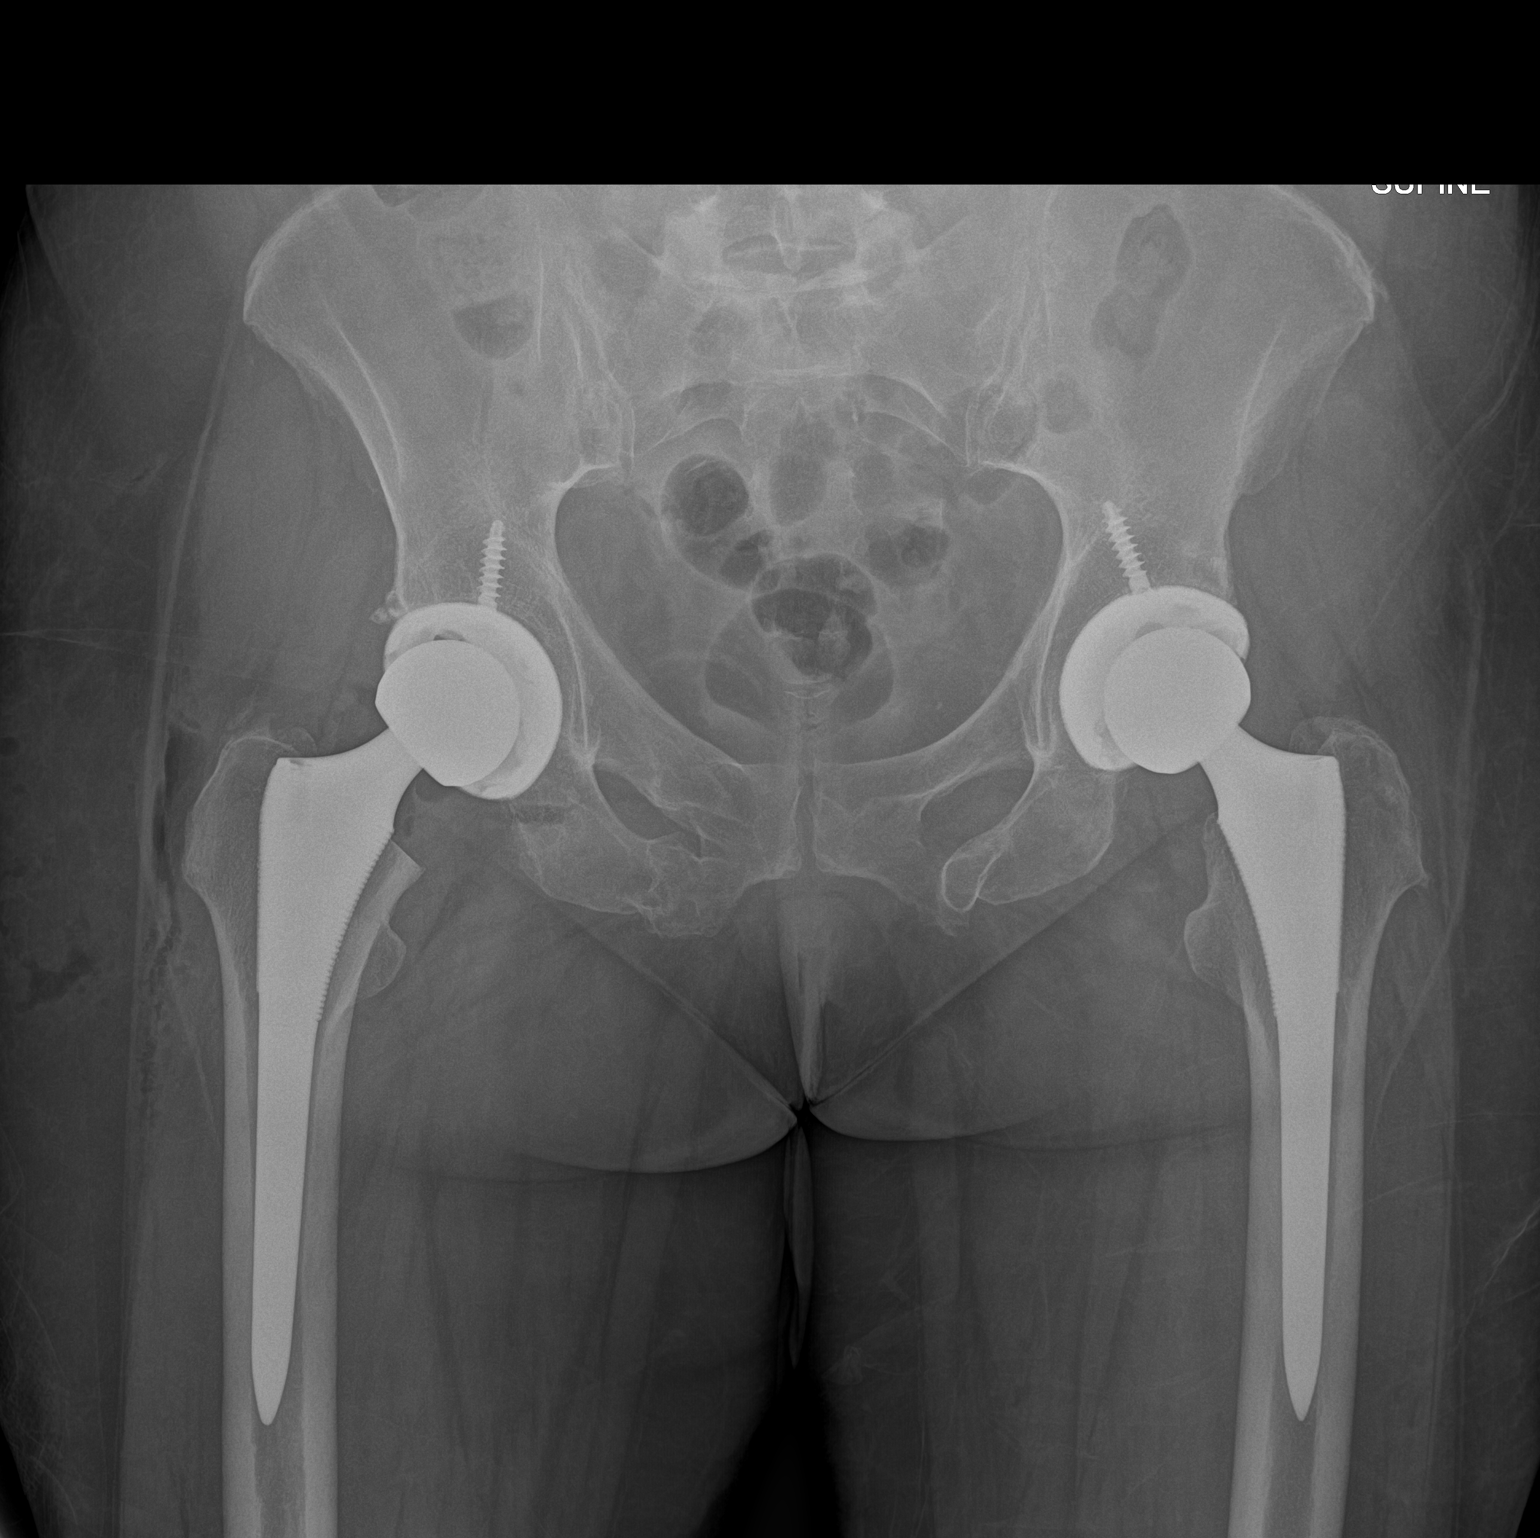

[1 of 1 positions shown; findings below may reference images not displayed]

FINDINGS: BILATERAL hip prostheses are now identified, new on RIGHT.

No fracture or dislocation seen on single AP view.

Old fractures of the inferior pubic rami bilaterally.

Bones appear demineralized.

SI joints preserved.
IMPRESSION: New RIGHT hip prosthesis without acute complication.

## 2022-12-23 ENCOUNTER — Ambulatory Visit (HOSPITAL_COMMUNITY)
Admission: RE | Admit: 2022-12-23 | Discharge: 2022-12-23 | Disposition: A | Discharge status: Home / Self Care | Payer: Commercial Managed Care - PPO | Source: Ambulatory Visit | Attending: Physician Assistant | Admitting: Physician Assistant

## 2022-12-23 ENCOUNTER — Other Ambulatory Visit (HOSPITAL_COMMUNITY): Payer: Self-pay

## 2022-12-23 ENCOUNTER — Other Ambulatory Visit: Payer: Self-pay

## 2022-12-23 DIAGNOSIS — J329 Chronic sinusitis, unspecified: Secondary | ICD-10-CM | POA: Insufficient documentation

## 2022-12-23 MED ORDER — IOHEXOL 350 MG/ML SOLN
75.0000 mL | Freq: Once | INTRAVENOUS | Status: AC | PRN
  Administered 2022-12-23: 75 mL via INTRAVENOUS

## 2022-12-31 ENCOUNTER — Other Ambulatory Visit (HOSPITAL_COMMUNITY): Payer: Self-pay

## 2023-01-01 ENCOUNTER — Other Ambulatory Visit: Payer: Self-pay

## 2023-01-08 ENCOUNTER — Other Ambulatory Visit (HOSPITAL_COMMUNITY): Payer: Self-pay

## 2023-01-09 ENCOUNTER — Other Ambulatory Visit: Payer: Self-pay

## 2023-01-09 ENCOUNTER — Other Ambulatory Visit (HOSPITAL_COMMUNITY): Payer: Self-pay

## 2023-01-09 MED ORDER — POLYCIN 500-10000 UNIT/GM OP OINT
TOPICAL_OINTMENT | OPHTHALMIC | 1 refills | Status: DC
  Filled 2023-01-09: qty 3.5, 10d supply, fill #0
  Filled 2023-05-25: qty 3.5, 10d supply, fill #1

## 2023-01-15 DIAGNOSIS — U071 COVID-19: Secondary | ICD-10-CM | POA: Diagnosis not present

## 2023-01-15 DIAGNOSIS — Z6821 Body mass index (BMI) 21.0-21.9, adult: Secondary | ICD-10-CM | POA: Diagnosis not present

## 2023-01-15 DIAGNOSIS — J329 Chronic sinusitis, unspecified: Secondary | ICD-10-CM | POA: Diagnosis not present

## 2023-01-22 ENCOUNTER — Other Ambulatory Visit (HOSPITAL_COMMUNITY): Payer: Self-pay

## 2023-01-22 ENCOUNTER — Other Ambulatory Visit: Payer: Self-pay

## 2023-01-23 ENCOUNTER — Telehealth: Payer: Self-pay | Admitting: Pharmacy Technician

## 2023-01-23 ENCOUNTER — Other Ambulatory Visit (HOSPITAL_COMMUNITY): Payer: Self-pay

## 2023-01-23 NOTE — Telephone Encounter (Signed)
Pharmacy Patient Advocate Encounter   Received notification from CoverMyMeds that prior authorization for Repatha Sureclick 140mg /ml is required/requested.   Insurance verification completed.   The patient is insured through [facility] .   Per test claim: The current 02/12/2023 day co-pay is, $74.97.  No PA needed at this time. This test claim was processed through [facility]- copay amounts may vary at other pharmacies due to pharmacy/plan contracts, or as the patient moves through the different stages of their insurance plan.

## 2023-01-28 ENCOUNTER — Other Ambulatory Visit (HOSPITAL_COMMUNITY): Payer: Self-pay

## 2023-01-28 DIAGNOSIS — Z01419 Encounter for gynecological examination (general) (routine) without abnormal findings: Secondary | ICD-10-CM | POA: Diagnosis not present

## 2023-01-28 DIAGNOSIS — Z1231 Encounter for screening mammogram for malignant neoplasm of breast: Secondary | ICD-10-CM | POA: Diagnosis not present

## 2023-01-28 DIAGNOSIS — Z79899 Other long term (current) drug therapy: Secondary | ICD-10-CM | POA: Diagnosis not present

## 2023-01-28 DIAGNOSIS — M461 Sacroiliitis, not elsewhere classified: Secondary | ICD-10-CM | POA: Diagnosis not present

## 2023-01-28 DIAGNOSIS — Z6822 Body mass index (BMI) 22.0-22.9, adult: Secondary | ICD-10-CM | POA: Diagnosis not present

## 2023-01-28 DIAGNOSIS — E782 Mixed hyperlipidemia: Secondary | ICD-10-CM | POA: Diagnosis not present

## 2023-01-28 DIAGNOSIS — N760 Acute vaginitis: Secondary | ICD-10-CM | POA: Diagnosis not present

## 2023-01-28 DIAGNOSIS — E039 Hypothyroidism, unspecified: Secondary | ICD-10-CM | POA: Diagnosis not present

## 2023-01-28 DIAGNOSIS — R7303 Prediabetes: Secondary | ICD-10-CM | POA: Diagnosis not present

## 2023-01-28 DIAGNOSIS — I10 Essential (primary) hypertension: Secondary | ICD-10-CM | POA: Diagnosis not present

## 2023-01-28 DIAGNOSIS — F411 Generalized anxiety disorder: Secondary | ICD-10-CM | POA: Diagnosis not present

## 2023-01-28 DIAGNOSIS — G4709 Other insomnia: Secondary | ICD-10-CM | POA: Diagnosis not present

## 2023-01-28 MED ORDER — PREDNISONE 10 MG PO TABS
ORAL_TABLET | ORAL | 0 refills | Status: AC
  Filled 2023-01-28: qty 40, 14d supply, fill #0

## 2023-01-28 MED ORDER — SYNTHROID 112 MCG PO TABS
112.0000 ug | ORAL_TABLET | Freq: Every morning | ORAL | 1 refills | Status: DC
  Filled 2023-01-28: qty 90, 90d supply, fill #0
  Filled 2023-08-01 – 2023-08-18 (×2): qty 90, 90d supply, fill #1

## 2023-01-28 MED ORDER — PITAVASTATIN CALCIUM 4 MG PO TABS
4.0000 mg | ORAL_TABLET | Freq: Every day | ORAL | 1 refills | Status: DC
  Filled 2023-01-28: qty 90, 90d supply, fill #0
  Filled 2023-04-22: qty 90, 90d supply, fill #1

## 2023-01-28 MED ORDER — LOSARTAN POTASSIUM-HCTZ 50-12.5 MG PO TABS
1.0000 | ORAL_TABLET | Freq: Every day | ORAL | 1 refills | Status: DC
  Filled 2023-01-28: qty 90, 90d supply, fill #0
  Filled 2023-04-22: qty 90, 90d supply, fill #1

## 2023-01-29 ENCOUNTER — Other Ambulatory Visit: Payer: Self-pay

## 2023-01-29 ENCOUNTER — Other Ambulatory Visit (HOSPITAL_COMMUNITY): Payer: Self-pay

## 2023-01-29 MED ORDER — FLUCONAZOLE 150 MG PO TABS
ORAL_TABLET | ORAL | 0 refills | Status: DC
  Filled 2023-01-29: qty 2, 3d supply, fill #0

## 2023-01-29 MED ORDER — PREMARIN 0.625 MG/GM VA CREA
0.5000 | TOPICAL_CREAM | VAGINAL | 2 refills | Status: DC
  Filled 2023-01-29: qty 30, 105d supply, fill #0
  Filled 2023-04-24: qty 30, 90d supply, fill #0
  Filled 2023-08-01: qty 30, 90d supply, fill #1
  Filled 2023-12-09: qty 30, 90d supply, fill #2

## 2023-01-29 MED ORDER — METRONIDAZOLE 500 MG PO TABS
500.0000 mg | ORAL_TABLET | Freq: Two times a day (BID) | ORAL | 0 refills | Status: DC
  Filled 2023-01-29: qty 14, 7d supply, fill #0

## 2023-02-02 ENCOUNTER — Other Ambulatory Visit (HOSPITAL_COMMUNITY): Payer: Self-pay

## 2023-02-02 ENCOUNTER — Other Ambulatory Visit: Payer: Self-pay

## 2023-02-03 ENCOUNTER — Other Ambulatory Visit (HOSPITAL_COMMUNITY): Payer: Self-pay

## 2023-02-03 MED ORDER — VALACYCLOVIR HCL 500 MG PO TABS
500.0000 mg | ORAL_TABLET | Freq: Every day | ORAL | 1 refills | Status: AC
  Filled 2023-02-03 – 2023-08-18 (×3): qty 90, 90d supply, fill #0
  Filled 2023-09-09 – 2023-12-09 (×3): qty 90, 90d supply, fill #1

## 2023-02-04 ENCOUNTER — Other Ambulatory Visit (HOSPITAL_COMMUNITY): Payer: Self-pay

## 2023-02-04 MED ORDER — ALPRAZOLAM 0.5 MG PO TABS
0.5000 mg | ORAL_TABLET | Freq: Two times a day (BID) | ORAL | 5 refills | Status: DC | PRN
  Filled 2023-02-04 – 2023-02-23 (×2): qty 30, 15d supply, fill #0
  Filled 2023-03-25: qty 30, 15d supply, fill #1
  Filled 2023-04-22: qty 30, 15d supply, fill #2
  Filled 2023-05-25: qty 30, 30d supply, fill #0
  Filled 2023-06-25: qty 30, 30d supply, fill #1
  Filled 2023-07-28: qty 30, 15d supply, fill #2

## 2023-02-04 MED ORDER — ZOLPIDEM TARTRATE 10 MG PO TABS
10.0000 mg | ORAL_TABLET | Freq: Every evening | ORAL | 5 refills | Status: DC | PRN
Start: 2023-02-04 — End: 2023-08-14
  Filled 2023-02-04 – 2023-02-23 (×2): qty 30, 30d supply, fill #0
  Filled 2023-03-25: qty 30, 30d supply, fill #1
  Filled 2023-04-22: qty 30, 30d supply, fill #2
  Filled 2023-05-25: qty 30, 30d supply, fill #0
  Filled 2023-06-25: qty 30, 30d supply, fill #1
  Filled 2023-08-01: qty 30, 30d supply, fill #2

## 2023-02-12 DIAGNOSIS — M25559 Pain in unspecified hip: Secondary | ICD-10-CM | POA: Diagnosis not present

## 2023-02-12 DIAGNOSIS — M25551 Pain in right hip: Secondary | ICD-10-CM | POA: Diagnosis not present

## 2023-02-12 DIAGNOSIS — M25552 Pain in left hip: Secondary | ICD-10-CM | POA: Diagnosis not present

## 2023-02-17 ENCOUNTER — Other Ambulatory Visit (HOSPITAL_COMMUNITY): Payer: Self-pay | Admitting: Family

## 2023-02-17 ENCOUNTER — Other Ambulatory Visit (HOSPITAL_COMMUNITY): Payer: Self-pay

## 2023-02-17 DIAGNOSIS — M461 Sacroiliitis, not elsewhere classified: Secondary | ICD-10-CM

## 2023-02-23 ENCOUNTER — Other Ambulatory Visit (HOSPITAL_COMMUNITY): Payer: Self-pay

## 2023-02-24 ENCOUNTER — Other Ambulatory Visit (HOSPITAL_COMMUNITY): Payer: Self-pay

## 2023-02-26 ENCOUNTER — Telehealth: Payer: Commercial Managed Care - PPO | Admitting: Physician Assistant

## 2023-02-26 ENCOUNTER — Other Ambulatory Visit (HOSPITAL_COMMUNITY): Payer: Self-pay

## 2023-02-26 DIAGNOSIS — J019 Acute sinusitis, unspecified: Secondary | ICD-10-CM

## 2023-02-26 DIAGNOSIS — B9689 Other specified bacterial agents as the cause of diseases classified elsewhere: Secondary | ICD-10-CM | POA: Diagnosis not present

## 2023-02-26 MED ORDER — CLINDAMYCIN HCL 300 MG PO CAPS
300.0000 mg | ORAL_CAPSULE | Freq: Three times a day (TID) | ORAL | 0 refills | Status: DC
Start: 2023-02-26 — End: 2023-05-18
  Filled 2023-02-26: qty 90, 30d supply, fill #0

## 2023-02-26 NOTE — Progress Notes (Signed)

## 2023-02-26 NOTE — Progress Notes (Signed)
I have spent 5 minutes in review of e-visit questionnaire, review and updating patient chart, medical decision making and response to patient.   William Cody Martin, PA-C    

## 2023-03-25 ENCOUNTER — Other Ambulatory Visit: Payer: Self-pay

## 2023-03-25 ENCOUNTER — Other Ambulatory Visit (HOSPITAL_COMMUNITY): Payer: Self-pay

## 2023-03-26 ENCOUNTER — Other Ambulatory Visit: Payer: Self-pay

## 2023-03-26 ENCOUNTER — Other Ambulatory Visit (HOSPITAL_COMMUNITY): Payer: Self-pay

## 2023-03-26 MED ORDER — CYANOCOBALAMIN 1000 MCG/ML IJ SOLN
1000.0000 ug | INTRAMUSCULAR | 0 refills | Status: DC
  Filled 2023-03-26: qty 4, 28d supply, fill #0

## 2023-03-31 ENCOUNTER — Other Ambulatory Visit (HOSPITAL_COMMUNITY): Payer: Self-pay

## 2023-04-01 ENCOUNTER — Other Ambulatory Visit (HOSPITAL_COMMUNITY): Payer: Self-pay

## 2023-04-22 ENCOUNTER — Other Ambulatory Visit (HOSPITAL_COMMUNITY): Payer: Self-pay

## 2023-04-24 ENCOUNTER — Other Ambulatory Visit (HOSPITAL_COMMUNITY): Payer: Self-pay

## 2023-05-01 ENCOUNTER — Other Ambulatory Visit (HOSPITAL_COMMUNITY): Payer: Self-pay

## 2023-05-01 MED ORDER — CEPHALEXIN 500 MG PO CAPS
500.0000 mg | ORAL_CAPSULE | Freq: Two times a day (BID) | ORAL | 1 refills | Status: AC
Start: 2023-05-01 — End: 2023-05-08
  Filled 2023-05-01: qty 14, 7d supply, fill #0

## 2023-05-18 ENCOUNTER — Telehealth: Payer: Commercial Managed Care - PPO | Admitting: Physician Assistant

## 2023-05-18 ENCOUNTER — Other Ambulatory Visit (HOSPITAL_COMMUNITY): Payer: Self-pay

## 2023-05-18 DIAGNOSIS — T3695XA Adverse effect of unspecified systemic antibiotic, initial encounter: Secondary | ICD-10-CM | POA: Diagnosis not present

## 2023-05-18 DIAGNOSIS — B9689 Other specified bacterial agents as the cause of diseases classified elsewhere: Secondary | ICD-10-CM | POA: Diagnosis not present

## 2023-05-18 DIAGNOSIS — J019 Acute sinusitis, unspecified: Secondary | ICD-10-CM | POA: Diagnosis not present

## 2023-05-18 DIAGNOSIS — B379 Candidiasis, unspecified: Secondary | ICD-10-CM

## 2023-05-18 MED ORDER — CLINDAMYCIN HCL 300 MG PO CAPS
300.0000 mg | ORAL_CAPSULE | Freq: Three times a day (TID) | ORAL | 0 refills | Status: AC
Start: 2023-05-18 — End: 2023-05-28
  Filled 2023-05-18: qty 30, 10d supply, fill #0

## 2023-05-18 MED ORDER — FLUCONAZOLE 150 MG PO TABS
150.0000 mg | ORAL_TABLET | ORAL | 0 refills | Status: DC | PRN
Start: 2023-05-18 — End: 2023-09-10
  Filled 2023-05-18: qty 4, 12d supply, fill #0

## 2023-05-18 NOTE — Patient Instructions (Signed)
[name], thank you for joining Margaretann Loveless, PA-C for today's virtual visit.  While this provider is not your primary care provider (PCP), if your PCP is located in our provider database this encounter information will be shared with them immediately following your visit.   A [facility] MyChart account gives you access to today's visit and all your visits, tests, and labs performed at [facility] " click here if you don't have a [facility] MyChart account or go to [email or web address]  Consent: (Patient) SHELLANE HOBBS provided verbal consent for this virtual visit at the beginning of the encounter.  Current Medications:  Current Outpatient Medications:    clindamycin (CLEOCIN) 300 MG capsule, Take 1 capsule (300 mg total) by mouth 3 (three) times daily for 10 days., Disp: 30 capsule, Rfl: 0   fluconazole (DIFLUCAN) 150 MG tablet, Take 1 tablet (150 mg total) by mouth every 3 (three) days as needed., Disp: 4 tablet, Rfl: 0   ALPRAZolam (XANAX) 0.5 MG tablet, Take 1 tablet by mouth twice daily as needed for anxiety., Disp: 60 tablet, Rfl: 3   ALPRAZolam (XANAX) 0.5 MG tablet, Take 1 tablet (0.5 mg total) by mouth 2 (two) times daily as needed., Disp: 60 tablet, Rfl: 3   ALPRAZolam (XANAX) 0.5 MG tablet, Take 1 tablet (0.5 mg total) by mouth 2 (two) times daily as needed., Disp: 60 tablet, Rfl: 2   ALPRAZolam (XANAX) 0.5 MG tablet, Take 1 tablet (0.5 mg total) by mouth 2 (two) times daily as needed., Disp: 30 tablet, Rfl: 5   ascorbic acid (VITAMIN C) 500 MG tablet, Take 500 mg by mouth daily. (Patient not taking: Reported on 01/29/2022), Disp: , Rfl:    aspirin EC 325 MG tablet, Take 1 tablet (325 mg total) by mouth 2 (two) times daily., Disp: 60 tablet, Rfl: 0   b complex vitamins capsule, Take 1 capsule by mouth daily., Disp: , Rfl:    bacitracin-polymyxin b, ophth, (POLYCIN) OINT, Apply as directed twice daily., Disp: 3.5 g, Rfl: 1   conjugated estrogens  (PREMARIN) vaginal cream, Place 1 Applicatorful vaginally once a week., Disp: , Rfl:    conjugated estrogens (PREMARIN) vaginal cream, Place vaginally 2 (two) times a week as directed., Disp: 30 g, Rfl: 3   conjugated estrogens (PREMARIN) vaginal cream, Apply as directed vaginally 2 (two) times a week., Disp: 30 g, Rfl: 3   conjugated estrogens (PREMARIN) vaginal cream, Apply as directed vaginally twice a week 30 days, Disp: 30 g, Rfl: 5   conjugated estrogens (PREMARIN) vaginal cream, Use vaginally 2 times weekly as directed, Disp: 30 g, Rfl: 5   conjugated estrogens (PREMARIN) vaginal cream, Place 0.5 g vaginally 2 (two) times a week., Disp: 30 g, Rfl: 2   cyanocobalamin (VITAMIN B12) 1000 MCG/ML injection, Inject 1 mL  into the skin Once a week., Disp: 4 mL, Rfl: 3   cyanocobalamin (VITAMIN B12) 1000 MCG/ML injection, Inject 1 mL (1,000 mcg total) into the skin once a week for 4 weeks, Disp: 4 mL, Rfl: 0   cyanocobalamin (VITAMIN B12) 1000 MCG/ML injection, Inject 1 mL (1,000 mcg total) into the skin once a week., Disp: 4 mL, Rfl: 0   esomeprazole (NEXIUM) 40 MG capsule, Take 40 mg by mouth daily as needed for heartburn., Disp: , Rfl:    esomeprazole (NEXIUM) 40 MG capsule, Take 1 capule by mouth once a day., Disp: 90 capsule, Rfl: 2   esomeprazole (NEXIUM) 40 MG capsule, Take 1 capsule (40  mg total) by mouth daily., Disp: 90 capsule, Rfl: 2   esomeprazole (NEXIUM) 40 MG capsule, Take 1 capsule (40 mg total) by mouth daily., Disp: 90 capsule, Rfl: 1   Evolocumab (REPATHA SURECLICK) 140 MG/ML SOAJ, Inject 1ml (140 mg) into the skin every 14 (fourteen) days., Disp: 6 mL, Rfl: 3   fluticasone (FLONASE) 50 MCG/ACT nasal spray, Place 1 spray into both nostrils 2 (two) times daily., Disp: 16 g, Rfl: 3   fluticasone (FLONASE) 50 MCG/ACT nasal spray, Place 1 spray into both nostrils 2 (two) times daily., Disp: 16 g, Rfl: 3   furosemide (LASIX) 20 MG tablet, Take 20 mg by mouth daily as needed for edema.,  Disp: , Rfl:    furosemide (LASIX) 20 MG tablet, Take 1 tablet (20 mg total) by mouth daily as needed., Disp: 90 tablet, Rfl: 1   furosemide (LASIX) 20 MG tablet, Take 1 tablet (20 mg total) by mouth daily as needed., Disp: 90 tablet, Rfl: 1   Insulin Pen Needle (TECHLITE PEN NEEDLES) 32G X 4 MM MISC, Use as directed with Saxenda daily, Disp: 100 each, Rfl: 0   levocetirizine (XYZAL) 5 MG tablet, Take 5 mg by mouth at bedtime. OCC TAKES BID, Disp: , Rfl:    levothyroxine (SYNTHROID) 125 MCG tablet, Take 1 tablet (125 mcg total) by mouth daily before breakfast. NEED NAME BRAND, Disp: 90 tablet, Rfl: 1   losartan-hydrochlorothiazide (HYZAAR) 100-25 MG tablet, Take 1 tablet by mouth daily., Disp: 90 tablet, Rfl: 1   losartan-hydrochlorothiazide (HYZAAR) 50-12.5 MG per tablet, TAKE 1 TABLET BY MOUTH DAILY. (Patient taking differently: Take 1 tablet by mouth daily.), Disp: 90 tablet, Rfl: 0   losartan-hydrochlorothiazide (HYZAAR) 50-12.5 MG tablet, Take 1 tablet by mouth daily., Disp: 90 tablet, Rfl: 1   losartan-hydrochlorothiazide (HYZAAR) 50-12.5 MG tablet, Take 1 tablet by mouth daily., Disp: 90 tablet, Rfl: 1   losartan-hydrochlorothiazide (HYZAAR) 50-12.5 MG tablet, Take 1 tablet by mouth daily., Disp: 90 tablet, Rfl: 1   losartan-hydrochlorothiazide (HYZAAR) 50-12.5 MG tablet, Take 1 tablet by mouth daily., Disp: 90 tablet, Rfl: 1   Magnesium 400 MG TABS, Take 400 mg by mouth at bedtime., Disp: , Rfl:    metoprolol tartrate (LOPRESSOR) 50 MG tablet, Take 1 tablet (50 mg total) by mouth two hours prior to CT test as directed., Disp: 1 tablet, Rfl: 0   mupirocin ointment (BACTROBAN) 2 %, Apply liberally to affected area twice a day, Disp: 22 g, Rfl: 2   nitroGLYCERIN (NITROSTAT) 0.4 MG SL tablet, Place 1 tablet (0.4 mg total) under the tongue every 5 (five) minutes as needed for chest pain. (Patient not taking: Reported on 01/29/2022), Disp: 30 tablet, Rfl: 6   Pitavastatin Calcium (LIVALO) 4 MG TABS,  Take 1 tablet by mouth once a day., Disp: 90 tablet, Rfl: 2   Pitavastatin Calcium (LIVALO) 4 MG TABS, Take 1 tablet (4 mg total) by mouth daily., Disp: 90 tablet, Rfl: 2   Pitavastatin Calcium (LIVALO) 4 MG TABS, Take 1 tablet (4 mg total) by mouth daily., Disp: 90 tablet, Rfl: 1   Pitavastatin Calcium (LIVALO) 4 MG TABS, Take 1 tablet (4 mg total) by mouth daily., Disp: 90 tablet, Rfl: 1   Pitavastatin Calcium (LIVALO) 4 MG TABS, Take 1 tablet (4 mg total) by mouth daily., Disp: 90 tablet, Rfl: 1   sennosides-docusate sodium (SENOKOT-S) 8.6-50 MG tablet, Take 2 tablets by mouth daily. (Patient taking differently: Take 2 tablets by mouth daily as needed for constipation.), Disp: 30  tablet, Rfl: 1   SYNTHROID 112 MCG tablet, Take 1 tablet (112 mcg total) by mouth in the morning on an empty stomach, Disp: 90 tablet, Rfl: 1   SYNTHROID 112 MCG tablet, Take 1 tablet (112 mcg total) by mouth daily on an empty stomach, Disp: 90 tablet, Rfl: 1   SYNTHROID 112 MCG tablet, Take 1 tablet (112 mcg total) by mouth in the morning on an empty stomach., Disp: 90 tablet, Rfl: 1   SYNTHROID 112 MCG tablet, Take 1 tablet (112 mcg total) by mouth in the morning on an empty stomach, Disp: 90 tablet, Rfl: 1   tirzepatide (MOUNJARO) 5 MG/0.5ML Pen, See admin instructions., Disp: , Rfl:    valACYclovir (VALTREX) 1000 MG tablet, Take 1 tablet (1,000 mg total) by mouth daily as needed., Disp: 30 tablet, Rfl: 6   valACYclovir (VALTREX) 500 MG tablet, Take 1 tablet by mouth once daily., Disp: 90 tablet, Rfl: 2   valACYclovir (VALTREX) 500 MG tablet, Take 1 tablet (500 mg total) by mouth daily., Disp: 90 tablet, Rfl: 2   valACYclovir (VALTREX) 500 MG tablet, Take 1 tablet (500 mg total) by mouth daily., Disp: 90 tablet, Rfl: 1   valACYclovir (VALTREX) 500 MG tablet, Take 1 tablet (500 mg total) by mouth daily., Disp: 90 tablet, Rfl: 1   zolpidem (AMBIEN) 10 MG tablet, Take 10 mg by mouth at bedtime., Disp: , Rfl:    zolpidem  (AMBIEN) 10 MG tablet, Take 1 tablet by mouth as needed at bedtime., Disp: 30 tablet, Rfl: 5   zolpidem (AMBIEN) 10 MG tablet, Take 1 tablet (10 ml) by mouth at bedtime as needed., Disp: 30 tablet, Rfl: 5   zolpidem (AMBIEN) 10 MG tablet, Take 1 tablet (10 mg total) by mouth at bedtime as needed., Disp: 30 tablet, Rfl: 2   zolpidem (AMBIEN) 10 MG tablet, Take 1 tablet (10 mg total) by mouth at bedtime as needed., Disp: 30 tablet, Rfl: 5   Medications ordered in this encounter:  Meds ordered this encounter  Medications   clindamycin (CLEOCIN) 300 MG capsule    Sig: Take 1 capsule (300 mg total) by mouth 3 (three) times daily for 10 days.    Dispense:  30 capsule    Refill:  0    Order Specific Question:   Supervising Provider    Answer:   Merrilee Jansky [[id number]]   fluconazole (DIFLUCAN) 150 MG tablet    Sig: Take 1 tablet (150 mg total) by mouth every 3 (three) days as needed.    Dispense:  4 tablet    Refill:  0    Order Specific Question:   Supervising Provider    Answer:   Merrilee Jansky [id number]     *If you need refills on other medications prior to your next appointment, please contact your pharmacy*  Follow-Up: Call back or seek an in-person evaluation if the symptoms worsen or if the condition fails to improve as anticipated.  [facility] [phone]  Other Instructions Sinus Infection, Adult A sinus infection, also called sinusitis, is inflammation of your sinuses. Sinuses are hollow spaces in the bones around your face. Your sinuses are located: Around your eyes. In the middle of your forehead. Behind your nose. In your cheekbones. Mucus normally drains out of your sinuses. When your nasal tissues become inflamed or swollen, mucus can become trapped or blocked. This allows bacteria, viruses, and fungi to grow, which leads to infection. Most infections of the sinuses are  caused by a virus. A sinus infection can develop quickly. It can last for up to  4 weeks (acute) or for more than 12 weeks (chronic). A sinus infection often develops after a cold. What are the causes? This condition is caused by anything that creates swelling in the sinuses or stops mucus from draining. This includes: Allergies. Asthma. Infection from bacteria or viruses. Deformities or blockages in your nose or sinuses. Abnormal growths in the nose (nasal polyps). Pollutants, such as chemicals or irritants in the air. Infection from fungi. This is rare. What increases the risk? You are more likely to develop this condition if you: Have a weak body defense system (immune system). Do a lot of swimming or diving. Overuse nasal sprays. Smoke. What are the signs or symptoms? The main symptoms of this condition are pain and a feeling of pressure around the affected sinuses. Other symptoms include: Stuffy nose or congestion that makes it difficult to breathe through your nose. Thick yellow or greenish drainage from your nose. Tenderness, swelling, and warmth over the affected sinuses. A cough that may get worse at night. Decreased sense of smell and taste. Extra mucus that collects in the throat or the back of the nose (postnasal drip) causing a sore throat or bad breath. Tiredness (fatigue). Fever. How is this diagnosed? This condition is diagnosed based on: Your symptoms. Your medical history. A physical exam. Tests to find out if your condition is acute or chronic. This may include: Checking your nose for nasal polyps. Viewing your sinuses using a device that has a light (endoscope). Testing for allergies or bacteria. Imaging tests, such as an MRI or CT scan. In rare cases, a bone biopsy may be done to rule out more serious types of fungal sinus disease. How is this treated? Treatment for a sinus infection depends on the cause and whether your condition is chronic or acute. If caused by a virus, your symptoms should go away on their own within 10 days. You  may be given medicines to relieve symptoms. They include: Medicines that shrink swollen nasal passages (decongestants). A spray that eases inflammation of the nostrils (topical intranasal corticosteroids). Rinses that help get rid of thick mucus in your nose (nasal saline washes). Medicines that treat allergies (antihistamines). Over-the-counter pain relievers. If caused by bacteria, your health care provider may recommend waiting to see if your symptoms improve. Most bacterial infections will get better without antibiotic medicine. You may be given antibiotics if you have: A severe infection. A weak immune system. If caused by narrow nasal passages or nasal polyps, surgery may be needed. Follow these instructions at home: Medicines Take, use, or apply over-the-counter and prescription medicines only as told by your health care provider. These may include nasal sprays. If you were prescribed an antibiotic medicine, take it as told by your health care provider. Do not stop taking the antibiotic even if you start to feel better. Hydrate and humidify  Drink enough fluid to keep your urine pale yellow. Staying hydrated will help to thin your mucus. Use a cool mist humidifier to keep the humidity level in your home above 50%. Inhale steam for 10-15 minutes, 3-4 times a day, or as told by your health care provider. You can do this in the bathroom while a hot shower is running. Limit your exposure to cool or dry air. Rest Rest as much as possible. Sleep with your head raised (elevated). Make sure you get enough sleep each night. General instructions  Apply  a warm, moist washcloth to your face 3-4 times a day or as told by your health care provider. This will help with discomfort. Use nasal saline washes as often as told by your health care provider. Wash your hands often with soap and water to reduce your exposure to germs. If soap and water are not available, use hand sanitizer. Do not smoke.  Avoid being around people who are smoking (secondhand smoke). Keep all follow-up visits. This is important. Contact a health care provider if: You have a fever. Your symptoms get worse. Your symptoms do not improve within 10 days. Get help right away if: You have a severe headache. You have persistent vomiting. You have severe pain or swelling around your face or eyes. You have vision problems. You develop confusion. Your neck is stiff. You have trouble breathing. These symptoms may be an emergency. Get help right away. Call 911. Do not wait to see if the symptoms will go away. Do not drive yourself to the hospital. Summary A sinus infection is soreness and inflammation of your sinuses. Sinuses are hollow spaces in the bones around your face. This condition is caused by nasal tissues that become inflamed or swollen. The swelling traps or blocks the flow of mucus. This allows bacteria, viruses, and fungi to grow, which leads to infection. If you were prescribed an antibiotic medicine, take it as told by your health care provider. Do not stop taking the antibiotic even if you start to feel better. Keep all follow-up visits. This is important. This information is not intended to replace advice given to you by your health care provider. Make sure you discuss any questions you have with your health care provider. Document Revised: 05/07/2021 Document Reviewed: 05/07/2021 Elsevier Patient Education  2024 [employer]    If you have been instructed to have an in-person evaluation today at a local Urgent Care facility, please use the link below. It will take you to a list of all of our available [facility], including address, phone number and hours of operation. Please do not delay care.  [facility]  If you or a family member do not have a primary care provider, use the link below to schedule a visit and establish care. When you choose a [facility] primary care  physician or advanced practice provider, you gain a long-term partner in health. Find a Primary Care Provider  Learn more about [facility]'s in-office and virtual care options: [facility] - Get Care Now

## 2023-05-18 NOTE — Progress Notes (Signed)
Virtual Visit Consent   KEYETTA DEVANE, you are scheduled for a virtual visit with a [facility] provider today. Just as with appointments in the office, your consent must be obtained to participate. Your consent will be active for this visit and any virtual visit you may have with one of our providers in the next 365 days. If you have a MyChart account, a copy of this consent can be sent to you electronically.  As this is a virtual visit, video technology does not allow for your provider to perform a traditional examination. This may limit your provider's ability to fully assess your condition. If your provider identifies any concerns that need to be evaluated in person or the need to arrange testing (such as labs, EKG, etc.), we will make arrangements to do so. Although advances in technology are sophisticated, we cannot ensure that it will always work on either your end or our end. If the connection with a video visit is poor, the visit may have to be switched to a telephone visit. With either a video or telephone visit, we are not always able to ensure that we have a secure connection.  By engaging in this virtual visit, you consent to the provision of healthcare and authorize for your insurance to be billed (if applicable) for the services provided during this visit. Depending on your insurance coverage, you may receive a charge related to this service.  I need to obtain your verbal consent now. Are you willing to proceed with your visit today? CARIANNA LEVELL has provided verbal consent on 05/18/2023 for a virtual visit (video or telephone). Margaretann Loveless, PA-C  Date: 05/18/2023 1:53 PM  Virtual Visit via Video Note   I, Margaretann Loveless, connected with  KARITA BUMGARDNER  ([id number], 1956-06-06) on 05/18/23 at  1:45 PM EST by a video-enabled telemedicine application and verified that I am speaking with the correct person using two identifiers.  Location: Patient: Virtual Visit  Location Patient: Home Provider: Virtual Visit Location Provider: Home Office   I discussed the limitations of evaluation and management by telemedicine and the availability of in person appointments. The patient expressed understanding and agreed to proceed.    History of Present Illness: KINDALL KORNHAUSER is a 67 y.o. who identifies as a female who was assigned female at birth, and is being seen today for possible sinus infection.  HPI: URI  This is a recurrent problem. The current episode started in the past 7 days. The problem has been gradually worsening. Maximum temperature: subjective fevers. Associated symptoms include congestion, coughing, headaches, rhinorrhea and sinus pain (right retro-orbital is localized). Pertinent negatives include no diarrhea, ear pain, nausea, neck pain, plugged ear sensation, sore throat, vomiting or wheezing. She has tried antihistamine and decongestant for the symptoms. The treatment provided no relief.  H/O: MRSA  BP: 146/86 O2: 100% HR: 72  Problems:  Patient Active Problem List   Diagnosis Date Noted   Primary localized osteoarthritis of left hip 10/26/2018   S/P hip replacement, right 10/26/2018   History of shingles 01/14/2013   HTN (hypertension) 11/09/2011   Hypothyroidism 11/09/2011   Insomnia 11/09/2011   AR (allergic rhinitis) 11/09/2011   Cervical spondylosis 11/09/2011   Chronic hip pain 11/09/2011   MRSA infection 11/09/2011    Allergies:  Allergies  Allergen Reactions   Penicillins Anaphylaxis    Did it involve swelling of the face/tongue/throat, SOB, or low BP? Yes Did it involve sudden or severe rash/hives, skin  peeling, or any reaction on the inside of your mouth or nose? No Did you need to seek medical attention at a hospital or doctor's office? Yes When did it last happen?    childhood   If all above answers are "NO", may proceed with cephalosporin use.    Medications:  Current Outpatient Medications:    clindamycin  (CLEOCIN) 300 MG capsule, Take 1 capsule (300 mg total) by mouth 3 (three) times daily for 10 days., Disp: 30 capsule, Rfl: 0   fluconazole (DIFLUCAN) 150 MG tablet, Take 1 tablet (150 mg total) by mouth every 3 (three) days as needed., Disp: 4 tablet, Rfl: 0   ALPRAZolam (XANAX) 0.5 MG tablet, Take 1 tablet by mouth twice daily as needed for anxiety., Disp: 60 tablet, Rfl: 3   ALPRAZolam (XANAX) 0.5 MG tablet, Take 1 tablet (0.5 mg total) by mouth 2 (two) times daily as needed., Disp: 60 tablet, Rfl: 3   ALPRAZolam (XANAX) 0.5 MG tablet, Take 1 tablet (0.5 mg total) by mouth 2 (two) times daily as needed., Disp: 60 tablet, Rfl: 2   ALPRAZolam (XANAX) 0.5 MG tablet, Take 1 tablet (0.5 mg total) by mouth 2 (two) times daily as needed., Disp: 30 tablet, Rfl: 5   ascorbic acid (VITAMIN C) 500 MG tablet, Take 500 mg by mouth daily. (Patient not taking: Reported on 01/29/2022), Disp: , Rfl:    aspirin EC 325 MG tablet, Take 1 tablet (325 mg total) by mouth 2 (two) times daily., Disp: 60 tablet, Rfl: 0   b complex vitamins capsule, Take 1 capsule by mouth daily., Disp: , Rfl:    bacitracin-polymyxin b, ophth, (POLYCIN) OINT, Apply as directed twice daily., Disp: 3.5 g, Rfl: 1   conjugated estrogens (PREMARIN) vaginal cream, Place 1 Applicatorful vaginally once a week., Disp: , Rfl:    conjugated estrogens (PREMARIN) vaginal cream, Place vaginally 2 (two) times a week as directed., Disp: 30 g, Rfl: 3   conjugated estrogens (PREMARIN) vaginal cream, Apply as directed vaginally 2 (two) times a week., Disp: 30 g, Rfl: 3   conjugated estrogens (PREMARIN) vaginal cream, Apply as directed vaginally twice a week 30 days, Disp: 30 g, Rfl: 5   conjugated estrogens (PREMARIN) vaginal cream, Use vaginally 2 times weekly as directed, Disp: 30 g, Rfl: 5   conjugated estrogens (PREMARIN) vaginal cream, Place 0.5 g vaginally 2 (two) times a week., Disp: 30 g, Rfl: 2   cyanocobalamin (VITAMIN B12) 1000 MCG/ML injection,  Inject 1 mL  into the skin Once a week., Disp: 4 mL, Rfl: 3   cyanocobalamin (VITAMIN B12) 1000 MCG/ML injection, Inject 1 mL (1,000 mcg total) into the skin once a week for 4 weeks, Disp: 4 mL, Rfl: 0   cyanocobalamin (VITAMIN B12) 1000 MCG/ML injection, Inject 1 mL (1,000 mcg total) into the skin once a week., Disp: 4 mL, Rfl: 0   esomeprazole (NEXIUM) 40 MG capsule, Take 40 mg by mouth daily as needed for heartburn., Disp: , Rfl:    esomeprazole (NEXIUM) 40 MG capsule, Take 1 capule by mouth once a day., Disp: 90 capsule, Rfl: 2   esomeprazole (NEXIUM) 40 MG capsule, Take 1 capsule (40 mg total) by mouth daily., Disp: 90 capsule, Rfl: 2   esomeprazole (NEXIUM) 40 MG capsule, Take 1 capsule (40 mg total) by mouth daily., Disp: 90 capsule, Rfl: 1   Evolocumab (REPATHA SURECLICK) 140 MG/ML SOAJ, Inject 1ml (140 mg) into the skin every 14 (fourteen) days., Disp: 6 mL, Rfl: 3  fluticasone (FLONASE) 50 MCG/ACT nasal spray, Place 1 spray into both nostrils 2 (two) times daily., Disp: 16 g, Rfl: 3   fluticasone (FLONASE) 50 MCG/ACT nasal spray, Place 1 spray into both nostrils 2 (two) times daily., Disp: 16 g, Rfl: 3   furosemide (LASIX) 20 MG tablet, Take 20 mg by mouth daily as needed for edema., Disp: , Rfl:    furosemide (LASIX) 20 MG tablet, Take 1 tablet (20 mg total) by mouth daily as needed., Disp: 90 tablet, Rfl: 1   furosemide (LASIX) 20 MG tablet, Take 1 tablet (20 mg total) by mouth daily as needed., Disp: 90 tablet, Rfl: 1   Insulin Pen Needle (TECHLITE PEN NEEDLES) 32G X 4 MM MISC, Use as directed with Saxenda daily, Disp: 100 each, Rfl: 0   levocetirizine (XYZAL) 5 MG tablet, Take 5 mg by mouth at bedtime. OCC TAKES BID, Disp: , Rfl:    levothyroxine (SYNTHROID) 125 MCG tablet, Take 1 tablet (125 mcg total) by mouth daily before breakfast. NEED NAME BRAND, Disp: 90 tablet, Rfl: 1   losartan-hydrochlorothiazide (HYZAAR) 100-25 MG tablet, Take 1 tablet by mouth daily., Disp: 90 tablet, Rfl:  1   losartan-hydrochlorothiazide (HYZAAR) 50-12.5 MG per tablet, TAKE 1 TABLET BY MOUTH DAILY. (Patient taking differently: Take 1 tablet by mouth daily.), Disp: 90 tablet, Rfl: 0   losartan-hydrochlorothiazide (HYZAAR) 50-12.5 MG tablet, Take 1 tablet by mouth daily., Disp: 90 tablet, Rfl: 1   losartan-hydrochlorothiazide (HYZAAR) 50-12.5 MG tablet, Take 1 tablet by mouth daily., Disp: 90 tablet, Rfl: 1   losartan-hydrochlorothiazide (HYZAAR) 50-12.5 MG tablet, Take 1 tablet by mouth daily., Disp: 90 tablet, Rfl: 1   losartan-hydrochlorothiazide (HYZAAR) 50-12.5 MG tablet, Take 1 tablet by mouth daily., Disp: 90 tablet, Rfl: 1   Magnesium 400 MG TABS, Take 400 mg by mouth at bedtime., Disp: , Rfl:    metoprolol tartrate (LOPRESSOR) 50 MG tablet, Take 1 tablet (50 mg total) by mouth two hours prior to CT test as directed., Disp: 1 tablet, Rfl: 0   mupirocin ointment (BACTROBAN) 2 %, Apply liberally to affected area twice a day, Disp: 22 g, Rfl: 2   nitroGLYCERIN (NITROSTAT) 0.4 MG SL tablet, Place 1 tablet (0.4 mg total) under the tongue every 5 (five) minutes as needed for chest pain. (Patient not taking: Reported on 01/29/2022), Disp: 30 tablet, Rfl: 6   Pitavastatin Calcium (LIVALO) 4 MG TABS, Take 1 tablet by mouth once a day., Disp: 90 tablet, Rfl: 2   Pitavastatin Calcium (LIVALO) 4 MG TABS, Take 1 tablet (4 mg total) by mouth daily., Disp: 90 tablet, Rfl: 2   Pitavastatin Calcium (LIVALO) 4 MG TABS, Take 1 tablet (4 mg total) by mouth daily., Disp: 90 tablet, Rfl: 1   Pitavastatin Calcium (LIVALO) 4 MG TABS, Take 1 tablet (4 mg total) by mouth daily., Disp: 90 tablet, Rfl: 1   Pitavastatin Calcium (LIVALO) 4 MG TABS, Take 1 tablet (4 mg total) by mouth daily., Disp: 90 tablet, Rfl: 1   sennosides-docusate sodium (SENOKOT-S) 8.6-50 MG tablet, Take 2 tablets by mouth daily. (Patient taking differently: Take 2 tablets by mouth daily as needed for constipation.), Disp: 30 tablet, Rfl: 1   SYNTHROID  112 MCG tablet, Take 1 tablet (112 mcg total) by mouth in the morning on an empty stomach, Disp: 90 tablet, Rfl: 1   SYNTHROID 112 MCG tablet, Take 1 tablet (112 mcg total) by mouth daily on an empty stomach, Disp: 90 tablet, Rfl: 1   SYNTHROID 112  MCG tablet, Take 1 tablet (112 mcg total) by mouth in the morning on an empty stomach., Disp: 90 tablet, Rfl: 1   SYNTHROID 112 MCG tablet, Take 1 tablet (112 mcg total) by mouth in the morning on an empty stomach, Disp: 90 tablet, Rfl: 1   tirzepatide (MOUNJARO) 5 MG/0.5ML Pen, See admin instructions., Disp: , Rfl:    valACYclovir (VALTREX) 1000 MG tablet, Take 1 tablet (1,000 mg total) by mouth daily as needed., Disp: 30 tablet, Rfl: 6   valACYclovir (VALTREX) 500 MG tablet, Take 1 tablet by mouth once daily., Disp: 90 tablet, Rfl: 2   valACYclovir (VALTREX) 500 MG tablet, Take 1 tablet (500 mg total) by mouth daily., Disp: 90 tablet, Rfl: 2   valACYclovir (VALTREX) 500 MG tablet, Take 1 tablet (500 mg total) by mouth daily., Disp: 90 tablet, Rfl: 1   valACYclovir (VALTREX) 500 MG tablet, Take 1 tablet (500 mg total) by mouth daily., Disp: 90 tablet, Rfl: 1   zolpidem (AMBIEN) 10 MG tablet, Take 10 mg by mouth at bedtime., Disp: , Rfl:    zolpidem (AMBIEN) 10 MG tablet, Take 1 tablet by mouth as needed at bedtime., Disp: 30 tablet, Rfl: 5   zolpidem (AMBIEN) 10 MG tablet, Take 1 tablet (10 ml) by mouth at bedtime as needed., Disp: 30 tablet, Rfl: 5   zolpidem (AMBIEN) 10 MG tablet, Take 1 tablet (10 mg total) by mouth at bedtime as needed., Disp: 30 tablet, Rfl: 2   zolpidem (AMBIEN) 10 MG tablet, Take 1 tablet (10 mg total) by mouth at bedtime as needed., Disp: 30 tablet, Rfl: 5  Observations/Objective: Patient is well-developed, well-nourished in no acute distress.  Resting comfortably at home.  Head is normocephalic, atraumatic.  No labored breathing.  Speech is clear and coherent with logical content.  Patient is alert and oriented at baseline.     Assessment and Plan: 1. Acute bacterial sinusitis - clindamycin (CLEOCIN) 300 MG capsule; Take 1 capsule (300 mg total) by mouth 3 (three) times daily for 10 days.  Dispense: 30 capsule; Refill: 0  2. Antibiotic-induced yeast infection - fluconazole (DIFLUCAN) 150 MG tablet; Take 1 tablet (150 mg total) by mouth every 3 (three) days as needed.  Dispense: 4 tablet; Refill: 0  - Worsening symptoms that have not responded to OTC medications.  - Will give Clindamycin - Continue allergy medications.  - Steam and humidifier can help - Stay well hydrated and get plenty of rest.  - Diflucan given as prophylaxis as patient tends to get vaginal yeast infections with antibiotic use - Seek in person evaluation if no symptom improvement or if symptoms worsen   Follow Up Instructions: I discussed the assessment and treatment plan with the patient. The patient was provided an opportunity to ask questions and all were answered. The patient agreed with the plan and demonstrated an understanding of the instructions.  A copy of instructions were sent to the patient via MyChart unless otherwise noted below.    The patient was advised to call back or seek an in-person evaluation if the symptoms worsen or if the condition fails to improve as anticipated.    Margaretann Loveless, PA-C

## 2023-05-25 ENCOUNTER — Other Ambulatory Visit (HOSPITAL_COMMUNITY): Payer: Self-pay

## 2023-05-25 ENCOUNTER — Other Ambulatory Visit: Payer: Self-pay

## 2023-05-25 MED ORDER — CYANOCOBALAMIN 1000 MCG/ML IJ SOLN
1000.0000 ug | INTRAMUSCULAR | 0 refills | Status: DC
  Filled 2023-05-25: qty 4, 28d supply, fill #0

## 2023-05-26 ENCOUNTER — Other Ambulatory Visit: Payer: Self-pay

## 2023-06-03 ENCOUNTER — Other Ambulatory Visit (HOSPITAL_COMMUNITY): Payer: Self-pay

## 2023-06-03 ENCOUNTER — Telehealth: Payer: Commercial Managed Care - PPO

## 2023-06-03 DIAGNOSIS — J329 Chronic sinusitis, unspecified: Secondary | ICD-10-CM | POA: Diagnosis not present

## 2023-06-03 MED ORDER — PREDNISONE 10 MG (21) PO TBPK
ORAL_TABLET | ORAL | 0 refills | Status: AC
Start: 2023-06-03 — End: 2023-06-09
  Filled 2023-06-03: qty 21, 6d supply, fill #0

## 2023-06-03 MED ORDER — CLINDAMYCIN HCL 300 MG PO CAPS
300.0000 mg | ORAL_CAPSULE | Freq: Three times a day (TID) | ORAL | 0 refills | Status: AC
Start: 2023-06-03 — End: 2023-06-13
  Filled 2023-06-03: qty 30, 10d supply, fill #0

## 2023-06-03 NOTE — Patient Instructions (Signed)
Amber Werner, thank you for joining Amber Climes, PA-C for today's virtual visit.  While this provider is not your primary care provider (PCP), if your PCP is located in our provider database this encounter information will be shared with them immediately following your visit.   A [facility] MyChart account gives you access to today's visit and all your visits, tests, and labs performed at [facility] " click here if you don't have a [facility] MyChart account or go to [email or web address]  Consent: (Patient) Amber Werner provided verbal consent for this virtual visit at the beginning of the encounter.  Current Medications:  Current Outpatient Medications:    ALPRAZolam (XANAX) 0.5 MG tablet, Take 1 tablet by mouth twice daily as needed for anxiety., Disp: 60 tablet, Rfl: 3   ALPRAZolam (XANAX) 0.5 MG tablet, Take 1 tablet (0.5 mg total) by mouth 2 (two) times daily as needed., Disp: 60 tablet, Rfl: 3   ALPRAZolam (XANAX) 0.5 MG tablet, Take 1 tablet (0.5 mg total) by mouth 2 (two) times daily as needed., Disp: 60 tablet, Rfl: 2   ALPRAZolam (XANAX) 0.5 MG tablet, Take 1 tablet (0.5 mg total) by mouth 2 (two) times daily as needed. (30 day supply), Disp: 30 tablet, Rfl: 5   ascorbic acid (VITAMIN C) 500 MG tablet, Take 500 mg by mouth daily. (Patient not taking: Reported on 01/29/2022), Disp: , Rfl:    aspirin EC 325 MG tablet, Take 1 tablet (325 mg total) by mouth 2 (two) times daily., Disp: 60 tablet, Rfl: 0   b complex vitamins capsule, Take 1 capsule by mouth daily., Disp: , Rfl:    bacitracin-polymyxin b, ophth, (POLYCIN) OINT, Apply as directed twice daily., Disp: 3.5 g, Rfl: 1   conjugated estrogens (PREMARIN) vaginal cream, Place 1 Applicatorful vaginally once a week., Disp: , Rfl:    conjugated estrogens (PREMARIN) vaginal cream, Place vaginally 2 (two) times a week as directed., Disp: 30 g, Rfl: 3   conjugated estrogens (PREMARIN) vaginal cream, Apply  as directed vaginally 2 (two) times a week., Disp: 30 g, Rfl: 3   conjugated estrogens (PREMARIN) vaginal cream, Apply as directed vaginally twice a week 30 days, Disp: 30 g, Rfl: 5   conjugated estrogens (PREMARIN) vaginal cream, Use vaginally 2 times weekly as directed, Disp: 30 g, Rfl: 5   conjugated estrogens (PREMARIN) vaginal cream, Place 0.5 g vaginally 2 (two) times a week., Disp: 30 g, Rfl: 2   cyanocobalamin (VITAMIN B12) 1000 MCG/ML injection, Inject 1 mL  into the skin Once a week., Disp: 4 mL, Rfl: 3   cyanocobalamin (VITAMIN B12) 1000 MCG/ML injection, Inject 1 mL (1,000 mcg total) into the skin once a week for 4 weeks, Disp: 4 mL, Rfl: 0   cyanocobalamin (VITAMIN B12) 1000 MCG/ML injection, Inject 1 mL (1,000 mcg total) into the skin once a week., Disp: 4 mL, Rfl: 0   esomeprazole (NEXIUM) 40 MG capsule, Take 40 mg by mouth daily as needed for heartburn., Disp: , Rfl:    esomeprazole (NEXIUM) 40 MG capsule, Take 1 capule by mouth once a day., Disp: 90 capsule, Rfl: 2   esomeprazole (NEXIUM) 40 MG capsule, Take 1 capsule (40 mg total) by mouth daily., Disp: 90 capsule, Rfl: 2   esomeprazole (NEXIUM) 40 MG capsule, Take 1 capsule (40 mg total) by mouth daily., Disp: 90 capsule, Rfl: 1   Evolocumab (REPATHA SURECLICK) 140 MG/ML SOAJ, Inject 1ml (140 mg) into the skin every 14 (fourteen) days.,  Disp: 6 mL, Rfl: 3   fluconazole (DIFLUCAN) 150 MG tablet, Take 1 tablet (150 mg total) by mouth every 3 (three) days as needed., Disp: 4 tablet, Rfl: 0   fluticasone (FLONASE) 50 MCG/ACT nasal spray, Place 1 spray into both nostrils 2 (two) times daily., Disp: 16 g, Rfl: 3   fluticasone (FLONASE) 50 MCG/ACT nasal spray, Place 1 spray into both nostrils 2 (two) times daily., Disp: 16 g, Rfl: 3   furosemide (LASIX) 20 MG tablet, Take 20 mg by mouth daily as needed for edema., Disp: , Rfl:    furosemide (LASIX) 20 MG tablet, Take 1 tablet (20 mg total) by mouth daily as needed., Disp: 90 tablet, Rfl:  1   furosemide (LASIX) 20 MG tablet, Take 1 tablet (20 mg total) by mouth daily as needed., Disp: 90 tablet, Rfl: 1   Insulin Pen Needle (TECHLITE PEN NEEDLES) 32G X 4 MM MISC, Use as directed with Saxenda daily, Disp: 100 each, Rfl: 0   levocetirizine (XYZAL) 5 MG tablet, Take 5 mg by mouth at bedtime. OCC TAKES BID, Disp: , Rfl:    levothyroxine (SYNTHROID) 125 MCG tablet, Take 1 tablet (125 mcg total) by mouth daily before breakfast. NEED NAME BRAND, Disp: 90 tablet, Rfl: 1   losartan-hydrochlorothiazide (HYZAAR) 100-25 MG tablet, Take 1 tablet by mouth daily., Disp: 90 tablet, Rfl: 1   losartan-hydrochlorothiazide (HYZAAR) 50-12.5 MG per tablet, TAKE 1 TABLET BY MOUTH DAILY. (Patient taking differently: Take 1 tablet by mouth daily.), Disp: 90 tablet, Rfl: 0   losartan-hydrochlorothiazide (HYZAAR) 50-12.5 MG tablet, Take 1 tablet by mouth daily., Disp: 90 tablet, Rfl: 1   losartan-hydrochlorothiazide (HYZAAR) 50-12.5 MG tablet, Take 1 tablet by mouth daily., Disp: 90 tablet, Rfl: 1   losartan-hydrochlorothiazide (HYZAAR) 50-12.5 MG tablet, Take 1 tablet by mouth daily., Disp: 90 tablet, Rfl: 1   losartan-hydrochlorothiazide (HYZAAR) 50-12.5 MG tablet, Take 1 tablet by mouth daily., Disp: 90 tablet, Rfl: 1   Magnesium 400 MG TABS, Take 400 mg by mouth at bedtime., Disp: , Rfl:    metoprolol tartrate (LOPRESSOR) 50 MG tablet, Take 1 tablet (50 mg total) by mouth two hours prior to CT test as directed., Disp: 1 tablet, Rfl: 0   mupirocin ointment (BACTROBAN) 2 %, Apply liberally to affected area twice a day, Disp: 22 g, Rfl: 2   nitroGLYCERIN (NITROSTAT) 0.4 MG SL tablet, Place 1 tablet (0.4 mg total) under the tongue every 5 (five) minutes as needed for chest pain. (Patient not taking: Reported on 01/29/2022), Disp: 30 tablet, Rfl: 6   Pitavastatin Calcium (LIVALO) 4 MG TABS, Take 1 tablet by mouth once a day., Disp: 90 tablet, Rfl: 2   Pitavastatin Calcium (LIVALO) 4 MG TABS, Take 1 tablet (4 mg  total) by mouth daily., Disp: 90 tablet, Rfl: 2   Pitavastatin Calcium (LIVALO) 4 MG TABS, Take 1 tablet (4 mg total) by mouth daily., Disp: 90 tablet, Rfl: 1   Pitavastatin Calcium (LIVALO) 4 MG TABS, Take 1 tablet (4 mg total) by mouth daily., Disp: 90 tablet, Rfl: 1   Pitavastatin Calcium (LIVALO) 4 MG TABS, Take 1 tablet (4 mg total) by mouth daily., Disp: 90 tablet, Rfl: 1   sennosides-docusate sodium (SENOKOT-S) 8.6-50 MG tablet, Take 2 tablets by mouth daily. (Patient taking differently: Take 2 tablets by mouth daily as needed for constipation.), Disp: 30 tablet, Rfl: 1   SYNTHROID 112 MCG tablet, Take 1 tablet (112 mcg total) by mouth in the morning on an empty stomach,  Disp: 90 tablet, Rfl: 1   SYNTHROID 112 MCG tablet, Take 1 tablet (112 mcg total) by mouth daily on an empty stomach, Disp: 90 tablet, Rfl: 1   SYNTHROID 112 MCG tablet, Take 1 tablet (112 mcg total) by mouth in the morning on an empty stomach., Disp: 90 tablet, Rfl: 1   SYNTHROID 112 MCG tablet, Take 1 tablet (112 mcg total) by mouth in the morning on an empty stomach, Disp: 90 tablet, Rfl: 1   tirzepatide (MOUNJARO) 5 MG/0.5ML Pen, See admin instructions., Disp: , Rfl:    valACYclovir (VALTREX) 1000 MG tablet, Take 1 tablet (1,000 mg total) by mouth daily as needed., Disp: 30 tablet, Rfl: 6   valACYclovir (VALTREX) 500 MG tablet, Take 1 tablet by mouth once daily., Disp: 90 tablet, Rfl: 2   valACYclovir (VALTREX) 500 MG tablet, Take 1 tablet (500 mg total) by mouth daily., Disp: 90 tablet, Rfl: 2   valACYclovir (VALTREX) 500 MG tablet, Take 1 tablet (500 mg total) by mouth daily., Disp: 90 tablet, Rfl: 1   valACYclovir (VALTREX) 500 MG tablet, Take 1 tablet (500 mg total) by mouth daily., Disp: 90 tablet, Rfl: 1   zolpidem (AMBIEN) 10 MG tablet, Take 10 mg by mouth at bedtime., Disp: , Rfl:    zolpidem (AMBIEN) 10 MG tablet, Take 1 tablet by mouth as needed at bedtime., Disp: 30 tablet, Rfl: 5   zolpidem (AMBIEN) 10 MG  tablet, Take 1 tablet (10 ml) by mouth at bedtime as needed., Disp: 30 tablet, Rfl: 5   zolpidem (AMBIEN) 10 MG tablet, Take 1 tablet (10 mg total) by mouth at bedtime as needed., Disp: 30 tablet, Rfl: 2   zolpidem (AMBIEN) 10 MG tablet, Take 1 tablet (10 mg total) by mouth at bedtime as needed., Disp: 30 tablet, Rfl: 5   Medications ordered in this encounter:  No orders of the defined types were placed in this encounter.    *If you need refills on other medications prior to your next appointment, please contact your pharmacy*  Follow-Up: Call back or seek an in-person evaluation if the symptoms worsen or if the condition fails to improve as anticipated.  [facility] [phone]  Other Instructions Please take antibiotic as directed.  Increase fluid intake.  Use Saline nasal spray.  Take a daily multivitamin. Continue your allergy medication regimen and OTC medication. Use the prednisone pack as directed.  Place a humidifier in the bedroom.  Please call or return clinic if symptoms are not improving.  Sinusitis Sinusitis is redness, soreness, and swelling (inflammation) of the paranasal sinuses. Paranasal sinuses are air pockets within the bones of your face (beneath the eyes, the middle of the forehead, or above the eyes). In healthy paranasal sinuses, mucus is able to drain out, and air is able to circulate through them by way of your nose. However, when your paranasal sinuses are inflamed, mucus and air can become trapped. This can allow bacteria and other germs to grow and cause infection. Sinusitis can develop quickly and last only a short time (acute) or continue over a long period (chronic). Sinusitis that lasts for more than 12 weeks is considered chronic.  CAUSES  Causes of sinusitis include: Allergies. Structural abnormalities, such as displacement of the cartilage that separates your nostrils (deviated septum), which can decrease the air flow through your nose and  sinuses and affect sinus drainage. Functional abnormalities, such as when the small hairs (cilia) that line your sinuses and help remove mucus  do not work properly or are not present. SYMPTOMS  Symptoms of acute and chronic sinusitis are the same. The primary symptoms are pain and pressure around the affected sinuses. Other symptoms include: Upper toothache. Earache. Headache. Bad breath. Decreased sense of smell and taste. A cough, which worsens when you are lying flat. Fatigue. Fever. Thick drainage from your nose, which often is green and may contain pus (purulent). Swelling and warmth over the affected sinuses. DIAGNOSIS  Your caregiver will perform a physical exam. During the exam, your caregiver may: Look in your nose for signs of abnormal growths in your nostrils (nasal polyps). Tap over the affected sinus to check for signs of infection. View the inside of your sinuses (endoscopy) with a special imaging device with a light attached (endoscope), which is inserted into your sinuses. If your caregiver suspects that you have chronic sinusitis, one or more of the following tests may be recommended: Allergy tests. Nasal culture A sample of mucus is taken from your nose and sent to a lab and screened for bacteria. Nasal cytology A sample of mucus is taken from your nose and examined by your caregiver to determine if your sinusitis is related to an allergy. TREATMENT  Most cases of acute sinusitis are related to a viral infection and will resolve on their own within 10 days. Sometimes medicines are prescribed to help relieve symptoms (pain medicine, decongestants, nasal steroid sprays, or saline sprays).  However, for sinusitis related to a bacterial infection, your caregiver will prescribe antibiotic medicines. These are medicines that will help kill the bacteria causing the infection.  Rarely, sinusitis is caused by a fungal infection. In theses cases, your caregiver will prescribe  antifungal medicine. For some cases of chronic sinusitis, surgery is needed. Generally, these are cases in which sinusitis recurs more than 3 times per year, despite other treatments. HOME CARE INSTRUCTIONS  Drink plenty of water. Water helps thin the mucus so your sinuses can drain more easily. Use a humidifier. Inhale steam 3 to 4 times a day (for example, sit in the bathroom with the shower running). Apply a warm, moist washcloth to your face 3 to 4 times a day, or as directed by your caregiver. Use saline nasal sprays to help moisten and clean your sinuses. Take over-the-counter or prescription medicines for pain, discomfort, or fever only as directed by your caregiver. SEEK IMMEDIATE MEDICAL CARE IF: You have increasing pain or severe headaches. You have nausea, vomiting, or drowsiness. You have swelling around your face. You have vision problems. You have a stiff neck. You have difficulty breathing. MAKE SURE YOU:  Understand these instructions. Will watch your condition. Will get help right away if you are not doing well or get worse. Document Released: 06/02/2005 Document Revised: 08/25/2011 Document Reviewed: 06/17/2011 [facility] Patient Information 2014 [place], [place].    If you have been instructed to have an in-person evaluation today at a local Urgent Care facility, please use the link below. It will take you to a list of all of our available [facility], including address, phone number and hours of operation. Please do not delay care.  [facility]  If you or a family member do not have a primary care provider, use the link below to schedule a visit and establish care. When you choose a [facility] primary care physician or advanced practice provider, you gain a long-term partner in health. Find a Primary Care Provider  Learn more about [facility]'s in-office and  virtual care options: [place] [facility] - Get Care Now

## 2023-06-03 NOTE — Progress Notes (Signed)
Virtual Visit Consent   Amber Werner, you are scheduled for a virtual visit with a [place] provider today. Just as with appointments in the office, your consent must be obtained to participate. Your consent will be active for this visit and any virtual visit you may have with one of our providers in the next 365 days. If you have a MyChart account, a copy of this consent can be sent to you electronically.  As this is a virtual visit, video technology does not allow for your provider to perform a traditional examination. This may limit your provider's ability to fully assess your condition. If your provider identifies any concerns that need to be evaluated in person or the need to arrange testing (such as labs, EKG, etc.), we will make arrangements to do so. Although advances in technology are sophisticated, we cannot ensure that it will always work on either your end or our end. If the connection with a video visit is poor, the visit may have to be switched to a telephone visit. With either a video or telephone visit, we are not always able to ensure that we have a secure connection.  By engaging in this virtual visit, you consent to the provision of healthcare and authorize for your insurance to be billed (if applicable) for the services provided during this visit. Depending on your insurance coverage, you may receive a charge related to this service.  I need to obtain your verbal consent now. Are you willing to proceed with your visit today? Amber Werner has provided verbal consent on 06/03/2023 for a virtual visit (video or telephone). Amber Werner, [place]  Date: 06/03/2023 8:53 AM  Virtual Visit via Video Note   I, Amber Werner, connected with  Amber Werner  ([id number], 01-05-1956) on 06/03/23 at  8:45 AM EST by a video-enabled telemedicine application and verified that I am speaking with the correct person using two identifiers.  Location: Patient: Virtual Visit  Location Patient: Home Provider: Virtual Visit Location Provider: Home Office   I discussed the limitations of evaluation and management by telemedicine and the availability of in person appointments. The patient expressed understanding and agreed to proceed.    History of Present Illness: Amber Werner is a 67 y.o. who identifies as a female who was assigned female at birth, and is being seen today for URI symptoms starting substantial sinus pressure and sinus pain, worse in R frontal and maxillary sinuses over the past few weeks. Discharge is still dark green and thick.Irrigation and Flonase at home. History of recurrent sinusitis. Typically requires clindamycin as doxycycline has become subtherapeutic and she has penicillin allergy.  HPI: HPI  Problems:  Patient Active Problem List   Diagnosis Date Noted   Primary localized osteoarthritis of left hip 10/26/2018   S/P hip replacement, right 10/26/2018   History of shingles 01/14/2013   HTN (hypertension) 11/09/2011   Hypothyroidism 11/09/2011   Insomnia 11/09/2011   AR (allergic rhinitis) 11/09/2011   Cervical spondylosis 11/09/2011   Chronic hip pain 11/09/2011   MRSA infection 11/09/2011    Allergies:  Allergies  Allergen Reactions   Penicillins Anaphylaxis    Did it involve swelling of the face/tongue/throat, SOB, or low BP? Yes Did it involve sudden or severe rash/hives, skin peeling, or any reaction on the inside of your mouth or nose? No Did you need to seek medical attention at a hospital or doctor's office? Yes When did it last happen?  childhood   If all above answers are "NO", may proceed with cephalosporin use.    Paxlovid [Nirmatrelvir-Ritonavir] Swelling    Swelling and blistering in her throat   Medications:  Current Outpatient Medications:    clindamycin (CLEOCIN) 300 MG capsule, Take 1 capsule (300 mg total) by mouth 3 (three) times daily for 10 days., Disp: 30 capsule, Rfl: 0   predniSONE (STERAPRED  UNI-PAK 21 TAB) 10 MG (21) TBPK tablet, Take following package directions, Disp: 21 tablet, Rfl: 0   ALPRAZolam (XANAX) 0.5 MG tablet, Take 1 tablet by mouth twice daily as needed for anxiety., Disp: 60 tablet, Rfl: 3   ALPRAZolam (XANAX) 0.5 MG tablet, Take 1 tablet (0.5 mg total) by mouth 2 (two) times daily as needed., Disp: 60 tablet, Rfl: 3   ALPRAZolam (XANAX) 0.5 MG tablet, Take 1 tablet (0.5 mg total) by mouth 2 (two) times daily as needed., Disp: 60 tablet, Rfl: 2   ALPRAZolam (XANAX) 0.5 MG tablet, Take 1 tablet (0.5 mg total) by mouth 2 (two) times daily as needed. (30 day supply), Disp: 30 tablet, Rfl: 5   ascorbic acid (VITAMIN C) 500 MG tablet, Take 500 mg by mouth daily. (Patient not taking: Reported on 01/29/2022), Disp: , Rfl:    aspirin EC 325 MG tablet, Take 1 tablet (325 mg total) by mouth 2 (two) times daily., Disp: 60 tablet, Rfl: 0   b complex vitamins capsule, Take 1 capsule by mouth daily., Disp: , Rfl:    bacitracin-polymyxin b, ophth, (POLYCIN) OINT, Apply as directed twice daily., Disp: 3.5 g, Rfl: 1   conjugated estrogens (PREMARIN) vaginal cream, Place 1 Applicatorful vaginally once a week., Disp: , Rfl:    conjugated estrogens (PREMARIN) vaginal cream, Place vaginally 2 (two) times a week as directed., Disp: 30 g, Rfl: 3   conjugated estrogens (PREMARIN) vaginal cream, Apply as directed vaginally 2 (two) times a week., Disp: 30 g, Rfl: 3   conjugated estrogens (PREMARIN) vaginal cream, Apply as directed vaginally twice a week 30 days, Disp: 30 g, Rfl: 5   conjugated estrogens (PREMARIN) vaginal cream, Use vaginally 2 times weekly as directed, Disp: 30 g, Rfl: 5   conjugated estrogens (PREMARIN) vaginal cream, Place 0.5 g vaginally 2 (two) times a week., Disp: 30 g, Rfl: 2   cyanocobalamin (VITAMIN B12) 1000 MCG/ML injection, Inject 1 mL  into the skin Once a week., Disp: 4 mL, Rfl: 3   cyanocobalamin (VITAMIN B12) 1000 MCG/ML injection, Inject 1 mL (1,000 mcg total) into  the skin once a week for 4 weeks, Disp: 4 mL, Rfl: 0   cyanocobalamin (VITAMIN B12) 1000 MCG/ML injection, Inject 1 mL (1,000 mcg total) into the skin once a week., Disp: 4 mL, Rfl: 0   esomeprazole (NEXIUM) 40 MG capsule, Take 40 mg by mouth daily as needed for heartburn., Disp: , Rfl:    esomeprazole (NEXIUM) 40 MG capsule, Take 1 capule by mouth once a day., Disp: 90 capsule, Rfl: 2   esomeprazole (NEXIUM) 40 MG capsule, Take 1 capsule (40 mg total) by mouth daily., Disp: 90 capsule, Rfl: 2   esomeprazole (NEXIUM) 40 MG capsule, Take 1 capsule (40 mg total) by mouth daily., Disp: 90 capsule, Rfl: 1   Evolocumab (REPATHA SURECLICK) 140 MG/ML SOAJ, Inject 1ml (140 mg) into the skin every 14 (fourteen) days., Disp: 6 mL, Rfl: 3   fluconazole (DIFLUCAN) 150 MG tablet, Take 1 tablet (150 mg total) by mouth every 3 (three) days as needed., Disp: 4  tablet, Rfl: 0   fluticasone (FLONASE) 50 MCG/ACT nasal spray, Place 1 spray into both nostrils 2 (two) times daily., Disp: 16 g, Rfl: 3   fluticasone (FLONASE) 50 MCG/ACT nasal spray, Place 1 spray into both nostrils 2 (two) times daily., Disp: 16 g, Rfl: 3   furosemide (LASIX) 20 MG tablet, Take 20 mg by mouth daily as needed for edema., Disp: , Rfl:    furosemide (LASIX) 20 MG tablet, Take 1 tablet (20 mg total) by mouth daily as needed., Disp: 90 tablet, Rfl: 1   furosemide (LASIX) 20 MG tablet, Take 1 tablet (20 mg total) by mouth daily as needed., Disp: 90 tablet, Rfl: 1   Insulin Pen Needle (TECHLITE PEN NEEDLES) 32G X 4 MM MISC, Use as directed with Saxenda daily, Disp: 100 each, Rfl: 0   levocetirizine (XYZAL) 5 MG tablet, Take 5 mg by mouth at bedtime. OCC TAKES BID, Disp: , Rfl:    levothyroxine (SYNTHROID) 125 MCG tablet, Take 1 tablet (125 mcg total) by mouth daily before breakfast. NEED NAME BRAND, Disp: 90 tablet, Rfl: 1   losartan-hydrochlorothiazide (HYZAAR) 100-25 MG tablet, Take 1 tablet by mouth daily., Disp: 90 tablet, Rfl: 1    losartan-hydrochlorothiazide (HYZAAR) 50-12.5 MG per tablet, TAKE 1 TABLET BY MOUTH DAILY. (Patient taking differently: Take 1 tablet by mouth daily.), Disp: 90 tablet, Rfl: 0   losartan-hydrochlorothiazide (HYZAAR) 50-12.5 MG tablet, Take 1 tablet by mouth daily., Disp: 90 tablet, Rfl: 1   losartan-hydrochlorothiazide (HYZAAR) 50-12.5 MG tablet, Take 1 tablet by mouth daily., Disp: 90 tablet, Rfl: 1   losartan-hydrochlorothiazide (HYZAAR) 50-12.5 MG tablet, Take 1 tablet by mouth daily., Disp: 90 tablet, Rfl: 1   losartan-hydrochlorothiazide (HYZAAR) 50-12.5 MG tablet, Take 1 tablet by mouth daily., Disp: 90 tablet, Rfl: 1   Magnesium 400 MG TABS, Take 400 mg by mouth at bedtime., Disp: , Rfl:    metoprolol tartrate (LOPRESSOR) 50 MG tablet, Take 1 tablet (50 mg total) by mouth two hours prior to CT test as directed., Disp: 1 tablet, Rfl: 0   mupirocin ointment (BACTROBAN) 2 %, Apply liberally to affected area twice a day, Disp: 22 g, Rfl: 2   nitroGLYCERIN (NITROSTAT) 0.4 MG SL tablet, Place 1 tablet (0.4 mg total) under the tongue every 5 (five) minutes as needed for chest pain. (Patient not taking: Reported on 01/29/2022), Disp: 30 tablet, Rfl: 6   Pitavastatin Calcium (LIVALO) 4 MG TABS, Take 1 tablet by mouth once a day., Disp: 90 tablet, Rfl: 2   Pitavastatin Calcium (LIVALO) 4 MG TABS, Take 1 tablet (4 mg total) by mouth daily., Disp: 90 tablet, Rfl: 2   Pitavastatin Calcium (LIVALO) 4 MG TABS, Take 1 tablet (4 mg total) by mouth daily., Disp: 90 tablet, Rfl: 1   Pitavastatin Calcium (LIVALO) 4 MG TABS, Take 1 tablet (4 mg total) by mouth daily., Disp: 90 tablet, Rfl: 1   Pitavastatin Calcium (LIVALO) 4 MG TABS, Take 1 tablet (4 mg total) by mouth daily., Disp: 90 tablet, Rfl: 1   sennosides-docusate sodium (SENOKOT-S) 8.6-50 MG tablet, Take 2 tablets by mouth daily. (Patient taking differently: Take 2 tablets by mouth daily as needed for constipation.), Disp: 30 tablet, Rfl: 1   SYNTHROID 112  MCG tablet, Take 1 tablet (112 mcg total) by mouth in the morning on an empty stomach, Disp: 90 tablet, Rfl: 1   SYNTHROID 112 MCG tablet, Take 1 tablet (112 mcg total) by mouth daily on an empty stomach, Disp: 90 tablet, Rfl:  1   SYNTHROID 112 MCG tablet, Take 1 tablet (112 mcg total) by mouth in the morning on an empty stomach., Disp: 90 tablet, Rfl: 1   SYNTHROID 112 MCG tablet, Take 1 tablet (112 mcg total) by mouth in the morning on an empty stomach, Disp: 90 tablet, Rfl: 1   valACYclovir (VALTREX) 1000 MG tablet, Take 1 tablet (1,000 mg total) by mouth daily as needed., Disp: 30 tablet, Rfl: 6   valACYclovir (VALTREX) 500 MG tablet, Take 1 tablet by mouth once daily., Disp: 90 tablet, Rfl: 2   valACYclovir (VALTREX) 500 MG tablet, Take 1 tablet (500 mg total) by mouth daily., Disp: 90 tablet, Rfl: 2   valACYclovir (VALTREX) 500 MG tablet, Take 1 tablet (500 mg total) by mouth daily., Disp: 90 tablet, Rfl: 1   valACYclovir (VALTREX) 500 MG tablet, Take 1 tablet (500 mg total) by mouth daily., Disp: 90 tablet, Rfl: 1   zolpidem (AMBIEN) 10 MG tablet, Take 10 mg by mouth at bedtime., Disp: , Rfl:    zolpidem (AMBIEN) 10 MG tablet, Take 1 tablet by mouth as needed at bedtime., Disp: 30 tablet, Rfl: 5   zolpidem (AMBIEN) 10 MG tablet, Take 1 tablet (10 ml) by mouth at bedtime as needed., Disp: 30 tablet, Rfl: 5   zolpidem (AMBIEN) 10 MG tablet, Take 1 tablet (10 mg total) by mouth at bedtime as needed., Disp: 30 tablet, Rfl: 5  Observations/Objective: Patient is well-developed, well-nourished in no acute distress.  Resting comfortably  at home.  Head is normocephalic, atraumatic.  No labored breathing.  Speech is clear and coherent with logical content.  Patient is alert and oriented at baseline.   Assessment and Plan: 1. Recurrent sinus infections (Primary) - clindamycin (CLEOCIN) 300 MG capsule; Take 1 capsule (300 mg total) by mouth 3 (three) times daily for 10 days.  Dispense: 30  capsule; Refill: 0 - predniSONE (STERAPRED UNI-PAK 21 TAB) 10 MG (21) TBPK tablet; Take following package directions  Dispense: 21 tablet; Refill: 0  Rx Clindamycin per orders.  Increase fluids.  Rest.  Saline nasal spray.  Probiotic.  Mucinex as directed.  Humidifier in bedroom. Will give prednisone pack for inflammation. continue allergy medication regimen.  Call or return to clinic if symptoms are not improving.   Follow Up Instructions: I discussed the assessment and treatment plan with the patient. The patient was provided an opportunity to ask questions and all were answered. The patient agreed with the plan and demonstrated an understanding of the instructions.  A copy of instructions were sent to the patient via MyChart unless otherwise noted below.   The patient was advised to call back or seek an in-person evaluation if the symptoms worsen or if the condition fails to improve as anticipated.    Amber Climes, PA-C

## 2023-06-15 ENCOUNTER — Other Ambulatory Visit (HOSPITAL_COMMUNITY): Payer: Self-pay

## 2023-06-25 ENCOUNTER — Other Ambulatory Visit (HOSPITAL_COMMUNITY): Payer: Self-pay

## 2023-06-25 ENCOUNTER — Other Ambulatory Visit: Payer: Self-pay

## 2023-06-25 ENCOUNTER — Other Ambulatory Visit (HOSPITAL_BASED_OUTPATIENT_CLINIC_OR_DEPARTMENT_OTHER): Payer: Self-pay

## 2023-06-26 ENCOUNTER — Other Ambulatory Visit (HOSPITAL_COMMUNITY): Payer: Self-pay

## 2023-07-06 ENCOUNTER — Telehealth: Payer: Commercial Managed Care - PPO | Admitting: Family Medicine

## 2023-07-06 DIAGNOSIS — J329 Chronic sinusitis, unspecified: Secondary | ICD-10-CM

## 2023-07-06 NOTE — Progress Notes (Signed)
  Because you were treated twice in Dec for sinus infection you will need to be seen in person for this, as your condition warrants further evaluation and I recommend that you be seen in a face-to-face visit at your PCP office or urgent care.   NOTE: There will be NO CHARGE for this E-Visit   If you are having a true medical emergency, please call 911.

## 2023-07-08 DIAGNOSIS — J069 Acute upper respiratory infection, unspecified: Secondary | ICD-10-CM | POA: Diagnosis not present

## 2023-07-28 ENCOUNTER — Other Ambulatory Visit: Payer: Self-pay

## 2023-07-29 ENCOUNTER — Encounter (HOSPITAL_COMMUNITY): Payer: Self-pay

## 2023-07-29 ENCOUNTER — Other Ambulatory Visit (HOSPITAL_COMMUNITY): Payer: Self-pay

## 2023-07-29 DIAGNOSIS — M25552 Pain in left hip: Secondary | ICD-10-CM | POA: Diagnosis not present

## 2023-07-29 DIAGNOSIS — E782 Mixed hyperlipidemia: Secondary | ICD-10-CM | POA: Diagnosis not present

## 2023-07-29 DIAGNOSIS — R7303 Prediabetes: Secondary | ICD-10-CM | POA: Diagnosis not present

## 2023-07-29 DIAGNOSIS — I10 Essential (primary) hypertension: Secondary | ICD-10-CM | POA: Diagnosis not present

## 2023-07-29 DIAGNOSIS — Z724 Inappropriate diet and eating habits: Secondary | ICD-10-CM | POA: Diagnosis not present

## 2023-07-29 DIAGNOSIS — F411 Generalized anxiety disorder: Secondary | ICD-10-CM | POA: Diagnosis not present

## 2023-07-29 DIAGNOSIS — Z6821 Body mass index (BMI) 21.0-21.9, adult: Secondary | ICD-10-CM | POA: Diagnosis not present

## 2023-07-29 DIAGNOSIS — E039 Hypothyroidism, unspecified: Secondary | ICD-10-CM | POA: Diagnosis not present

## 2023-07-29 MED ORDER — MOUNJARO 10 MG/0.5ML ~~LOC~~ SOAJ
10.0000 mg | SUBCUTANEOUS | 0 refills | Status: AC
Start: 2023-07-29 — End: ?
  Filled 2023-07-29 – 2023-08-12 (×4): qty 2, 28d supply, fill #0

## 2023-08-01 ENCOUNTER — Other Ambulatory Visit (HOSPITAL_COMMUNITY): Payer: Self-pay

## 2023-08-02 ENCOUNTER — Other Ambulatory Visit (HOSPITAL_COMMUNITY): Payer: Self-pay

## 2023-08-03 ENCOUNTER — Other Ambulatory Visit: Payer: Self-pay

## 2023-08-03 ENCOUNTER — Other Ambulatory Visit (HOSPITAL_COMMUNITY): Payer: Self-pay

## 2023-08-04 ENCOUNTER — Other Ambulatory Visit (HOSPITAL_COMMUNITY): Payer: Self-pay

## 2023-08-05 ENCOUNTER — Other Ambulatory Visit (HOSPITAL_BASED_OUTPATIENT_CLINIC_OR_DEPARTMENT_OTHER): Payer: Self-pay

## 2023-08-05 ENCOUNTER — Other Ambulatory Visit (HOSPITAL_COMMUNITY): Payer: Self-pay

## 2023-08-05 MED ORDER — SYNTHROID 112 MCG PO TABS
112.0000 ug | ORAL_TABLET | Freq: Every day | ORAL | 1 refills | Status: DC
  Filled 2023-08-18 – 2023-09-09 (×2): qty 90, 90d supply, fill #0
  Filled 2023-12-09: qty 90, 90d supply, fill #1

## 2023-08-07 ENCOUNTER — Other Ambulatory Visit: Payer: Self-pay

## 2023-08-07 ENCOUNTER — Encounter: Payer: Self-pay | Admitting: Pharmacist

## 2023-08-07 ENCOUNTER — Other Ambulatory Visit (HOSPITAL_COMMUNITY): Payer: Self-pay

## 2023-08-07 MED ORDER — CYANOCOBALAMIN 1000 MCG/ML IJ SOLN
1000.0000 ug | INTRAMUSCULAR | 0 refills | Status: DC
  Filled 2023-08-07: qty 4, 28d supply, fill #0

## 2023-08-09 ENCOUNTER — Telehealth: Payer: Commercial Managed Care - PPO | Admitting: Family

## 2023-08-09 DIAGNOSIS — J019 Acute sinusitis, unspecified: Secondary | ICD-10-CM

## 2023-08-09 MED ORDER — DOXYCYCLINE HYCLATE 100 MG PO TABS: 100.0000 mg | ORAL_TABLET | Freq: Two times a day (BID) | ORAL | 0 refills | Status: DC

## 2023-08-09 MED ORDER — LEVOFLOXACIN 500 MG PO TABS: 500.0000 mg | ORAL_TABLET | Freq: Every day | ORAL | 0 refills | Status: AC

## 2023-08-09 NOTE — Addendum Note (Signed)
 Addended by: Jannifer Rodney A on: 08/09/2023 06:27 PM   Modules accepted: Orders

## 2023-08-09 NOTE — Progress Notes (Signed)
E-Visit for Sinus Problems  We are sorry that you are not feeling well.  Here is how we plan to help!  Based on what you have shared with me it looks like you have sinusitis.  Sinusitis is inflammation and infection in the sinus cavities of the head.  Based on your presentation I believe you most likely have Acute Bacterial Sinusitis.  This is an infection caused by bacteria and is treated with antibiotics. I have prescribed Doxycycline 100mg by mouth twice a day for 10 days. You may use an oral decongestant such as Mucinex D or if you have glaucoma or high blood pressure use plain Mucinex. Saline nasal spray help and can safely be used as often as needed for congestion.  If you develop worsening sinus pain, fever or notice severe headache and vision changes, or if symptoms are not better after completion of antibiotic, please schedule an appointment with a health care provider.    Sinus infections are not as easily transmitted as other respiratory infection, however we still recommend that you avoid close contact with loved ones, especially the very young and elderly.  Remember to wash your hands thoroughly throughout the day as this is the number one way to prevent the spread of infection!  Home Care: Only take medications as instructed by your medical team. Complete the entire course of an antibiotic. Do not take these medications with alcohol. A steam or ultrasonic humidifier can help congestion.  You can place a towel over your head and breathe in the steam from hot water coming from a faucet. Avoid close contacts especially the very young and the elderly. Cover your mouth when you cough or sneeze. Always remember to wash your hands.  Get Help Right Away If: You develop worsening fever or sinus pain. You develop a severe head ache or visual changes. Your symptoms persist after you have completed your treatment plan.  Make sure you Understand these instructions. Will watch your  condition. Will get help right away if you are not doing well or get worse.  Thank you for choosing an e-visit.  Your e-visit answers were reviewed by a board certified advanced clinical practitioner to complete your personal care plan. Depending upon the condition, your plan could have included both over the counter or prescription medications.  Please review your pharmacy choice. Make sure the pharmacy is open so you can pick up prescription now. If there is a problem, you may contact your provider through MyChart messaging and have the prescription routed to another pharmacy.  Your safety is important to us. If you have drug allergies check your prescription carefully.   For the next 24 hours you can use MyChart to ask questions about today's visit, request a non-urgent call back, or ask for a work or school excuse. You will get an email in the next two days asking about your experience. I hope that your e-visit has been valuable and will speed your recovery.   Approximately 5 minutes was spent documenting and reviewing patient's chart.   

## 2023-08-12 ENCOUNTER — Other Ambulatory Visit: Payer: Self-pay

## 2023-08-12 ENCOUNTER — Other Ambulatory Visit (HOSPITAL_COMMUNITY): Payer: Self-pay

## 2023-08-13 ENCOUNTER — Other Ambulatory Visit: Payer: Self-pay

## 2023-08-14 ENCOUNTER — Other Ambulatory Visit (HOSPITAL_COMMUNITY): Payer: Self-pay

## 2023-08-14 MED ORDER — ZOLPIDEM TARTRATE 10 MG PO TABS
10.0000 mg | ORAL_TABLET | Freq: Every evening | ORAL | 3 refills | Status: AC | PRN
  Filled 2023-09-09: qty 30, 30d supply, fill #0
  Filled 2023-10-09 (×2): qty 30, 30d supply, fill #1
  Filled 2023-10-12: qty 30, 30d supply, fill #0
  Filled 2023-11-13: qty 30, 30d supply, fill #1

## 2023-08-14 MED ORDER — ALPRAZOLAM 0.5 MG PO TABS
0.5000 mg | ORAL_TABLET | Freq: Two times a day (BID) | ORAL | 3 refills | Status: AC | PRN
  Filled 2023-08-18: qty 30, 15d supply, fill #0
  Filled 2023-08-24 – 2023-09-26 (×3): qty 30, 30d supply, fill #0
  Filled 2023-10-27: qty 30, 30d supply, fill #1
  Filled ????-??-??: fill #0

## 2023-08-18 ENCOUNTER — Other Ambulatory Visit (HOSPITAL_COMMUNITY): Payer: Self-pay

## 2023-08-18 ENCOUNTER — Other Ambulatory Visit: Payer: Self-pay

## 2023-08-24 ENCOUNTER — Other Ambulatory Visit (HOSPITAL_COMMUNITY): Payer: Self-pay

## 2023-08-24 ENCOUNTER — Other Ambulatory Visit: Payer: Self-pay

## 2023-08-27 DIAGNOSIS — E039 Hypothyroidism, unspecified: Secondary | ICD-10-CM | POA: Diagnosis not present

## 2023-08-27 DIAGNOSIS — E538 Deficiency of other specified B group vitamins: Secondary | ICD-10-CM | POA: Diagnosis not present

## 2023-08-27 DIAGNOSIS — I10 Essential (primary) hypertension: Secondary | ICD-10-CM | POA: Diagnosis not present

## 2023-08-27 DIAGNOSIS — L659 Nonscarring hair loss, unspecified: Secondary | ICD-10-CM | POA: Diagnosis not present

## 2023-08-27 DIAGNOSIS — Z1331 Encounter for screening for depression: Secondary | ICD-10-CM | POA: Diagnosis not present

## 2023-08-27 DIAGNOSIS — Z6820 Body mass index (BMI) 20.0-20.9, adult: Secondary | ICD-10-CM | POA: Diagnosis not present

## 2023-08-27 DIAGNOSIS — R632 Polyphagia: Secondary | ICD-10-CM | POA: Diagnosis not present

## 2023-08-27 DIAGNOSIS — G4709 Other insomnia: Secondary | ICD-10-CM | POA: Diagnosis not present

## 2023-08-27 DIAGNOSIS — E7800 Pure hypercholesterolemia, unspecified: Secondary | ICD-10-CM | POA: Diagnosis not present

## 2023-09-09 ENCOUNTER — Other Ambulatory Visit: Payer: Self-pay

## 2023-09-09 ENCOUNTER — Other Ambulatory Visit (HOSPITAL_COMMUNITY): Payer: Self-pay

## 2023-09-10 ENCOUNTER — Other Ambulatory Visit (HOSPITAL_COMMUNITY): Payer: Self-pay

## 2023-09-10 ENCOUNTER — Other Ambulatory Visit: Payer: Self-pay

## 2023-09-10 MED ORDER — FLUCONAZOLE 150 MG PO TABS
150.0000 mg | ORAL_TABLET | ORAL | 0 refills | Status: DC
  Filled 2023-09-10 – 2023-09-16 (×2): qty 4, 12d supply, fill #0

## 2023-09-16 ENCOUNTER — Other Ambulatory Visit: Payer: Self-pay

## 2023-09-16 ENCOUNTER — Other Ambulatory Visit (HOSPITAL_COMMUNITY): Payer: Self-pay

## 2023-09-16 MED ORDER — LOSARTAN POTASSIUM-HCTZ 50-12.5 MG PO TABS
1.0000 | ORAL_TABLET | Freq: Every day | ORAL | 0 refills | Status: DC
  Filled 2023-09-16: qty 90, 90d supply, fill #0

## 2023-09-17 DIAGNOSIS — E7800 Pure hypercholesterolemia, unspecified: Secondary | ICD-10-CM | POA: Diagnosis not present

## 2023-09-17 DIAGNOSIS — Z6820 Body mass index (BMI) 20.0-20.9, adult: Secondary | ICD-10-CM | POA: Diagnosis not present

## 2023-09-17 DIAGNOSIS — I10 Essential (primary) hypertension: Secondary | ICD-10-CM | POA: Diagnosis not present

## 2023-09-17 DIAGNOSIS — E039 Hypothyroidism, unspecified: Secondary | ICD-10-CM | POA: Diagnosis not present

## 2023-09-17 DIAGNOSIS — Z8639 Personal history of other endocrine, nutritional and metabolic disease: Secondary | ICD-10-CM | POA: Diagnosis not present

## 2023-09-19 ENCOUNTER — Other Ambulatory Visit (HOSPITAL_COMMUNITY): Payer: Self-pay

## 2023-09-26 ENCOUNTER — Other Ambulatory Visit (HOSPITAL_COMMUNITY): Payer: Self-pay

## 2023-09-28 ENCOUNTER — Other Ambulatory Visit: Payer: Self-pay

## 2023-09-29 ENCOUNTER — Other Ambulatory Visit (HOSPITAL_COMMUNITY): Payer: Self-pay

## 2023-10-09 ENCOUNTER — Other Ambulatory Visit (HOSPITAL_COMMUNITY): Payer: Self-pay

## 2023-10-12 ENCOUNTER — Other Ambulatory Visit (HOSPITAL_COMMUNITY): Payer: Self-pay

## 2023-10-15 ENCOUNTER — Other Ambulatory Visit: Payer: Self-pay

## 2023-10-15 ENCOUNTER — Other Ambulatory Visit (HOSPITAL_COMMUNITY): Payer: Self-pay

## 2023-10-15 MED ORDER — ESOMEPRAZOLE MAGNESIUM 40 MG PO CPDR
40.0000 mg | DELAYED_RELEASE_CAPSULE | Freq: Every day | ORAL | 1 refills | Status: AC
  Filled 2023-10-15 – 2023-10-27 (×2): qty 90, 90d supply, fill #0
  Filled 2024-04-02: qty 90, 90d supply, fill #1
  Filled 2024-04-04: qty 90, 90d supply, fill #0

## 2023-10-21 ENCOUNTER — Other Ambulatory Visit (HOSPITAL_COMMUNITY): Payer: Self-pay

## 2023-10-27 ENCOUNTER — Other Ambulatory Visit: Payer: Self-pay

## 2023-10-27 ENCOUNTER — Other Ambulatory Visit (HOSPITAL_COMMUNITY): Payer: Self-pay

## 2023-10-27 MED ORDER — FUROSEMIDE 20 MG PO TABS
20.0000 mg | ORAL_TABLET | Freq: Every day | ORAL | 0 refills | Status: DC | PRN
  Filled 2023-10-27: qty 90, 90d supply, fill #0

## 2023-10-29 ENCOUNTER — Other Ambulatory Visit (HOSPITAL_COMMUNITY): Payer: Self-pay

## 2023-10-29 DIAGNOSIS — N898 Other specified noninflammatory disorders of vagina: Secondary | ICD-10-CM | POA: Diagnosis not present

## 2023-10-29 DIAGNOSIS — Z6820 Body mass index (BMI) 20.0-20.9, adult: Secondary | ICD-10-CM | POA: Diagnosis not present

## 2023-10-29 DIAGNOSIS — I10 Essential (primary) hypertension: Secondary | ICD-10-CM | POA: Diagnosis not present

## 2023-10-29 DIAGNOSIS — J329 Chronic sinusitis, unspecified: Secondary | ICD-10-CM | POA: Diagnosis not present

## 2023-10-29 DIAGNOSIS — E538 Deficiency of other specified B group vitamins: Secondary | ICD-10-CM | POA: Diagnosis not present

## 2023-10-29 DIAGNOSIS — E782 Mixed hyperlipidemia: Secondary | ICD-10-CM | POA: Diagnosis not present

## 2023-10-29 DIAGNOSIS — B009 Herpesviral infection, unspecified: Secondary | ICD-10-CM | POA: Diagnosis not present

## 2023-10-29 DIAGNOSIS — K219 Gastro-esophageal reflux disease without esophagitis: Secondary | ICD-10-CM | POA: Diagnosis not present

## 2023-10-29 DIAGNOSIS — F411 Generalized anxiety disorder: Secondary | ICD-10-CM | POA: Diagnosis not present

## 2023-10-29 DIAGNOSIS — E039 Hypothyroidism, unspecified: Secondary | ICD-10-CM | POA: Diagnosis not present

## 2023-10-29 MED ORDER — POTASSIUM CHLORIDE ER 10 MEQ PO CPCR
10.0000 meq | ORAL_CAPSULE | Freq: Two times a day (BID) | ORAL | 0 refills | Status: DC
  Filled 2023-10-29 – 2023-10-30 (×2): qty 60, 30d supply, fill #0

## 2023-10-29 MED ORDER — LOSARTAN POTASSIUM-HCTZ 50-12.5 MG PO TABS
1.0000 | ORAL_TABLET | Freq: Every day | ORAL | 1 refills | Status: DC
  Filled 2023-12-09: qty 90, 90d supply, fill #0
  Filled 2024-04-02: qty 90, 90d supply, fill #1
  Filled 2024-04-04: qty 90, 90d supply, fill #0

## 2023-10-29 MED ORDER — CYANOCOBALAMIN 1000 MCG/ML IJ SOLN
1000.0000 ug | INTRAMUSCULAR | 1 refills | Status: DC
Start: 2023-10-29 — End: 2024-04-04
  Filled 2023-10-29 – 2023-10-30 (×2): qty 3, 90d supply, fill #0
  Filled 2023-12-09 – 2024-01-25 (×2): qty 3, 90d supply, fill #1

## 2023-10-29 MED ORDER — FUROSEMIDE 20 MG PO TABS
20.0000 mg | ORAL_TABLET | Freq: Every day | ORAL | 1 refills | Status: AC | PRN
  Filled 2023-12-09 – 2024-04-04 (×3): qty 90, 90d supply, fill #0

## 2023-10-29 MED ORDER — VALACYCLOVIR HCL 500 MG PO TABS
500.0000 mg | ORAL_TABLET | Freq: Every day | ORAL | 1 refills | Status: DC
  Filled 2023-12-09: qty 90, 90d supply, fill #0

## 2023-10-29 MED ORDER — ESOMEPRAZOLE MAGNESIUM 40 MG PO CPDR
40.0000 mg | DELAYED_RELEASE_CAPSULE | Freq: Every morning | ORAL | 1 refills | Status: AC
Start: 2023-10-29 — End: ?
  Filled 2023-12-09 – 2024-04-02 (×2): qty 90, 90d supply, fill #0

## 2023-10-29 MED ORDER — PITAVASTATIN CALCIUM 4 MG PO TABS
4.0000 mg | ORAL_TABLET | Freq: Every day | ORAL | 1 refills | Status: AC
  Filled 2023-12-09 – 2024-04-04 (×3): qty 90, 90d supply, fill #0

## 2023-10-29 MED ORDER — ALPRAZOLAM 0.5 MG PO TABS
0.5000 mg | ORAL_TABLET | Freq: Two times a day (BID) | ORAL | 1 refills | Status: DC
  Filled 2023-11-25: qty 180, 90d supply, fill #0
  Filled 2024-04-04: qty 180, 90d supply, fill #1
  Filled 2024-04-04: qty 180, 90d supply, fill #0

## 2023-10-29 MED ORDER — SYNTHROID 112 MCG PO TABS
112.0000 ug | ORAL_TABLET | Freq: Every morning | ORAL | 1 refills | Status: AC
  Filled 2023-12-09 – 2024-01-25 (×2): qty 90, 90d supply, fill #0
  Filled 2024-04-02: qty 90, 90d supply, fill #1

## 2023-10-29 MED ORDER — ZOLPIDEM TARTRATE 10 MG PO TABS
10.0000 mg | ORAL_TABLET | Freq: Every evening | ORAL | 1 refills | Status: AC | PRN
  Filled 2023-11-13: qty 90, 90d supply, fill #0
  Filled 2024-01-25 – 2024-01-29 (×2): qty 90, 90d supply, fill #1
  Filled 2024-02-08: qty 90, 90d supply, fill #0

## 2023-10-29 MED ORDER — MELOXICAM 15 MG PO TABS
15.0000 mg | ORAL_TABLET | Freq: Every day | ORAL | 0 refills | Status: DC
  Filled 2023-10-29 – 2023-10-30 (×2): qty 30, 30d supply, fill #0

## 2023-10-30 ENCOUNTER — Other Ambulatory Visit (HOSPITAL_COMMUNITY): Payer: Self-pay

## 2023-10-30 ENCOUNTER — Other Ambulatory Visit: Payer: Self-pay

## 2023-11-13 ENCOUNTER — Other Ambulatory Visit (HOSPITAL_COMMUNITY): Payer: Self-pay

## 2023-11-23 ENCOUNTER — Other Ambulatory Visit (HOSPITAL_COMMUNITY): Payer: Self-pay

## 2023-11-25 ENCOUNTER — Other Ambulatory Visit: Payer: Self-pay

## 2023-11-25 ENCOUNTER — Other Ambulatory Visit (HOSPITAL_COMMUNITY): Payer: Self-pay

## 2023-12-09 ENCOUNTER — Other Ambulatory Visit: Payer: Self-pay

## 2023-12-09 ENCOUNTER — Other Ambulatory Visit (HOSPITAL_COMMUNITY): Payer: Self-pay

## 2023-12-09 MED ORDER — POTASSIUM CHLORIDE ER 10 MEQ PO CPCR
10.0000 meq | ORAL_CAPSULE | Freq: Two times a day (BID) | ORAL | 0 refills | Status: DC
  Filled 2023-12-09: qty 180, 90d supply, fill #0

## 2023-12-09 MED ORDER — SYNTHROID 112 MCG PO TABS
112.0000 ug | ORAL_TABLET | Freq: Once | ORAL | 2 refills | Status: AC
  Filled 2023-12-09 – 2024-04-02 (×2): qty 90, 90d supply, fill #0

## 2023-12-09 MED ORDER — ESOMEPRAZOLE MAGNESIUM 40 MG PO CPDR
40.0000 mg | DELAYED_RELEASE_CAPSULE | Freq: Every day | ORAL | 0 refills | Status: DC
  Filled 2023-12-09: qty 90, 90d supply, fill #0

## 2023-12-10 ENCOUNTER — Other Ambulatory Visit: Payer: Self-pay

## 2023-12-21 DIAGNOSIS — Z6820 Body mass index (BMI) 20.0-20.9, adult: Secondary | ICD-10-CM | POA: Diagnosis not present

## 2023-12-21 DIAGNOSIS — E039 Hypothyroidism, unspecified: Secondary | ICD-10-CM | POA: Diagnosis not present

## 2023-12-21 DIAGNOSIS — I10 Essential (primary) hypertension: Secondary | ICD-10-CM | POA: Diagnosis not present

## 2023-12-21 DIAGNOSIS — Z8639 Personal history of other endocrine, nutritional and metabolic disease: Secondary | ICD-10-CM | POA: Diagnosis not present

## 2023-12-21 DIAGNOSIS — F418 Other specified anxiety disorders: Secondary | ICD-10-CM | POA: Diagnosis not present

## 2023-12-21 DIAGNOSIS — E7800 Pure hypercholesterolemia, unspecified: Secondary | ICD-10-CM | POA: Diagnosis not present

## 2023-12-26 ENCOUNTER — Telehealth: Admitting: Nurse Practitioner

## 2023-12-26 DIAGNOSIS — J329 Chronic sinusitis, unspecified: Secondary | ICD-10-CM

## 2023-12-26 MED ORDER — CLINDAMYCIN HCL 300 MG PO CAPS: 300.0000 mg | ORAL_CAPSULE | Freq: Three times a day (TID) | ORAL | 0 refills | Status: AC

## 2023-12-26 NOTE — Progress Notes (Signed)
 E-Visit for Sinus Problems  We are sorry that you are not feeling well.  Here is how we plan to help!  Based on what you have shared with me it looks like you have sinusitis.  Sinusitis is inflammation and infection in the sinus cavities of the head.  Based on your presentation I believe you most likely have Acute Bacterial Sinusitis.  This is an infection caused by bacteria and is treated with antibiotics. I have prescribed clindamycin  as you have requested.  You may use an oral decongestant such as Mucinex D or if you have glaucoma or high blood pressure use plain Mucinex. Saline nasal spray help and can safely be used as often as needed for congestion.  If you develop worsening sinus pain, fever or notice severe headache and vision changes, or if symptoms are not better after completion of antibiotic, please schedule an appointment with a health care provider.    Sinus infections are not as easily transmitted as other respiratory infection, however we still recommend that you avoid close contact with loved ones, especially the very young and elderly.  Remember to wash your hands thoroughly throughout the day as this is the number one way to prevent the spread of infection!  Home Care: Only take medications as instructed by your medical team. Complete the entire course of an antibiotic. Do not take these medications with alcohol. A steam or ultrasonic humidifier can help congestion.  You can place a towel over your head and breathe in the steam from hot water  coming from a faucet. Avoid close contacts especially the very young and the elderly. Cover your mouth when you cough or sneeze. Always remember to wash your hands.  Get Help Right Away If: You develop worsening fever or sinus pain. You develop a severe head ache or visual changes. Your symptoms persist after you have completed your treatment plan.  Make sure you Understand these instructions. Will watch your condition. Will get  help right away if you are not doing well or get worse.  Thank you for choosing an e-visit.  Your e-visit answers were reviewed by a board certified advanced clinical practitioner to complete your personal care plan. Depending upon the condition, your plan could have included both over the counter or prescription medications.  Please review your pharmacy choice. Make sure the pharmacy is open so you can pick up prescription now. If there is a problem, you may contact your provider through [employer] and have the prescription routed to another pharmacy.  Your safety is important to us . If you have drug allergies check your prescription carefully.   For the next 24 hours you can use MyChart to ask questions about today's visit, request a non-urgent call back, or ask for a work or school excuse. You will get an email in the next two days asking about your experience. I hope that your e-visit has been valuable and will speed your recovery.

## 2023-12-26 NOTE — Progress Notes (Signed)
 I have spent 5 minutes in review of e-visit questionnaire, review and updating patient chart, medical decision making and response to patient.   Claiborne Rigg, NP

## 2024-01-25 ENCOUNTER — Other Ambulatory Visit (HOSPITAL_COMMUNITY): Payer: Self-pay

## 2024-01-25 ENCOUNTER — Other Ambulatory Visit: Payer: Self-pay

## 2024-01-26 ENCOUNTER — Other Ambulatory Visit: Payer: Self-pay

## 2024-01-29 ENCOUNTER — Other Ambulatory Visit (HOSPITAL_COMMUNITY): Payer: Self-pay

## 2024-02-02 DIAGNOSIS — M25511 Pain in right shoulder: Secondary | ICD-10-CM | POA: Diagnosis not present

## 2024-02-03 DIAGNOSIS — Z124 Encounter for screening for malignant neoplasm of cervix: Secondary | ICD-10-CM | POA: Diagnosis not present

## 2024-02-03 DIAGNOSIS — Z01419 Encounter for gynecological examination (general) (routine) without abnormal findings: Secondary | ICD-10-CM | POA: Diagnosis not present

## 2024-02-03 DIAGNOSIS — N760 Acute vaginitis: Secondary | ICD-10-CM | POA: Diagnosis not present

## 2024-02-03 DIAGNOSIS — Z6820 Body mass index (BMI) 20.0-20.9, adult: Secondary | ICD-10-CM | POA: Diagnosis not present

## 2024-02-03 DIAGNOSIS — R300 Dysuria: Secondary | ICD-10-CM | POA: Diagnosis not present

## 2024-02-03 DIAGNOSIS — J0120 Acute ethmoidal sinusitis, unspecified: Secondary | ICD-10-CM | POA: Diagnosis not present

## 2024-02-03 NOTE — Unmapped External Note (Signed)
 Assessment Note   Demographics Verification - Call Monitoring - Confidentiality      Member Verification: Member Verification    Call Monitoring Disclaimer: Call Monitoring Disclaimer         Person Providing Info on the Call   Who is the person providing the information on the call? Member      Limitations/Preferences   What, if any, physical limitations, health literacy, language needs and learning preferences do you have that I should be aware of when we talk?        Specify other language limitations        Specify other physical limitations        LCC Contact Type   Select the [facility] Your Way contact type: 4th or more contact-Telephonic Chronic Condition     New Hypertension Adult [place] Performance Guarantee                          General Health Perception   How would you describe your health in general? My health is good      SMART GOAL & SMART GOAL Follow Up     SMART GOAL    Did the member set a SMART goal? Yes  Did the member meet their previous SMART goal?  Yes      Brief Summary of Member Status   What medical conditions has your doctor diagnosed you with?    Past Medical History:  Diagnosis Date  . GERD (gastroesophageal reflux disease)   . High cholesterol   . Hypertension   . Hypothyroidism   . Osteoarthritis   . Weight Mgt      Did the member score via Care Engine for any condition(s) that he/she did NOT confirm?    HYW:  What medical conditions are you most concerned about and why?    In between MD visits people often find a need to go to the ER or an Urgent Care facility.can you tell me if you had any concerns or issues in which you went to an ER or Urgent care in the last 12 months?      Condition Specific Metrics   Height/Weight/BMI Height: 5' 4/Weight: 116 lb 6.4 oz/BMI (Calculated): 20   Blood Pressure    Outcome Metrics  -     Check off the hypertension outcome metrics or issues that  were completed on this call:  Blood pressure control; Blood pressure at goal         Medications    Current Outpatient Medications:  .  alprazolam  (XANAX ) 0.5 MG tablet, 0.5 mg nightly as needed, Disp: , Rfl:  .  conjugated estrogens  (Premarin ) 0.625 mg/gram vaginal cream, Insert into the vagina 2 (two) times a week, Disp: , Rfl:  .  esomeprazole  (NexIUM ) 40 MG capsule, as needed, Disp: , Rfl:  .  furosemide  (LASIX ) 20 MG tablet, Take 20 mg by mouth as needed, Disp: , Rfl:  .  levothyroxine  (SYNTHROID ) 125 MCG tablet, Take 125 mcg by mouth daily synthroid , Disp: , Rfl:  .  losartan -hydrochlorothiazide  (HYZAAR ) 50-12.5 mg tablet, Take 1 tablet by mouth daily, Disp: , Rfl:  .  MAGNESIUM  CARBONATE ORAL, Take by mouth, Disp: , Rfl:  .  pitavastatin  calcium  (LIVALO  ORAL), Take by mouth, Disp: , Rfl:  .  tirzepatide  (MOUNJARO  SUBQ), Inject subcutaneously, Disp: , Rfl:  .  valACYclovir  (VALTREX ) 1000 MG tablet, Take 1,000 mg by mouth 2 (two) times a day, Disp: ,  Rfl:  .  zolpidem  (AMBIEN ) 10 mg tablet, Take 10 mg by mouth, Disp: , Rfl:  .  amoxicillin-clavulanate (AUGMENTIN) 250-125 mg tablet, Take 1 tablet by mouth 3 (three) times a day, Disp: , Rfl:  .  cholecalciferol , vitamin D3, (VITAMIN D3 ORAL), Take by mouth (Patient taking differently: Take 1 capsule by mouth), Disp: , Rfl:  .  ubidecarenone (CO Q-10 ORAL), Take by mouth, Disp: , Rfl:     There are no discontinued medications.     Medication Review   Medication Review  Does the member meet any of the following criteria to trigger a medication review?: More than 3 months since last medication review ASK IF FULL MEDICATION REVIEW INDICATED: Is there any medication that your doctor has prescribed for you that you have never filled?: No ASK IF FULL MEDICATION REVIEW INDICATED: What issues, if any, sometimes interfere with your taking your medications on time or as prescribed?: Forgetting to take Grapefruit juice can affect some  medications. Do you drink grapefruit juice or eat grapefruits?: No     Depression Screening PHQ2/PHQ9 Exclusions   PHQ2 EXCLUSION CRITERIA:  Do NOT administer the PHQ-2 if any of the following apply to this member.   PHQ9 EXCLUSION CRITERIA: To determine if this assessment is right for you, I need to ask you some questions before we start.  Besides depression, have you been diagnosed with any other mental health issues such as: No applicable exclusions              Depression PHQ2/PHQ9 Screening Results     PHQ2  The PHQ-2 questions are scored from 0 (not at all) to 3 (nearly every day) and then added together: Score 0-2 = Not likely to be at risk for depression, Score 3-6 = At risk for depression (further screening recommended).        PHQ9 PHQ-9 Mini Module-If diagnosis of depression- show confirmed dx of depression and show PHQ-9 score & score description        Social Drivers of Health   Housing Stability: Low Risk  (12/02/2022)   Received from [facility] Stability Vital Sign   . What is your living situation today?: I have a steady place to live   . Think about the place you live. Do you have problems with any of the following? Choose all that apply:: None/None on this list  Food Insecurity: Low Risk  (12/02/2022)   Received from [facility]   Hunger Vital Sign   . Worried About Programme researcher, broadcasting/film/video in the Last Year: Never true   . Ran Out of Food in the Last Year: Never true  Tobacco Use: Low Risk  (05/18/2023)   Received from [facility]   Patient History   . Smoking Tobacco Use: Never   . Smokeless Tobacco Use: Never  Utilities: Low Risk  (12/02/2022)   Received from [facility]   Utilities   . In the past 12 months has the electric, gas, oil, or water  company threatened to shut off services in your home? : No      Lab Results   Results     There are no results available from this visit.         Immunizations   Immunizations Mini  Module (show all Q/A pairs answered during this encounter)       Intervention/Actions   Education Topics Discussed -     In the past 3 months or since our  last call, tell me about any of the following new or worsening symptoms of uncontrolled HTN or stroke/heart attack you have had  Member voices good understanding of warning signs   Document the hypertension education and interventions provided on this call:  KEY TOPIC - Lifestyle and blood pressure; KEY TOPIC - Medications for high blood pressure; Stress and blood pressure          Referrals & Referral Follow Up   Referrals    Flowsheet Row Most Recent Value  EAP   Did member accept referral? Yes  Referral modality Provided Contact Info to Member  Please document any referrals to other programs or resources that were made by the staff: EAP            MEP/Mobile App Registration & Education on Tools/Resources   Is the member registered on the Member Engagement Platform (MEP) or Mobile App?  Explain tools and resources available on Member Engagement Platform [facility]) and how to get the most benefit out of them.  Indicate resources reviewed with member: Yes-registered on [facility] 12/21/2023    Rewards Center (if applicable) Online resources (videos, recipes, etc.)    Next Scheduled Appointment      Date Provider Department Visit Type   06/22/2024 11:30 AM Leita Gouge Care Management [facility] Nurse Follow Up Assessment- 1st Attempt         Follow Up Needs Identified   Confirmed Conditions: HTN, High Cholesterol, Hypothyroidism, gerd, OA, Weight Mgt, hx of neck/back surgery, Renaud's , hx of hip surg, hx of multiple sinus surgeries- no longer sees ent for sinuses- sees pcp; (mbr disclosed) cardiac spastic syndrome. 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 Cares for granddaughter. Son does not have a job.  Was planning on retiring within a  few years but does not think she will be able to due to financially supporting child/grandchild. Support system good. Reviewed resources available, stress management techniques, EAP.  Mbr has done EAP before and would like to try again.  EAP # provided.    Reports shoulder pain, urinary tract infection. Recently saw ortho and gynecologist. Started Augmentin today for UTI. 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  Mbr states she has prednisone  and benadryl  if needed. MRI next week.  LDL elevated. HDL s good.  116.4 lbs.  Used to be 170 lbs. On Mounjaro  still. One inj every 9 days.  Minimal side effects.  Sees wellness Dr, in Sept. Will continue with Mounjaro  indefinitely due to issues with losing weight. Cost is high.  Using [employer].  Offered suggestion to check with manufacturer or physician for discount programs.  Last BP 100/70.  Trying to wean self-off Losartan , with MD permission.  Does have MD provided BP goal but no specific numbers provided. Mbr verbalized understanding of s/s to watch for.    Mbr mentions she is having hair loss and asks how to improve that.  Reviewed diet and provided education on importance of protein for preventing hair loss. Mbr VU, states friend who is nurse practitioner recommended a hair supplement. Encouraged mbr to discuss any supplements or other treatments with MD.    Chol checked in May 2025.  HDL was high per mbr last call. LDL elevated. Mbr does not recall numbers.  Repatha : CT  for calcium  score - mbr briefly mentions she saw provider but did not  mention the CT calcium  score specifically. Meds reviewed.  Not taking Repatha .  Taking Livalo .  Cannot take simvastatin due to history of rhabdomyolysis.  Medication and allergy review completed 02/03/24 HRA and Wellness visit completed for premium reduction   goal:  from now until next  call will schedule appt with EAP to reduce stress.  8/10.  Barrier: busy, remembering to schedule. Addressing - will put a reminder to call EAP where she can see it.    poc: htn, med review, stress management, mhq, obtain labs from November MD visit; FU EAP referral.

## 2024-02-08 ENCOUNTER — Other Ambulatory Visit: Payer: Self-pay

## 2024-02-09 ENCOUNTER — Other Ambulatory Visit (HOSPITAL_COMMUNITY): Payer: Self-pay

## 2024-02-11 DIAGNOSIS — M25511 Pain in right shoulder: Secondary | ICD-10-CM | POA: Diagnosis not present

## 2024-02-16 DIAGNOSIS — M25511 Pain in right shoulder: Secondary | ICD-10-CM | POA: Diagnosis not present

## 2024-02-24 DIAGNOSIS — Z8639 Personal history of other endocrine, nutritional and metabolic disease: Secondary | ICD-10-CM | POA: Diagnosis not present

## 2024-02-24 DIAGNOSIS — N952 Postmenopausal atrophic vaginitis: Secondary | ICD-10-CM | POA: Diagnosis not present

## 2024-02-24 DIAGNOSIS — E7800 Pure hypercholesterolemia, unspecified: Secondary | ICD-10-CM | POA: Diagnosis not present

## 2024-02-24 DIAGNOSIS — N390 Urinary tract infection, site not specified: Secondary | ICD-10-CM | POA: Diagnosis not present

## 2024-02-24 DIAGNOSIS — Z681 Body mass index (BMI) 19 or less, adult: Secondary | ICD-10-CM | POA: Diagnosis not present

## 2024-02-24 DIAGNOSIS — I10 Essential (primary) hypertension: Secondary | ICD-10-CM | POA: Diagnosis not present

## 2024-03-15 ENCOUNTER — Other Ambulatory Visit (HOSPITAL_COMMUNITY): Payer: Self-pay

## 2024-03-15 DIAGNOSIS — R399 Unspecified symptoms and signs involving the genitourinary system: Secondary | ICD-10-CM | POA: Diagnosis not present

## 2024-03-15 MED ORDER — FLUZONE 0.5 ML IM SUSY
0.5000 mL | PREFILLED_SYRINGE | Freq: Once | INTRAMUSCULAR | 0 refills | Status: AC
  Filled 2024-03-15: qty 0.5, 1d supply, fill #0

## 2024-03-26 ENCOUNTER — Encounter: Payer: Self-pay | Admitting: Physician Assistant

## 2024-03-26 ENCOUNTER — Telehealth: Admitting: Physician Assistant

## 2024-03-26 ENCOUNTER — Other Ambulatory Visit (HOSPITAL_COMMUNITY): Payer: Self-pay

## 2024-03-26 DIAGNOSIS — R399 Unspecified symptoms and signs involving the genitourinary system: Secondary | ICD-10-CM

## 2024-03-26 MED ORDER — PHENAZOPYRIDINE HCL 100 MG PO TABS
100.0000 mg | ORAL_TABLET | Freq: Three times a day (TID) | ORAL | 0 refills | Status: AC | PRN
  Filled 2024-03-26: qty 10, 4d supply, fill #0

## 2024-03-26 MED ORDER — CEFADROXIL 500 MG PO CAPS
500.0000 mg | ORAL_CAPSULE | Freq: Two times a day (BID) | ORAL | 0 refills | Status: AC
  Filled 2024-03-26: qty 14, 7d supply, fill #0

## 2024-03-26 MED ORDER — FLUCONAZOLE 150 MG PO TABS
150.0000 mg | ORAL_TABLET | Freq: Every day | ORAL | 0 refills | Status: DC
  Filled 2024-03-26: qty 1, 1d supply, fill #0

## 2024-03-26 NOTE — Patient Instructions (Signed)
 Murel GORMAN Harms, thank you for joining [name], PA-C for today's virtual visit.  While this provider is not your primary care provider (PCP), if your PCP is located in our provider database this encounter information will be shared with them immediately following your visit.   A [facility] MyChart account gives you access to today's visit and all your visits, tests, and labs performed at [facility]  click here if you don't have a [facility] MyChart account or go to [email or web address]  Consent: (Patient) Amber Werner provided verbal consent for this virtual visit at the beginning of the encounter.  Current Medications:  Current Outpatient Medications:    cefadroxil (DURICEF) 500 MG capsule, Take 1 capsule (500 mg total) by mouth 2 (two) times daily for 7 days., Disp: 14 capsule, Rfl: 0   fluconazole  (DIFLUCAN ) 150 MG tablet, Take 1 tablet (150 mg total) by mouth daily., Disp: 1 tablet, Rfl: 0   phenazopyridine (PYRIDIUM) 100 MG tablet, Take 1 tablet (100 mg total) by mouth 3 (three) times daily as needed for pain., Disp: 10 tablet, Rfl: 0   ALPRAZolam  (XANAX ) 0.5 MG tablet, Take 1 tablet by mouth twice daily as needed for anxiety., Disp: 60 tablet, Rfl: 3   ALPRAZolam  (XANAX ) 0.5 MG tablet, Take 1 tablet (0.5 mg total) by mouth 2 (two) times daily as needed., Disp: 60 tablet, Rfl: 3   ALPRAZolam  (XANAX ) 0.5 MG tablet, Take 1 tablet (0.5 mg total) by mouth 2 (two) times daily as needed., Disp: 60 tablet, Rfl: 2   ALPRAZolam  (XANAX ) 0.5 MG tablet, Take 1 tablet (0.5 mg total) by mouth 2 (two) times daily as needed. 30 day supply., Disp: 30 tablet, Rfl: 3   ALPRAZolam  (XANAX ) 0.5 MG tablet, Take 1 tablet (0.5 mg total) by mouth 2 (two) times daily. DO NOT FILL UNTIL 11/25/2023, Disp: 180 tablet, Rfl: 1   ascorbic acid (VITAMIN C) 500 MG tablet, Take 500 mg by mouth daily. (Patient not taking: Reported on 01/29/2022), Disp: , Rfl:    aspirin  EC 325 MG tablet, Take 1  tablet (325 mg total) by mouth 2 (two) times daily., Disp: 60 tablet, Rfl: 0   b complex vitamins capsule, Take 1 capsule by mouth daily., Disp: , Rfl:    bacitracin -polymyxin b , ophth, (POLYCIN ) OINT, Apply as directed twice daily., Disp: 3.5 g, Rfl: 1   conjugated estrogens  (PREMARIN ) vaginal cream, Place 1 Applicatorful vaginally once a week., Disp: , Rfl:    conjugated estrogens  (PREMARIN ) vaginal cream, Place vaginally 2 (two) times a week as directed., Disp: 30 g, Rfl: 3   conjugated estrogens  (PREMARIN ) vaginal cream, Apply as directed vaginally 2 (two) times a week., Disp: 30 g, Rfl: 3   conjugated estrogens  (PREMARIN ) vaginal cream, Apply as directed vaginally twice a week 30 days, Disp: 30 g, Rfl: 5   conjugated estrogens  (PREMARIN ) vaginal cream, Use vaginally 2 times weekly as directed, Disp: 30 g, Rfl: 5   conjugated estrogens  (PREMARIN ) vaginal cream, Place 0.5 g vaginally 2 (two) times a week., Disp: 30 g, Rfl: 2   cyanocobalamin  (VITAMIN B12) 1000 MCG/ML injection, Inject 1 mL  into the skin Once a week., Disp: 4 mL, Rfl: 3   cyanocobalamin  (VITAMIN B12) 1000 MCG/ML injection, Inject 1 mL (1,000 mcg total) into the skin once a week for 4 weeks, Disp: 4 mL, Rfl: 0   cyanocobalamin  (VITAMIN B12) 1000 MCG/ML injection, Inject 1 mL (1,000 mcg total) into the skin every 30 (thirty) days., Disp:  3 mL, Rfl: 1   doxycycline  (VIBRA -TABS) 100 MG tablet, Take 1 tablet (100 mg total) by mouth 2 (two) times daily., Disp: 20 tablet, Rfl: 0   esomeprazole  (NEXIUM ) 40 MG capsule, Take 40 mg by mouth daily as needed for heartburn., Disp: , Rfl:    esomeprazole  (NEXIUM ) 40 MG capsule, Take 1 capule by mouth once a day., Disp: 90 capsule, Rfl: 2   esomeprazole  (NEXIUM ) 40 MG capsule, Take 1 capsule (40 mg total) by mouth daily., Disp: 90 capsule, Rfl: 2   esomeprazole  (NEXIUM ) 40 MG capsule, Take 1 capsule (40 mg total) by mouth daily 1/2 to 1 hour before before morning meal, Disp: 90 capsule, Rfl: 1    esomeprazole  (NEXIUM ) 40 MG capsule, Take 1 capsule (40 mg total) by mouth 1/2 to 1 hour before morning meal, Disp: 90 capsule, Rfl: 1   esomeprazole  (NEXIUM ) 40 MG capsule, Take 1 capsule (40 mg total) by mouth 1/2 to 1 hour before morning meal., Disp: 90 capsule, Rfl: 0   Evolocumab  (REPATHA  SURECLICK) 140 MG/ML SOAJ, Inject 1ml (140 mg) into the skin every 14 (fourteen) days., Disp: 6 mL, Rfl: 3   fluticasone  (FLONASE ) 50 MCG/ACT nasal spray, Place 1 spray into both nostrils 2 (two) times daily., Disp: 16 g, Rfl: 3   fluticasone  (FLONASE ) 50 MCG/ACT nasal spray, Place 1 spray into both nostrils 2 (two) times daily., Disp: 16 g, Rfl: 3   furosemide  (LASIX ) 20 MG tablet, Take 20 mg by mouth daily as needed for edema., Disp: , Rfl:    furosemide  (LASIX ) 20 MG tablet, Take 1 tablet (20 mg total) by mouth daily as needed., Disp: 90 tablet, Rfl: 1   furosemide  (LASIX ) 20 MG tablet, Take 1 tablet (20 mg total) by mouth daily as needed., Disp: 90 tablet, Rfl: 1   Insulin  Pen Needle (TECHLITE PEN NEEDLES) 32G X 4 MM MISC, Use as directed with Saxenda  daily, Disp: 100 each, Rfl: 0   levocetirizine (XYZAL ) 5 MG tablet, Take 5 mg by mouth at bedtime. OCC TAKES BID, Disp: , Rfl:    levothyroxine  (SYNTHROID ) 125 MCG tablet, Take 1 tablet (125 mcg total) by mouth daily before breakfast. NEED NAME BRAND, Disp: 90 tablet, Rfl: 1   losartan -hydrochlorothiazide  (HYZAAR ) 100-25 MG tablet, Take 1 tablet by mouth daily., Disp: 90 tablet, Rfl: 1   losartan -hydrochlorothiazide  (HYZAAR ) 50-12.5 MG per tablet, TAKE 1 TABLET BY MOUTH DAILY. (Patient taking differently: Take 1 tablet by mouth daily.), Disp: 90 tablet, Rfl: 0   losartan -hydrochlorothiazide  (HYZAAR ) 50-12.5 MG tablet, Take 1 tablet by mouth daily., Disp: 90 tablet, Rfl: 1   losartan -hydrochlorothiazide  (HYZAAR ) 50-12.5 MG tablet, Take 1 tablet by mouth daily., Disp: 90 tablet, Rfl: 1   losartan -hydrochlorothiazide  (HYZAAR ) 50-12.5 MG tablet, Take 1 tablet by  mouth daily., Disp: 90 tablet, Rfl: 1   losartan -hydrochlorothiazide  (HYZAAR ) 50-12.5 MG tablet, Take 1 tablet by mouth daily., Disp: 90 tablet, Rfl: 1   Magnesium  400 MG TABS, Take 400 mg by mouth at bedtime., Disp: , Rfl:    meloxicam  (MOBIC ) 15 MG tablet, Take 1 tablet (15 mg total) by mouth daily., Disp: 30 tablet, Rfl: 0   metoprolol  tartrate (LOPRESSOR ) 50 MG tablet, Take 1 tablet (50 mg total) by mouth two hours prior to CT test as directed., Disp: 1 tablet, Rfl: 0   mupirocin  ointment (BACTROBAN ) 2 %, Apply liberally to affected area twice a day, Disp: 22 g, Rfl: 2   nitroGLYCERIN  (NITROSTAT ) 0.4 MG SL tablet, Place 1 tablet (0.4  mg total) under the tongue every 5 (five) minutes as needed for chest pain. (Patient not taking: Reported on 01/29/2022), Disp: 30 tablet, Rfl: 6   Pitavastatin  Calcium  (LIVALO ) 4 MG TABS, Take 1 tablet by mouth once a day., Disp: 90 tablet, Rfl: 2   Pitavastatin  Calcium  (LIVALO ) 4 MG TABS, Take 1 tablet (4 mg total) by mouth daily., Disp: 90 tablet, Rfl: 2   Pitavastatin  Calcium  (LIVALO ) 4 MG TABS, Take 1 tablet (4 mg total) by mouth daily., Disp: 90 tablet, Rfl: 1   Pitavastatin  Calcium  (LIVALO ) 4 MG TABS, Take 1 tablet (4 mg total) by mouth daily., Disp: 90 tablet, Rfl: 1   Pitavastatin  Calcium  (LIVALO ) 4 MG TABS, Take 1 tablet (4 mg total) by mouth daily., Disp: 90 tablet, Rfl: 1   potassium chloride  (MICRO-K ) 10 MEQ CR capsule, Take 1 capsule (10 mEq total) by mouth 2 (two) times daily with food., Disp: 180 capsule, Rfl: 0   sennosides-docusate sodium  (SENOKOT-S) 8.6-50 MG tablet, Take 2 tablets by mouth daily. (Patient taking differently: Take 2 tablets by mouth daily as needed for constipation.), Disp: 30 tablet, Rfl: 1   SYNTHROID  112 MCG tablet, Take 1 tablet (112 mcg total) by mouth in the morning on an empty stomach, Disp: 90 tablet, Rfl: 1   SYNTHROID  112 MCG tablet, Take 1 tablet (112 mcg total) by mouth in the morning on an empty stomach., Disp: 90  tablet, Rfl: 1   SYNTHROID  112 MCG tablet, Take 1 tablet (112 mcg total) by mouth in the morning on an empty stomach, Disp: 90 tablet, Rfl: 1   SYNTHROID  112 MCG tablet, Take 1 tablet (112 mcg total) by mouth every morning  on an empty stomach.., Disp: 90 tablet, Rfl: 1   SYNTHROID  112 MCG tablet, Take 1 tablet (112 mcg total) by mouth every morning on an empty stomach., Disp: 90 tablet, Rfl: 1   SYNTHROID  112 MCG tablet, Take 1 tablet (112 mcg total) by mouth in the morning on an empty stomach. DOSE CHANGE DAW 1, Disp: 90 tablet, Rfl: 2   tirzepatide  (MOUNJARO ) 10 MG/0.5ML Pen, Inject 10 mg into the skin once a week., Disp: 2 mL, Rfl: 0   valACYclovir  (VALTREX ) 1000 MG tablet, Take 1 tablet (1,000 mg total) by mouth daily as needed., Disp: 30 tablet, Rfl: 6   valACYclovir  (VALTREX ) 500 MG tablet, Take 1 tablet by mouth once daily., Disp: 90 tablet, Rfl: 2   valACYclovir  (VALTREX ) 500 MG tablet, Take 1 tablet (500 mg total) by mouth daily., Disp: 90 tablet, Rfl: 2   valACYclovir  (VALTREX ) 500 MG tablet, Take 1 tablet (500 mg total) by mouth daily., Disp: 90 tablet, Rfl: 1   valACYclovir  (VALTREX ) 500 MG tablet, Take 1 tablet (500 mg total) by mouth daily., Disp: 90 tablet, Rfl: 1   valACYclovir  (VALTREX ) 500 MG tablet, Take 1 tablet (500 mg total) by mouth daily., Disp: 90 tablet, Rfl: 1   zolpidem  (AMBIEN ) 10 MG tablet, Take 10 mg by mouth at bedtime., Disp: , Rfl:    zolpidem  (AMBIEN ) 10 MG tablet, Take 1 tablet by mouth as needed at bedtime., Disp: 30 tablet, Rfl: 5   zolpidem  (AMBIEN ) 10 MG tablet, Take 1 tablet (10 ml) by mouth at bedtime as needed., Disp: 30 tablet, Rfl: 5   zolpidem  (AMBIEN ) 10 MG tablet, Take 1 tablet (10 mg total) by mouth at bedtime as needed., Disp: 30 tablet, Rfl: 3   zolpidem  (AMBIEN ) 10 MG tablet, Take 1 tablet (10 mg total)  by mouth at bedtime as needed.(DO NOT FILL UNTIL 11/10/2023), Disp: 90 tablet, Rfl: 1   Medications ordered in this encounter:  Meds ordered this  encounter  Medications   cefadroxil (DURICEF) 500 MG capsule    Sig: Take 1 capsule (500 mg total) by mouth 2 (two) times daily for 7 days.    Dispense:  14 capsule    Refill:  0    Supervising Provider:   BLAISE ALEENE KIDD [[id number]]   fluconazole  (DIFLUCAN ) 150 MG tablet    Sig: Take 1 tablet (150 mg total) by mouth daily.    Dispense:  1 tablet    Refill:  0    Supervising Provider:   LAMPTEY, PHILIP O [[id number]]   phenazopyridine (PYRIDIUM) 100 MG tablet    Sig: Take 1 tablet (100 mg total) by mouth 3 (three) times daily as needed for pain.    Dispense:  10 tablet    Refill:  0    Supervising Provider:   BLAISE ALEENE KIDD [[id number]]     *If you need refills on other medications prior to your next appointment, please contact your pharmacy*  Follow-Up: Call back or seek an in-person evaluation if the symptoms worsen or if the condition fails to improve as anticipated.  [facility] [phone]  Other Instructions Take medications as prescribed.  If no improvement please follow up with Primary Care Physician.    If you have been instructed to have an in-person evaluation today at a local Urgent Care facility, please use the link below. It will take you to a list of all of our available [facility], including address, phone number and hours of operation. Please do not delay care.  [facility]  If you or a family member do not have a primary care provider, use the link below to schedule a visit and establish care. When you choose a [facility] primary care physician or advanced practice provider, you gain a long-term partner in health. Find a Primary Care Provider  Learn more about [facility] in-office and virtual care options: [place] [facility] - Get Care Now

## 2024-03-26 NOTE — Progress Notes (Signed)
 Virtual Visit Consent   Amber Werner, you are scheduled for a virtual visit with a [facility] provider today. Just as with appointments in the office, your consent must be obtained to participate. Your consent will be active for this visit and any virtual visit you may have with one of our providers in the next 365 days. If you have a MyChart account, a copy of this consent can be sent to you electronically.  As this is a virtual visit, video technology does not allow for your provider to perform a traditional examination. This may limit your provider's ability to fully assess your condition. If your provider identifies any concerns that need to be evaluated in person or the need to arrange testing (such as labs, EKG, etc.), we will make arrangements to do so. Although advances in technology are sophisticated, we cannot ensure that it will always work on either your end or our end. If the connection with a video visit is poor, the visit may have to be switched to a telephone visit. With either a video or telephone visit, we are not always able to ensure that we have a secure connection.  By engaging in this virtual visit, you consent to the provision of healthcare and authorize for your insurance to be billed (if applicable) for the services provided during this visit. Depending on your insurance coverage, you may receive a charge related to this service.  I need to obtain your verbal consent now. Are you willing to proceed with your visit today? Amber Werner has provided verbal consent on 03/26/2024 for a virtual visit (video or telephone). Amber PEDLAR Ward, PA-C  Date: 03/26/2024 10:52 AM   Virtual Visit via Video Note   I, Amber Werner, connected with  Amber Werner  ([id number], [birth year]) on 03/26/24 at 10:30 AM EDT by a video-enabled telemedicine application and verified that I am speaking with the correct person using two identifiers.  Location: Patient: Virtual Visit Location  Patient: Home Provider: Virtual Visit Location Provider: Home Office   I discussed the limitations of evaluation and management by telemedicine and the availability of in person appointments. The patient expressed understanding and agreed to proceed.    History of Present Illness: Amber Werner is a 68 y.o. who identifies as a female who was assigned female at birth, and is being seen today for uti sx. Completed Macrobid  last week, sx returned last night. Experiencing dysuria, reports urinating pus, experiencing abdominal pain.  She did have a urine culture, I am unable to see these results.  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 Was able to review culture with pharmacist who recommended, Cefadroxil 500 mg bid for 7 days.   HPI: HPI  Problems:  Patient Active Problem List   Diagnosis Date Noted   Primary localized osteoarthritis of left hip 10/26/2018   S/P hip replacement, right 10/26/2018   History of shingles 01/14/2013   HTN (hypertension) 11/09/2011   Hypothyroidism 11/09/2011   Insomnia 11/09/2011   AR (allergic rhinitis) 11/09/2011   Cervical spondylosis 11/09/2011   Chronic hip pain 11/09/2011   MRSA infection 11/09/2011    Allergies:  Allergies  Allergen Reactions   Penicillins Anaphylaxis    Did it involve swelling of the face/tongue/throat, SOB, or low BP? Yes Did it involve sudden or severe rash/hives, skin peeling, or any reaction on the inside of your mouth or nose? No Did you need to seek medical attention at a hospital  or doctor's office? Yes When did it last happen?    childhood   If all above answers are "NO", may proceed with cephalosporin use.    Paxlovid [Nirmatrelvir-Ritonavir] Swelling    Swelling and blistering in her throat   Medications:  Current Outpatient Medications:    cefadroxil (DURICEF) 500 MG capsule, Take 1 capsule (500 mg total) by mouth 2 (two) times daily for 7 days., Disp: 14 capsule, Rfl: 0   fluconazole  (DIFLUCAN )  150 MG tablet, Take 1 tablet (150 mg total) by mouth daily., Disp: 1 tablet, Rfl: 0   phenazopyridine (PYRIDIUM) 100 MG tablet, Take 1 tablet (100 mg total) by mouth 3 (three) times daily as needed for pain., Disp: 10 tablet, Rfl: 0   ALPRAZolam  (XANAX ) 0.5 MG tablet, Take 1 tablet by mouth twice daily as needed for anxiety., Disp: 60 tablet, Rfl: 3   ALPRAZolam  (XANAX ) 0.5 MG tablet, Take 1 tablet (0.5 mg total) by mouth 2 (two) times daily as needed., Disp: 60 tablet, Rfl: 3   ALPRAZolam  (XANAX ) 0.5 MG tablet, Take 1 tablet (0.5 mg total) by mouth 2 (two) times daily as needed., Disp: 60 tablet, Rfl: 2   ALPRAZolam  (XANAX ) 0.5 MG tablet, Take 1 tablet (0.5 mg total) by mouth 2 (two) times daily as needed. 30 day supply., Disp: 30 tablet, Rfl: 3   ALPRAZolam  (XANAX ) 0.5 MG tablet, Take 1 tablet (0.5 mg total) by mouth 2 (two) times daily. DO NOT FILL UNTIL 11/25/2023, Disp: 180 tablet, Rfl: 1   ascorbic acid (VITAMIN C) 500 MG tablet, Take 500 mg by mouth daily. (Patient not taking: Reported on 01/29/2022), Disp: , Rfl:    aspirin  EC 325 MG tablet, Take 1 tablet (325 mg total) by mouth 2 (two) times daily., Disp: 60 tablet, Rfl: 0   b complex vitamins capsule, Take 1 capsule by mouth daily., Disp: , Rfl:    bacitracin -polymyxin b , ophth, (POLYCIN ) OINT, Apply as directed twice daily., Disp: 3.5 g, Rfl: 1   conjugated estrogens  (PREMARIN ) vaginal cream, Place 1 Applicatorful vaginally once a week., Disp: , Rfl:    conjugated estrogens  (PREMARIN ) vaginal cream, Place vaginally 2 (two) times a week as directed., Disp: 30 g, Rfl: 3   conjugated estrogens  (PREMARIN ) vaginal cream, Apply as directed vaginally 2 (two) times a week., Disp: 30 g, Rfl: 3   conjugated estrogens  (PREMARIN ) vaginal cream, Apply as directed vaginally twice a week 30 days, Disp: 30 g, Rfl: 5   conjugated estrogens  (PREMARIN ) vaginal cream, Use vaginally 2 times weekly as directed, Disp: 30 g, Rfl: 5   conjugated estrogens  (PREMARIN )  vaginal cream, Place 0.5 g vaginally 2 (two) times a week., Disp: 30 g, Rfl: 2   cyanocobalamin  (VITAMIN B12) 1000 MCG/ML injection, Inject 1 mL  into the skin Once a week., Disp: 4 mL, Rfl: 3   cyanocobalamin  (VITAMIN B12) 1000 MCG/ML injection, Inject 1 mL (1,000 mcg total) into the skin once a week for 4 weeks, Disp: 4 mL, Rfl: 0   cyanocobalamin  (VITAMIN B12) 1000 MCG/ML injection, Inject 1 mL (1,000 mcg total) into the skin every 30 (thirty) days., Disp: 3 mL, Rfl: 1   doxycycline  (VIBRA -TABS) 100 MG tablet, Take 1 tablet (100 mg total) by mouth 2 (two) times daily., Disp: 20 tablet, Rfl: 0   esomeprazole  (NEXIUM ) 40 MG capsule, Take 40 mg by mouth daily as needed for heartburn., Disp: , Rfl:    esomeprazole  (NEXIUM ) 40 MG capsule, Take 1 capule by mouth once a day.,  Disp: 90 capsule, Rfl: 2   esomeprazole  (NEXIUM ) 40 MG capsule, Take 1 capsule (40 mg total) by mouth daily., Disp: 90 capsule, Rfl: 2   esomeprazole  (NEXIUM ) 40 MG capsule, Take 1 capsule (40 mg total) by mouth daily 1/2 to 1 hour before before morning meal, Disp: 90 capsule, Rfl: 1   esomeprazole  (NEXIUM ) 40 MG capsule, Take 1 capsule (40 mg total) by mouth 1/2 to 1 hour before morning meal, Disp: 90 capsule, Rfl: 1   esomeprazole  (NEXIUM ) 40 MG capsule, Take 1 capsule (40 mg total) by mouth 1/2 to 1 hour before morning meal., Disp: 90 capsule, Rfl: 0   Evolocumab  (REPATHA  SURECLICK) 140 MG/ML SOAJ, Inject 1ml (140 mg) into the skin every 14 (fourteen) days., Disp: 6 mL, Rfl: 3   fluticasone  (FLONASE ) 50 MCG/ACT nasal spray, Place 1 spray into both nostrils 2 (two) times daily., Disp: 16 g, Rfl: 3   fluticasone  (FLONASE ) 50 MCG/ACT nasal spray, Place 1 spray into both nostrils 2 (two) times daily., Disp: 16 g, Rfl: 3   furosemide  (LASIX ) 20 MG tablet, Take 20 mg by mouth daily as needed for edema., Disp: , Rfl:    furosemide  (LASIX ) 20 MG tablet, Take 1 tablet (20 mg total) by mouth daily as needed., Disp: 90 tablet, Rfl: 1    furosemide  (LASIX ) 20 MG tablet, Take 1 tablet (20 mg total) by mouth daily as needed., Disp: 90 tablet, Rfl: 1   Insulin  Pen Needle (TECHLITE PEN NEEDLES) 32G X 4 MM MISC, Use as directed with Saxenda  daily, Disp: 100 each, Rfl: 0   levocetirizine (XYZAL ) 5 MG tablet, Take 5 mg by mouth at bedtime. OCC TAKES BID, Disp: , Rfl:    levothyroxine  (SYNTHROID ) 125 MCG tablet, Take 1 tablet (125 mcg total) by mouth daily before breakfast. NEED NAME BRAND, Disp: 90 tablet, Rfl: 1   losartan -hydrochlorothiazide  (HYZAAR ) 100-25 MG tablet, Take 1 tablet by mouth daily., Disp: 90 tablet, Rfl: 1   losartan -hydrochlorothiazide  (HYZAAR ) 50-12.5 MG per tablet, TAKE 1 TABLET BY MOUTH DAILY. (Patient taking differently: Take 1 tablet by mouth daily.), Disp: 90 tablet, Rfl: 0   losartan -hydrochlorothiazide  (HYZAAR ) 50-12.5 MG tablet, Take 1 tablet by mouth daily., Disp: 90 tablet, Rfl: 1   losartan -hydrochlorothiazide  (HYZAAR ) 50-12.5 MG tablet, Take 1 tablet by mouth daily., Disp: 90 tablet, Rfl: 1   losartan -hydrochlorothiazide  (HYZAAR ) 50-12.5 MG tablet, Take 1 tablet by mouth daily., Disp: 90 tablet, Rfl: 1   losartan -hydrochlorothiazide  (HYZAAR ) 50-12.5 MG tablet, Take 1 tablet by mouth daily., Disp: 90 tablet, Rfl: 1   Magnesium  400 MG TABS, Take 400 mg by mouth at bedtime., Disp: , Rfl:    meloxicam  (MOBIC ) 15 MG tablet, Take 1 tablet (15 mg total) by mouth daily., Disp: 30 tablet, Rfl: 0   metoprolol  tartrate (LOPRESSOR ) 50 MG tablet, Take 1 tablet (50 mg total) by mouth two hours prior to CT test as directed., Disp: 1 tablet, Rfl: 0   mupirocin  ointment (BACTROBAN ) 2 %, Apply liberally to affected area twice a day, Disp: 22 g, Rfl: 2   nitroGLYCERIN  (NITROSTAT ) 0.4 MG SL tablet, Place 1 tablet (0.4 mg total) under the tongue every 5 (five) minutes as needed for chest pain. (Patient not taking: Reported on 01/29/2022), Disp: 30 tablet, Rfl: 6   Pitavastatin  Calcium  (LIVALO ) 4 MG TABS, Take 1 tablet by mouth once  a day., Disp: 90 tablet, Rfl: 2   Pitavastatin  Calcium  (LIVALO ) 4 MG TABS, Take 1 tablet (4 mg total) by mouth daily., Disp:  90 tablet, Rfl: 2   Pitavastatin  Calcium  (LIVALO ) 4 MG TABS, Take 1 tablet (4 mg total) by mouth daily., Disp: 90 tablet, Rfl: 1   Pitavastatin  Calcium  (LIVALO ) 4 MG TABS, Take 1 tablet (4 mg total) by mouth daily., Disp: 90 tablet, Rfl: 1   Pitavastatin  Calcium  (LIVALO ) 4 MG TABS, Take 1 tablet (4 mg total) by mouth daily., Disp: 90 tablet, Rfl: 1   potassium chloride  (MICRO-K ) 10 MEQ CR capsule, Take 1 capsule (10 mEq total) by mouth 2 (two) times daily with food., Disp: 180 capsule, Rfl: 0   sennosides-docusate sodium  (SENOKOT-S) 8.6-50 MG tablet, Take 2 tablets by mouth daily. (Patient taking differently: Take 2 tablets by mouth daily as needed for constipation.), Disp: 30 tablet, Rfl: 1   SYNTHROID  112 MCG tablet, Take 1 tablet (112 mcg total) by mouth in the morning on an empty stomach, Disp: 90 tablet, Rfl: 1   SYNTHROID  112 MCG tablet, Take 1 tablet (112 mcg total) by mouth in the morning on an empty stomach., Disp: 90 tablet, Rfl: 1   SYNTHROID  112 MCG tablet, Take 1 tablet (112 mcg total) by mouth in the morning on an empty stomach, Disp: 90 tablet, Rfl: 1   SYNTHROID  112 MCG tablet, Take 1 tablet (112 mcg total) by mouth every morning  on an empty stomach.., Disp: 90 tablet, Rfl: 1   SYNTHROID  112 MCG tablet, Take 1 tablet (112 mcg total) by mouth every morning on an empty stomach., Disp: 90 tablet, Rfl: 1   SYNTHROID  112 MCG tablet, Take 1 tablet (112 mcg total) by mouth in the morning on an empty stomach. DOSE CHANGE DAW 1, Disp: 90 tablet, Rfl: 2   tirzepatide  (MOUNJARO ) 10 MG/0.5ML Pen, Inject 10 mg into the skin once a week., Disp: 2 mL, Rfl: 0   valACYclovir  (VALTREX ) 1000 MG tablet, Take 1 tablet (1,000 mg total) by mouth daily as needed., Disp: 30 tablet, Rfl: 6   valACYclovir  (VALTREX ) 500 MG tablet, Take 1 tablet by mouth once daily., Disp: 90 tablet, Rfl:  2   valACYclovir  (VALTREX ) 500 MG tablet, Take 1 tablet (500 mg total) by mouth daily., Disp: 90 tablet, Rfl: 2   valACYclovir  (VALTREX ) 500 MG tablet, Take 1 tablet (500 mg total) by mouth daily., Disp: 90 tablet, Rfl: 1   valACYclovir  (VALTREX ) 500 MG tablet, Take 1 tablet (500 mg total) by mouth daily., Disp: 90 tablet, Rfl: 1   valACYclovir  (VALTREX ) 500 MG tablet, Take 1 tablet (500 mg total) by mouth daily., Disp: 90 tablet, Rfl: 1   zolpidem  (AMBIEN ) 10 MG tablet, Take 10 mg by mouth at bedtime., Disp: , Rfl:    zolpidem  (AMBIEN ) 10 MG tablet, Take 1 tablet by mouth as needed at bedtime., Disp: 30 tablet, Rfl: 5   zolpidem  (AMBIEN ) 10 MG tablet, Take 1 tablet (10 ml) by mouth at bedtime as needed., Disp: 30 tablet, Rfl: 5   zolpidem  (AMBIEN ) 10 MG tablet, Take 1 tablet (10 mg total) by mouth at bedtime as needed., Disp: 30 tablet, Rfl: 3   zolpidem  (AMBIEN ) 10 MG tablet, Take 1 tablet (10 mg total) by mouth at bedtime as needed.(DO NOT FILL UNTIL 11/10/2023), Disp: 90 tablet, Rfl: 1  Observations/Objective: Patient is well-developed, well-nourished in no acute distress.  Resting comfortably at home.  Head is normocephalic, atraumatic.  No labored breathing.  Speech is clear and coherent with logical content.  Patient is alert and oriented at baseline.    Assessment and Plan: 1. UTI  symptoms (Primary)  Medications sent to hospital pharmacy as pt is currently working, pharmacist ok with this. Advised further evaluation in person if no improvement.   Follow Up Instructions: I discussed the assessment and treatment plan with the patient. The patient was provided an opportunity to ask questions and all were answered. The patient agreed with the plan and demonstrated an understanding of the instructions.  A copy of instructions were sent to the patient via MyChart unless otherwise noted below.     The patient was advised to call back or seek an in-person evaluation if the symptoms  worsen or if the condition fails to improve as anticipated.    Amber PEDLAR Ward, PA-C

## 2024-04-02 ENCOUNTER — Other Ambulatory Visit (HOSPITAL_COMMUNITY): Payer: Self-pay

## 2024-04-03 ENCOUNTER — Other Ambulatory Visit (HOSPITAL_COMMUNITY): Payer: Self-pay

## 2024-04-04 ENCOUNTER — Other Ambulatory Visit (HOSPITAL_COMMUNITY): Payer: Self-pay

## 2024-04-04 ENCOUNTER — Other Ambulatory Visit: Payer: Self-pay

## 2024-04-04 DIAGNOSIS — B3731 Acute candidiasis of vulva and vagina: Secondary | ICD-10-CM | POA: Diagnosis not present

## 2024-04-04 DIAGNOSIS — R1030 Lower abdominal pain, unspecified: Secondary | ICD-10-CM | POA: Diagnosis not present

## 2024-04-04 DIAGNOSIS — R300 Dysuria: Secondary | ICD-10-CM | POA: Diagnosis not present

## 2024-04-04 MED ORDER — SYNTHROID 112 MCG PO TABS
112.0000 ug | ORAL_TABLET | Freq: Every day | ORAL | 1 refills | Status: AC
  Filled 2024-04-04 – 2024-04-08 (×2): qty 90, 90d supply, fill #0
  Filled 2024-07-19: qty 90, 90d supply, fill #1

## 2024-04-04 MED ORDER — CYANOCOBALAMIN 1000 MCG/ML IJ SOLN
INTRAMUSCULAR | 0 refills | Status: AC
  Filled 2024-04-04 – 2024-06-22 (×2): qty 3, 90d supply, fill #0

## 2024-04-04 MED ORDER — PREMARIN 0.625 MG/GM VA CREA
TOPICAL_CREAM | VAGINAL | 2 refills | Status: AC
  Filled 2024-04-04: qty 30, 84d supply, fill #0

## 2024-04-04 MED ORDER — MELOXICAM 15 MG PO TABS
15.0000 mg | ORAL_TABLET | Freq: Every day | ORAL | 0 refills | Status: AC
  Filled 2024-04-04: qty 30, 30d supply, fill #0

## 2024-04-05 ENCOUNTER — Other Ambulatory Visit (HOSPITAL_COMMUNITY): Payer: Self-pay

## 2024-04-05 ENCOUNTER — Other Ambulatory Visit: Payer: Self-pay

## 2024-04-05 MED ORDER — SYNTHROID 112 MCG PO TABS
112.0000 ug | ORAL_TABLET | Freq: Every morning | ORAL | 1 refills | Status: AC
  Filled 2024-04-05 – 2024-04-08 (×2): qty 90, 90d supply, fill #0

## 2024-04-05 MED ORDER — PITAVASTATIN CALCIUM 4 MG PO TABS
4.0000 mg | ORAL_TABLET | Freq: Every day | ORAL | 1 refills | Status: AC
  Filled 2024-04-05: qty 90, 90d supply, fill #0

## 2024-04-07 ENCOUNTER — Other Ambulatory Visit (HOSPITAL_COMMUNITY): Payer: Self-pay

## 2024-04-08 ENCOUNTER — Other Ambulatory Visit: Payer: Self-pay

## 2024-04-12 DIAGNOSIS — E7800 Pure hypercholesterolemia, unspecified: Secondary | ICD-10-CM | POA: Diagnosis not present

## 2024-04-12 DIAGNOSIS — I10 Essential (primary) hypertension: Secondary | ICD-10-CM | POA: Diagnosis not present

## 2024-04-12 DIAGNOSIS — Z8639 Personal history of other endocrine, nutritional and metabolic disease: Secondary | ICD-10-CM | POA: Diagnosis not present

## 2024-04-12 DIAGNOSIS — Z6820 Body mass index (BMI) 20.0-20.9, adult: Secondary | ICD-10-CM | POA: Diagnosis not present

## 2024-04-26 ENCOUNTER — Encounter: Payer: Self-pay | Admitting: Urology

## 2024-04-26 ENCOUNTER — Other Ambulatory Visit (HOSPITAL_COMMUNITY): Payer: Self-pay

## 2024-04-26 ENCOUNTER — Ambulatory Visit: Admitting: Urology

## 2024-04-26 VITALS — BP 152/95 | HR 80 | Ht 60.0 in | Wt 119.0 lb

## 2024-04-26 DIAGNOSIS — R1024 Suprapubic pain: Secondary | ICD-10-CM

## 2024-04-26 DIAGNOSIS — N390 Urinary tract infection, site not specified: Secondary | ICD-10-CM | POA: Diagnosis not present

## 2024-04-26 DIAGNOSIS — Z87440 Personal history of urinary (tract) infections: Secondary | ICD-10-CM | POA: Diagnosis not present

## 2024-04-26 LAB — URINALYSIS, ROUTINE W REFLEX MICROSCOPIC
Bilirubin, UA: NEGATIVE
Glucose, UA: NEGATIVE
Ketones, UA: NEGATIVE
Leukocytes,UA: NEGATIVE
Nitrite, UA: NEGATIVE
Protein,UA: NEGATIVE
RBC, UA: NEGATIVE
Specific Gravity, UA: 1.015 (ref 1.005–1.030)
Urobilinogen, Ur: 0.2 mg/dL (ref 0.2–1.0)
pH, UA: 5.5 (ref 5.0–7.5)

## 2024-04-26 LAB — BLADDER SCAN AMB NON-IMAGING

## 2024-04-26 MED ORDER — FLUCONAZOLE 150 MG PO TABS
150.0000 mg | ORAL_TABLET | Freq: Once | ORAL | 1 refills | Status: AC
  Filled 2024-04-26 – 2024-04-27 (×2): qty 1, 1d supply, fill #0

## 2024-04-26 MED ORDER — NITROFURANTOIN MONOHYD MACRO 100 MG PO CAPS
100.0000 mg | ORAL_CAPSULE | Freq: Every day | ORAL | 2 refills | Status: AC
  Filled 2024-04-26 – 2024-04-27 (×2): qty 30, 30d supply, fill #0
  Filled 2024-06-22: qty 30, 30d supply, fill #1
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
## 2024-04-27 ENCOUNTER — Other Ambulatory Visit (HOSPITAL_COMMUNITY): Payer: Self-pay

## 2024-04-27 ENCOUNTER — Other Ambulatory Visit: Payer: Self-pay

## 2024-05-05 DIAGNOSIS — M25552 Pain in left hip: Secondary | ICD-10-CM | POA: Diagnosis not present

## 2024-05-05 DIAGNOSIS — M79605 Pain in left leg: Secondary | ICD-10-CM | POA: Diagnosis not present

## 2024-05-05 DIAGNOSIS — M545 Low back pain, unspecified: Secondary | ICD-10-CM | POA: Diagnosis not present

## 2024-05-17 ENCOUNTER — Other Ambulatory Visit (HOSPITAL_COMMUNITY): Payer: Self-pay

## 2024-05-17 ENCOUNTER — Ambulatory Visit (HOSPITAL_COMMUNITY)
Admission: RE | Admit: 2024-05-17 | Discharge: 2024-05-17 | Disposition: A | Discharge status: Home / Self Care | Source: Ambulatory Visit | Attending: Urology | Admitting: Urology

## 2024-05-17 DIAGNOSIS — N390 Urinary tract infection, site not specified: Secondary | ICD-10-CM | POA: Insufficient documentation

## 2024-05-17 DIAGNOSIS — K5730 Diverticulosis of large intestine without perforation or abscess without bleeding: Secondary | ICD-10-CM | POA: Diagnosis not present

## 2024-05-17 DIAGNOSIS — R1024 Suprapubic pain: Secondary | ICD-10-CM | POA: Diagnosis not present

## 2024-05-18 ENCOUNTER — Other Ambulatory Visit (HOSPITAL_COMMUNITY): Payer: Self-pay

## 2024-05-18 MED ORDER — CLINDAMYCIN HCL 300 MG PO CAPS
300.0000 mg | ORAL_CAPSULE | Freq: Three times a day (TID) | ORAL | 0 refills | Status: AC
  Filled 2024-05-18: qty 21, 7d supply, fill #0

## 2024-05-19 ENCOUNTER — Ambulatory Visit: Admitting: Urology

## 2024-05-19 ENCOUNTER — Encounter: Payer: Self-pay | Admitting: Urology

## 2024-05-19 ENCOUNTER — Other Ambulatory Visit (HOSPITAL_COMMUNITY): Payer: Self-pay

## 2024-05-19 VITALS — BP 136/80 | HR 72

## 2024-05-19 DIAGNOSIS — R1024 Suprapubic pain: Secondary | ICD-10-CM

## 2024-05-19 DIAGNOSIS — B009 Herpesviral infection, unspecified: Secondary | ICD-10-CM | POA: Diagnosis not present

## 2024-05-19 DIAGNOSIS — I10 Essential (primary) hypertension: Secondary | ICD-10-CM | POA: Diagnosis not present

## 2024-05-19 DIAGNOSIS — E039 Hypothyroidism, unspecified: Secondary | ICD-10-CM | POA: Diagnosis not present

## 2024-05-19 DIAGNOSIS — R399 Unspecified symptoms and signs involving the genitourinary system: Secondary | ICD-10-CM

## 2024-05-19 DIAGNOSIS — N390 Urinary tract infection, site not specified: Secondary | ICD-10-CM

## 2024-05-19 DIAGNOSIS — Z1231 Encounter for screening mammogram for malignant neoplasm of breast: Secondary | ICD-10-CM | POA: Diagnosis not present

## 2024-05-19 DIAGNOSIS — R300 Dysuria: Secondary | ICD-10-CM

## 2024-05-19 DIAGNOSIS — Z87440 Personal history of urinary (tract) infections: Secondary | ICD-10-CM

## 2024-05-19 DIAGNOSIS — G4709 Other insomnia: Secondary | ICD-10-CM | POA: Diagnosis not present

## 2024-05-19 DIAGNOSIS — E782 Mixed hyperlipidemia: Secondary | ICD-10-CM | POA: Diagnosis not present

## 2024-05-19 DIAGNOSIS — K219 Gastro-esophageal reflux disease without esophagitis: Secondary | ICD-10-CM | POA: Diagnosis not present

## 2024-05-19 DIAGNOSIS — R3911 Hesitancy of micturition: Secondary | ICD-10-CM | POA: Diagnosis not present

## 2024-05-19 DIAGNOSIS — Z6820 Body mass index (BMI) 20.0-20.9, adult: Secondary | ICD-10-CM | POA: Diagnosis not present

## 2024-05-19 DIAGNOSIS — J329 Chronic sinusitis, unspecified: Secondary | ICD-10-CM | POA: Diagnosis not present

## 2024-05-19 DIAGNOSIS — R7303 Prediabetes: Secondary | ICD-10-CM | POA: Diagnosis not present

## 2024-05-19 LAB — URINALYSIS, ROUTINE W REFLEX MICROSCOPIC
Bilirubin, UA: NEGATIVE
Glucose, UA: NEGATIVE
Leukocytes,UA: NEGATIVE
Nitrite, UA: NEGATIVE
Protein,UA: NEGATIVE
RBC, UA: NEGATIVE
Specific Gravity, UA: 1.015 (ref 1.005–1.030)
Urobilinogen, Ur: 0.2 mg/dL (ref 0.2–1.0)
pH, UA: 7 (ref 5.0–7.5)

## 2024-05-19 MED ORDER — SYNTHROID 112 MCG PO TABS
112.0000 ug | ORAL_TABLET | Freq: Every morning | ORAL | 1 refills | Status: AC
  Filled 2024-05-19: qty 90, 90d supply, fill #0

## 2024-05-19 MED ORDER — ALPRAZOLAM 0.5 MG PO TABS
0.5000 mg | ORAL_TABLET | Freq: Two times a day (BID) | ORAL | 1 refills | Status: AC
  Filled 2024-07-19: qty 180, 90d supply, fill #0

## 2024-05-19 MED ORDER — FLUCONAZOLE 150 MG PO TABS
150.0000 mg | ORAL_TABLET | Freq: Once | ORAL | 1 refills | Status: AC
  Filled 2024-05-19: qty 1, 1d supply, fill #0
  Filled 2024-06-22: qty 1, 1d supply, fill #1

## 2024-05-19 MED ORDER — VALACYCLOVIR HCL 500 MG PO TABS
500.0000 mg | ORAL_TABLET | Freq: Every day | ORAL | 1 refills | Status: AC
  Filled 2024-05-19: qty 90, 90d supply, fill #0
  Filled 2024-06-22: qty 90, 90d supply, fill #1

## 2024-05-19 MED ORDER — ZOLPIDEM TARTRATE 10 MG PO TABS
10.0000 mg | ORAL_TABLET | Freq: Every evening | ORAL | 1 refills | Status: AC | PRN
  Filled 2024-05-19: qty 90, 90d supply, fill #0

## 2024-05-19 MED ORDER — LOSARTAN POTASSIUM-HCTZ 50-12.5 MG PO TABS
1.0000 | ORAL_TABLET | Freq: Every day | ORAL | 1 refills | Status: AC
  Filled 2024-06-22: qty 90, 90d supply, fill #0

## 2024-05-19 MED ORDER — GEMTESA 75 MG PO TABS: 75.0000 mg | ORAL_TABLET | Freq: Every day | ORAL | Status: AC

## 2024-05-19 MED ORDER — FUROSEMIDE 20 MG PO TABS
20.0000 mg | ORAL_TABLET | Freq: Every day | ORAL | 1 refills | Status: AC | PRN
  Filled 2024-06-22: qty 90, 90d supply, fill #0

## 2024-05-19 MED ORDER — PITAVASTATIN CALCIUM 4 MG PO TABS
4.0000 mg | ORAL_TABLET | Freq: Every day | ORAL | 1 refills | Status: AC
  Filled 2024-05-19: qty 90, 90d supply, fill #0
  Filled 2024-06-22 (×2): qty 30, 30d supply, fill #0
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
## 2024-05-23 ENCOUNTER — Ambulatory Visit: Payer: Self-pay | Admitting: Urology

## 2024-06-11 DIAGNOSIS — S99922A Unspecified injury of left foot, initial encounter: Secondary | ICD-10-CM | POA: Diagnosis not present

## 2024-06-11 DIAGNOSIS — X501XXA Overexertion from prolonged static or awkward postures, initial encounter: Secondary | ICD-10-CM | POA: Diagnosis not present

## 2024-06-11 DIAGNOSIS — M7732 Calcaneal spur, left foot: Secondary | ICD-10-CM | POA: Diagnosis not present

## 2024-06-11 DIAGNOSIS — S9032XA Contusion of left foot, initial encounter: Secondary | ICD-10-CM | POA: Diagnosis not present

## 2024-06-11 DIAGNOSIS — Z0130 Encounter for examination of blood pressure without abnormal findings: Secondary | ICD-10-CM | POA: Diagnosis not present

## 2024-06-11 DIAGNOSIS — W01198A Fall on same level from slipping, tripping and stumbling with subsequent striking against other object, initial encounter: Secondary | ICD-10-CM | POA: Diagnosis not present

## 2024-06-12 DIAGNOSIS — Z0130 Encounter for examination of blood pressure without abnormal findings: Secondary | ICD-10-CM | POA: Diagnosis not present

## 2024-06-12 DIAGNOSIS — S9032XA Contusion of left foot, initial encounter: Secondary | ICD-10-CM | POA: Diagnosis not present

## 2024-06-12 DIAGNOSIS — W01198A Fall on same level from slipping, tripping and stumbling with subsequent striking against other object, initial encounter: Secondary | ICD-10-CM | POA: Diagnosis not present

## 2024-06-14 ENCOUNTER — Other Ambulatory Visit: Admitting: Urology

## 2024-06-15 ENCOUNTER — Encounter: Payer: Self-pay | Admitting: Orthopaedic Surgery

## 2024-06-15 ENCOUNTER — Ambulatory Visit
Admission: RE | Admit: 2024-06-15 | Discharge: 2024-06-15 | Disposition: A | Discharge status: Home / Self Care | Source: Ambulatory Visit | Attending: Orthopaedic Surgery | Admitting: Orthopaedic Surgery

## 2024-06-15 ENCOUNTER — Other Ambulatory Visit: Payer: Self-pay | Admitting: Orthopaedic Surgery

## 2024-06-15 DIAGNOSIS — S92025A Nondisplaced fracture of anterior process of left calcaneus, initial encounter for closed fracture: Secondary | ICD-10-CM

## 2024-06-22 ENCOUNTER — Other Ambulatory Visit (HOSPITAL_COMMUNITY): Payer: Self-pay

## 2024-06-23 ENCOUNTER — Other Ambulatory Visit: Payer: Self-pay

## 2024-06-23 ENCOUNTER — Other Ambulatory Visit (HOSPITAL_COMMUNITY): Payer: Self-pay

## 2024-06-23 ENCOUNTER — Encounter: Payer: Self-pay | Admitting: Pharmacist

## 2024-06-29 ENCOUNTER — Other Ambulatory Visit (HOSPITAL_COMMUNITY): Payer: Self-pay

## 2024-06-29 MED ORDER — ZEPBOUND 15 MG/0.5ML ~~LOC~~ SOLN: 15.0000 mg | SUBCUTANEOUS | 1 refills | Status: AC

## 2024-07-06 ENCOUNTER — Ambulatory Visit: Admitting: Urology

## 2024-07-19 ENCOUNTER — Other Ambulatory Visit (HOSPITAL_COMMUNITY): Payer: Self-pay

## 2024-07-19 ENCOUNTER — Other Ambulatory Visit: Payer: Self-pay
